# Patient Record
Sex: Male | Born: 1965 | Race: White | Hispanic: No | Marital: Married | Smoking: Never smoker
Health system: Southern US, Community
[De-identification: ages and names within clinical notes are randomized; demographics above are authoritative.]

## PROBLEM LIST (undated history)

## (undated) DIAGNOSIS — K219 Gastro-esophageal reflux disease without esophagitis: Secondary | ICD-10-CM

## (undated) DIAGNOSIS — R112 Nausea with vomiting, unspecified: Secondary | ICD-10-CM

## (undated) DIAGNOSIS — E785 Hyperlipidemia, unspecified: Secondary | ICD-10-CM

## (undated) DIAGNOSIS — M549 Dorsalgia, unspecified: Secondary | ICD-10-CM

## (undated) DIAGNOSIS — K449 Diaphragmatic hernia without obstruction or gangrene: Secondary | ICD-10-CM

## (undated) DIAGNOSIS — M5137 Other intervertebral disc degeneration, lumbosacral region: Secondary | ICD-10-CM

## (undated) DIAGNOSIS — J45909 Unspecified asthma, uncomplicated: Secondary | ICD-10-CM

## (undated) DIAGNOSIS — Z9889 Other specified postprocedural states: Secondary | ICD-10-CM

## (undated) DIAGNOSIS — IMO0002 Reserved for concepts with insufficient information to code with codable children: Secondary | ICD-10-CM

## (undated) HISTORY — DX: Hyperlipidemia, unspecified: E78.5

## (undated) HISTORY — PX: BACK SURGERY: SHX140

## (undated) HISTORY — DX: Diaphragmatic hernia without obstruction or gangrene: K44.9

## (undated) HISTORY — DX: Other intervertebral disc degeneration, lumbosacral region: M51.37

## (undated) HISTORY — PX: SHOULDER ARTHROSCOPY: SHX128

## (undated) HISTORY — PX: TONSILLECTOMY: SUR1361

## (undated) HISTORY — DX: Gastro-esophageal reflux disease without esophagitis: K21.9

## (undated) HISTORY — DX: Reserved for concepts with insufficient information to code with codable children: IMO0002

---

## 1992-11-22 HISTORY — PX: KNEE CARTILAGE SURGERY: SHX688

## 1996-11-22 HISTORY — PX: ANKLE ARTHROSCOPY: SUR85

## 2000-01-29 ENCOUNTER — Encounter: Admission: RE | Admit: 2000-01-29 | Discharge: 2000-04-28 | Payer: Self-pay | Admitting: Anesthesiology

## 2002-01-04 ENCOUNTER — Inpatient Hospital Stay (HOSPITAL_COMMUNITY): Admission: RE | Admit: 2002-01-04 | Discharge: 2002-01-05 | Payer: Self-pay | Admitting: Neurosurgery

## 2002-01-04 ENCOUNTER — Encounter: Payer: Self-pay | Admitting: Neurosurgery

## 2002-02-14 ENCOUNTER — Encounter: Payer: Self-pay | Admitting: Neurosurgery

## 2002-02-14 ENCOUNTER — Encounter: Admission: RE | Admit: 2002-02-14 | Discharge: 2002-02-14 | Payer: Self-pay | Admitting: Neurosurgery

## 2002-03-29 ENCOUNTER — Encounter: Admission: RE | Admit: 2002-03-29 | Discharge: 2002-03-29 | Payer: Self-pay | Admitting: Neurosurgery

## 2002-03-29 ENCOUNTER — Encounter: Payer: Self-pay | Admitting: Neurosurgery

## 2002-06-06 ENCOUNTER — Encounter: Payer: Self-pay | Admitting: Neurosurgery

## 2002-06-06 ENCOUNTER — Ambulatory Visit (HOSPITAL_COMMUNITY): Admission: RE | Admit: 2002-06-06 | Discharge: 2002-06-06 | Payer: Self-pay | Admitting: Neurosurgery

## 2003-09-26 ENCOUNTER — Encounter: Admission: RE | Admit: 2003-09-26 | Discharge: 2003-09-26 | Payer: Self-pay | Admitting: Neurosurgery

## 2003-10-11 ENCOUNTER — Encounter: Admission: RE | Admit: 2003-10-11 | Discharge: 2003-10-11 | Payer: Self-pay | Admitting: Neurosurgery

## 2003-11-04 ENCOUNTER — Encounter: Admission: RE | Admit: 2003-11-04 | Discharge: 2003-11-04 | Payer: Self-pay | Admitting: Neurosurgery

## 2004-01-08 ENCOUNTER — Encounter: Admission: RE | Admit: 2004-01-08 | Discharge: 2004-01-08 | Payer: Self-pay | Admitting: Neurosurgery

## 2004-05-19 ENCOUNTER — Encounter
Admission: RE | Admit: 2004-05-19 | Discharge: 2004-07-24 | Payer: Self-pay | Admitting: Physical Medicine and Rehabilitation

## 2004-07-24 ENCOUNTER — Encounter
Admission: RE | Admit: 2004-07-24 | Discharge: 2004-09-28 | Payer: Self-pay | Admitting: Physical Medicine and Rehabilitation

## 2004-07-29 ENCOUNTER — Ambulatory Visit: Payer: Self-pay | Admitting: Physical Medicine and Rehabilitation

## 2004-09-01 ENCOUNTER — Ambulatory Visit: Payer: Self-pay | Admitting: Anesthesiology

## 2004-09-11 ENCOUNTER — Ambulatory Visit: Payer: Self-pay | Admitting: Physical Medicine and Rehabilitation

## 2004-09-28 ENCOUNTER — Encounter: Admission: RE | Admit: 2004-09-28 | Discharge: 2004-12-09 | Payer: Self-pay | Admitting: Neurosurgery

## 2004-10-20 ENCOUNTER — Ambulatory Visit: Payer: Self-pay | Admitting: Anesthesiology

## 2004-12-09 ENCOUNTER — Encounter: Admission: RE | Admit: 2004-12-09 | Discharge: 2005-02-17 | Payer: Self-pay | Admitting: Family Medicine

## 2004-12-11 ENCOUNTER — Ambulatory Visit: Payer: Self-pay | Admitting: Physical Medicine and Rehabilitation

## 2005-01-22 ENCOUNTER — Ambulatory Visit: Payer: Self-pay | Admitting: Physical Medicine and Rehabilitation

## 2005-04-07 ENCOUNTER — Ambulatory Visit: Payer: Self-pay | Admitting: Physical Medicine and Rehabilitation

## 2005-04-20 ENCOUNTER — Ambulatory Visit: Payer: Self-pay | Admitting: Anesthesiology

## 2005-05-03 ENCOUNTER — Encounter
Admission: RE | Admit: 2005-05-03 | Discharge: 2005-08-01 | Payer: Self-pay | Admitting: Physical Medicine and Rehabilitation

## 2005-06-08 ENCOUNTER — Ambulatory Visit: Payer: Self-pay | Admitting: Physical Medicine and Rehabilitation

## 2005-07-27 ENCOUNTER — Ambulatory Visit: Payer: Self-pay | Admitting: Physical Medicine and Rehabilitation

## 2005-08-03 ENCOUNTER — Encounter: Admission: RE | Admit: 2005-08-03 | Discharge: 2005-08-03 | Payer: Self-pay | Admitting: Neurosurgery

## 2005-08-25 ENCOUNTER — Encounter
Admission: RE | Admit: 2005-08-25 | Discharge: 2005-11-23 | Payer: Self-pay | Admitting: Physical Medicine and Rehabilitation

## 2005-09-14 ENCOUNTER — Ambulatory Visit: Payer: Self-pay | Admitting: Family Medicine

## 2005-10-11 ENCOUNTER — Ambulatory Visit: Payer: Self-pay | Admitting: Family Medicine

## 2005-11-02 ENCOUNTER — Ambulatory Visit: Payer: Self-pay | Admitting: Family Medicine

## 2005-12-02 ENCOUNTER — Ambulatory Visit: Payer: Self-pay | Admitting: Family Medicine

## 2006-04-29 ENCOUNTER — Ambulatory Visit: Payer: Self-pay | Admitting: Family Medicine

## 2006-06-22 ENCOUNTER — Ambulatory Visit: Payer: Self-pay | Admitting: Family Medicine

## 2006-07-26 ENCOUNTER — Ambulatory Visit: Payer: Self-pay | Admitting: Family Medicine

## 2006-08-30 DIAGNOSIS — K449 Diaphragmatic hernia without obstruction or gangrene: Secondary | ICD-10-CM | POA: Insufficient documentation

## 2006-08-30 DIAGNOSIS — K219 Gastro-esophageal reflux disease without esophagitis: Secondary | ICD-10-CM

## 2006-08-30 DIAGNOSIS — E669 Obesity, unspecified: Secondary | ICD-10-CM

## 2006-08-30 HISTORY — DX: Gastro-esophageal reflux disease without esophagitis: K21.9

## 2006-08-30 HISTORY — DX: Diaphragmatic hernia without obstruction or gangrene: K44.9

## 2006-09-02 ENCOUNTER — Ambulatory Visit: Payer: Self-pay | Admitting: Family Medicine

## 2006-12-13 ENCOUNTER — Ambulatory Visit: Payer: Self-pay | Admitting: Family Medicine

## 2006-12-13 ENCOUNTER — Encounter: Payer: Self-pay | Admitting: Family Medicine

## 2007-05-01 ENCOUNTER — Telehealth: Payer: Self-pay | Admitting: Family Medicine

## 2007-05-23 ENCOUNTER — Telehealth (INDEPENDENT_AMBULATORY_CARE_PROVIDER_SITE_OTHER): Payer: Self-pay | Admitting: *Deleted

## 2007-05-24 ENCOUNTER — Ambulatory Visit: Payer: Self-pay | Admitting: Family Medicine

## 2007-06-06 ENCOUNTER — Telehealth: Payer: Self-pay | Admitting: Family Medicine

## 2007-06-07 ENCOUNTER — Encounter: Payer: Self-pay | Admitting: Family Medicine

## 2007-06-16 ENCOUNTER — Ambulatory Visit: Payer: Self-pay | Admitting: Physical Medicine & Rehabilitation

## 2007-07-14 ENCOUNTER — Encounter
Admission: RE | Admit: 2007-07-14 | Discharge: 2007-08-22 | Payer: Self-pay | Admitting: Physical Medicine & Rehabilitation

## 2007-09-11 ENCOUNTER — Telehealth: Payer: Self-pay | Admitting: Family Medicine

## 2007-09-15 ENCOUNTER — Telehealth: Payer: Self-pay | Admitting: Family Medicine

## 2007-12-28 ENCOUNTER — Ambulatory Visit: Payer: Self-pay | Admitting: Family Medicine

## 2007-12-28 DIAGNOSIS — M5137 Other intervertebral disc degeneration, lumbosacral region: Secondary | ICD-10-CM

## 2007-12-28 DIAGNOSIS — M51379 Other intervertebral disc degeneration, lumbosacral region without mention of lumbar back pain or lower extremity pain: Secondary | ICD-10-CM

## 2007-12-28 DIAGNOSIS — M5136 Other intervertebral disc degeneration, lumbar region: Secondary | ICD-10-CM

## 2007-12-28 HISTORY — DX: Other intervertebral disc degeneration, lumbosacral region: M51.37

## 2007-12-28 HISTORY — DX: Other intervertebral disc degeneration, lumbosacral region without mention of lumbar back pain or lower extremity pain: M51.379

## 2007-12-29 ENCOUNTER — Encounter: Payer: Self-pay | Admitting: Family Medicine

## 2007-12-29 LAB — CONVERTED CEMR LAB
ALT: 15 units/L (ref 0–53)
AST: 13 units/L (ref 0–37)
Albumin: 4.5 g/dL (ref 3.5–5.2)
Alkaline Phosphatase: 67 units/L (ref 39–117)
BUN: 22 mg/dL (ref 6–23)
CO2: 22 meq/L (ref 19–32)
Calcium: 9.3 mg/dL (ref 8.4–10.5)
Chloride: 107 meq/L (ref 96–112)
Cholesterol: 250 mg/dL — ABNORMAL HIGH (ref 0–200)
Creatinine, Ser: 1.09 mg/dL (ref 0.40–1.50)
Glucose, Bld: 100 mg/dL — ABNORMAL HIGH (ref 70–99)
HDL: 51 mg/dL (ref >39)
LDL Cholesterol: 158 mg/dL — ABNORMAL HIGH (ref 0–99)
Potassium: 4.4 meq/L (ref 3.5–5.3)
Sodium: 141 meq/L (ref 135–145)
Total Bilirubin: 0.5 mg/dL (ref 0.3–1.2)
Total CHOL/HDL Ratio: 4.9
Total Protein: 7 g/dL (ref 6.0–8.3)
Triglycerides: 204 mg/dL — ABNORMAL HIGH (ref <150)
VLDL: 41 mg/dL — ABNORMAL HIGH (ref 0–40)

## 2008-01-01 ENCOUNTER — Encounter: Payer: Self-pay | Admitting: Family Medicine

## 2008-01-01 ENCOUNTER — Telehealth (INDEPENDENT_AMBULATORY_CARE_PROVIDER_SITE_OTHER): Payer: Self-pay | Admitting: *Deleted

## 2008-01-08 ENCOUNTER — Encounter: Admission: RE | Admit: 2008-01-08 | Discharge: 2008-01-08 | Payer: Self-pay | Admitting: Family Medicine

## 2008-01-08 ENCOUNTER — Telehealth: Payer: Self-pay | Admitting: Family Medicine

## 2008-01-09 ENCOUNTER — Telehealth: Payer: Self-pay | Admitting: Family Medicine

## 2008-01-10 ENCOUNTER — Encounter: Payer: Self-pay | Admitting: Family Medicine

## 2008-01-15 ENCOUNTER — Telehealth: Payer: Self-pay | Admitting: Family Medicine

## 2008-01-19 ENCOUNTER — Ambulatory Visit: Payer: Self-pay | Admitting: Family Medicine

## 2008-01-19 DIAGNOSIS — E785 Hyperlipidemia, unspecified: Secondary | ICD-10-CM

## 2008-01-19 HISTORY — DX: Hyperlipidemia, unspecified: E78.5

## 2008-01-24 ENCOUNTER — Telehealth: Payer: Self-pay | Admitting: Family Medicine

## 2008-02-09 ENCOUNTER — Telehealth: Payer: Self-pay | Admitting: Family Medicine

## 2008-08-19 ENCOUNTER — Ambulatory Visit: Payer: Self-pay | Admitting: Family Medicine

## 2008-08-19 DIAGNOSIS — IMO0002 Reserved for concepts with insufficient information to code with codable children: Secondary | ICD-10-CM

## 2008-08-19 HISTORY — DX: Reserved for concepts with insufficient information to code with codable children: IMO0002

## 2008-08-22 ENCOUNTER — Encounter: Admission: RE | Admit: 2008-08-22 | Discharge: 2008-08-22 | Payer: Self-pay | Admitting: Family Medicine

## 2008-08-23 ENCOUNTER — Telehealth: Payer: Self-pay | Admitting: Family Medicine

## 2008-08-27 ENCOUNTER — Encounter: Payer: Self-pay | Admitting: Family Medicine

## 2008-08-28 ENCOUNTER — Telehealth: Payer: Self-pay | Admitting: Family Medicine

## 2008-09-02 ENCOUNTER — Ambulatory Visit: Payer: Self-pay | Admitting: Family Medicine

## 2008-09-02 ENCOUNTER — Emergency Department (HOSPITAL_COMMUNITY): Admission: EM | Admit: 2008-09-02 | Discharge: 2008-09-02 | Payer: Self-pay | Admitting: Emergency Medicine

## 2008-09-02 ENCOUNTER — Telehealth: Payer: Self-pay | Admitting: Family Medicine

## 2008-09-04 ENCOUNTER — Encounter: Payer: Self-pay | Admitting: Family Medicine

## 2008-09-05 ENCOUNTER — Telehealth: Payer: Self-pay | Admitting: Family Medicine

## 2008-10-21 ENCOUNTER — Telehealth (INDEPENDENT_AMBULATORY_CARE_PROVIDER_SITE_OTHER): Payer: Self-pay | Admitting: *Deleted

## 2008-11-23 ENCOUNTER — Ambulatory Visit: Payer: Self-pay | Admitting: Occupational Medicine

## 2009-01-06 ENCOUNTER — Encounter: Admission: RE | Admit: 2009-01-06 | Discharge: 2009-01-06 | Payer: Self-pay | Admitting: Family Medicine

## 2009-01-06 ENCOUNTER — Ambulatory Visit: Payer: Self-pay | Admitting: Family Medicine

## 2009-01-08 ENCOUNTER — Encounter: Payer: Self-pay | Admitting: Family Medicine

## 2009-01-09 ENCOUNTER — Telehealth: Payer: Self-pay | Admitting: Family Medicine

## 2010-06-05 ENCOUNTER — Ambulatory Visit: Payer: Self-pay | Admitting: Family Medicine

## 2010-12-12 ENCOUNTER — Encounter: Payer: Self-pay | Admitting: Anesthesiology

## 2010-12-13 ENCOUNTER — Encounter: Payer: Self-pay | Admitting: Physical Medicine and Rehabilitation

## 2010-12-13 ENCOUNTER — Encounter: Payer: Self-pay | Admitting: Neurosurgery

## 2010-12-13 ENCOUNTER — Encounter: Payer: Self-pay | Admitting: Family Medicine

## 2011-04-06 NOTE — Procedures (Signed)
NAMEBALDO, Bryan Harvey         ACCOUNT NO.:  [id number]   MEDICAL RECORD NO.:  [id number]          PATIENT TYPE:  REC   LOCATION:  [facility]                          FACILITY:  [facility]   PHYSICIAN:  Erick Colace, M.D.DATE OF BIRTH:  12/19/1965   DATE OF PROCEDURE:  07/17/2007  DATE OF DISCHARGE:                               OPERATIVE REPORT   This is bilateral L5 dorsal ramus injection, bilateral L4 medial branch  block, bilateral L3 medial branch block under fluoroscopic guidance.   INDICATION:  Lumbar facet mediated pain with partial good relief from  lumbar facet radiofrequency neurotomy, but this was over 2 years ago.     The pain is only partially responsive to medication management including  narcotic analgesics.   Informed consent was obtained after describing risks and benefits of the  procedure to the patient.  These include bleeding, bruising, infection,  loss of bowel and bladder function, temporary or permanent paralysis.  She elects to proceed and has given written consent.   The patient placed prone on fluoroscopy table.  Betadine prep, sterile  drape.  A 25-gauge, 1.5-inch needle was used to incise skin and  subcutaneous tissue with 1% lidocaine x2 mL.  Then, a 22-gauge 3.5-inch  spinal needle was inserted under fluoroscopic guidance, first targeting  left S1 SAP sacral ala junction.  Bone contact made, confirmed with  lateral imaging.  Omnipaque 180 x 0.5 mL demonstrated no intravascular  uptake.  Then 0.5 mL of a solution containing 1 mL of 4 mg/mL  dexamethasone and 2 mL of 2% MPF lidocaine were injected.  Then, the  left L5 SAP transverse process junction targeted.  Bone contact made,  confirmed with lateral imaging.  Omnipaque 180 x 0.5 mL demonstrated no  intravascular uptake, and 0.5 mL of dexamethasone/lidocaine solution  injected.  Then, the left L4 SAP transverse process junction targeted.  Bone contact made, confirmed with lateral imaging.   Omnipaque 180 x 0.5  mL demonstrated no intravascular uptake.  Then, 0.5 mL of the  dexamethasone/lidocaine solution injected.  This same procedure was  repeated on the right side using the same technique, equipment and  injectate.  The patient tolerated the procedure well.  Pre-injection  pain level 7/10.  Post-injection pain level 0/10.  Post injection  instructions given.  Return for radiofrequency neurotomy in 4 weeks.      Erick Colace, M.D.  Electronically Signed     AEK/MEDQ  D:  07/17/2007 13:41:41  T:  07/18/2007 08:36:16  Job:  [id number]

## 2011-04-09 NOTE — Procedures (Signed)
NAMEORLYN, ODONOGHUE         ACCOUNT NO.:  [id number]   MEDICAL RECORD NO.:  [id number]          PATIENT TYPE:  REC   LOCATION:  [facility]                          FACILITY:  [facility]   PHYSICIAN:  Celene Kras, MD        DATE OF BIRTH:  [birth year]   DATE OF PROCEDURE:  11/10/2004  DATE OF DISCHARGE:                                 OPERATIVE REPORT   PATIENT:  Bryan Harvey.   DATE OF BIRTH:  [birth year].   SURGEON:  Jewel Baize. Stevphen Rochester, M.D.   Shandon Burlingame [facility] to [facility], notably  improved to the right side; left side is most problematic. I am going to go  head and pursue with left sided radiofrequency neural ablation, as it is  warranted, demonstrated positive provocative block. Most problematic site is  improved. Will go ahead and address the left side. Risks, complications, and  options are reinforced. He wishes to proceed. His function is increased, he  has decreased his medication usage, and overall improved in his functional  parameters.   OBJECTIVE:  Diffuse paralumbar myofascial discomfort, pain over PSIS and  notable pain on extension, but he does not have any new neurological  findings, motor, sensory or reflexive.   IMPRESSION:  Degenerative spine disease, lumbar spine, facet syndrome.   PLAN:  Facet neurotomy, left side, L5-S1, 4-5, 3-4, 2-3 independent needle  access points under local anesthetic, 10-mm active tip. He has consented.   The patient was taken to the fluoroscopy suite and placed in the prone  position. Back prepped and draped in usual sterile fashion. Using a 22-gauge  RF needle, I advanced under direct fluoroscopic observation of the facet at  the medial branch L5-S1, 4-5, 3-4, 2-3 independent needle access points  under local anesthetic and confirmed placement. I appropriately stimulated  motor and sensory and reconfirmed needle placement at multiple points during  the procedure. I then injected 1 cc of  lidocaine 1% MPF at each level with a  total of 40 mg of Aristocort in divided doses.   Lesion is performed at 70 degrees for 70 seconds at each level.   He tolerated the procedure well. No complication from the procedure.  Appropriate recovery. Discharge instructions given. Lifestyle enhancements  reviewed.       HH/MEDQ  D:  11/10/2004 11:05:00  T:  11/11/2004 11:16:50  Job:  [id number]

## 2011-04-09 NOTE — Assessment & Plan Note (Signed)
MEDICAL RECORD #[id number]   REASON FOR VISIT:  Bryan Harvey is a 45 year old married white gentleman who  is being seen in our pain and rehabilitative clinic for chronic low-back  pain. He is status post bilateral decompressive laminectomy and  microdiskectomy and Ray cage at L5-S1.   He has undergone medial branch blocks of his lumbar spine by Dr. Stevphen Rochester over  the last several months. He has done well with these. He is back in today.  He has had some increased pain in the low-back area, gradually coming on. No  radiation into the buttock or the legs, not associated with any kind of  fever or chills. No problems controlling bowel or bladder. No new numbness,  tingling, weakness.   The patient's functional status remains quite active. He works full-time. He  has two young children he helps take care when he is home. We recently  decreased his medication. He was taking Topamax 50 mg twice a day as well as  Norco 10 mg up to five times a day. He was reduced over the last several  weeks to Norco 7.5 three times a day and Topamax 25 mg twice a day. He has  not done well with this reduction in his medications.   We spent about 25 minutes talking today, reviewing pain management, flare-up  protocols.   He is currently not engaging in any stabilization exercises at this point.  He never got a TENS unit for his low-back pain and he does not regularly use  the lumbar support when he is doing some bending and twisting/lifting type  activities.   He reports his average pain today as about a 7 on a scale of 10. Sleep is  fair and relief from medications is fair. Pain is described as constant and  aching. Functional status as noted above.   No change in medical, social, or family history since last visit.   PHYSICAL EXAMINATION:  Blood pressure 125/80, pulse 101, respirations 16,  97% saturated on room air.  He is a well-developed, well-nourished  gentleman. He is oriented x3. Affect is bright,  alert, cooperative,  pleasant. He has normal gait.   IMPRESSION:  1.  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  2.  Facet syndrome.  3.  Status post bilateral decompressive laminectomy with microdiskectomy,      Ray cage at L5-S1.   PLAN:  Will continue hydrocodone 7.5 three times a day. Will also continue  Topamax 25 mg one p.o. b.i.d. Will add Ultracet 100 mg extended-release one  tablet p.o. daily for 8 days. He was given samples for this.   He will call in over the next week and let us know if he would like [place] to  call this in for him. May also consider increasing Topamax to 50 mg twice a  day. Will get him started back into a spine stabilization program formally  in a physical therapy environment. Would like him to go once a week over the  next 6-8 weeks and continue his program  at home as well. Would like him to use a lumbar support during activities.  Would also like him assessed for a TENS unit. Will see him back in 1 month.       DMK/MedQ  D:  06/24/2005 13:40:18  T:  06/24/2005 14:15:33  Job #:  [id number]

## 2011-04-09 NOTE — Op Note (Signed)
[place]  Patient:    HILDING, QUINTANAR Visit Number: [id number] MRN: [id number]          Service Type: SUR Location: 3000 [id number] [id number] Attending Physician:  Gerald Dexter Dictated by:   Reinaldo Meeker, M.D. Proc. Date: 01/04/02 Admit Date:  01/04/2002                             Operative Report  PREOPERATIVE DIAGNOSIS:  Degenerative disk disease with herniated disk, L5-S1.  POSTOPERATIVE DIAGNOSIS:  Degenerative disk disease with herniated disk, L5-S1.  OPERATION PERFORMED:  Bilateral L5-S1 decompressive laminectomy followed by bilateral microdiskectomy followed by Ray cage interbody fusion with 12 x 26 mm Ray cages with local bone graft.  SURGEON:  Reinaldo Meeker, M.D.  ASSISTANT:  Julio Sicks, M.D.  ANESTHESIA:  DESCRIPTION OF PROCEDURE:  After being placed in the prone position, the patients back was prepped and draped in the usual sterile fashion. Localizing x-ray was taken prior to incision to identify the appropriate level.  A midline incision was made above the spinous processes of L5 and S1. Using the Bovie cutting current, the incision was carried down to the spinous processes.  Subperiosteal dissection was then carried out bilaterally on the spinous processes and lamina and the McCullough self-retaining retractor was placed for exposure.  X-ray confirmed approach to the appropriate level. Spinous processes and interspinous ligament were removed.  A high speed drill was then used to perform generous laminotomies bilaterally by removing the inferior two thirds of the L5 lamina, the medial two thirds of the facet joint and the superior one half of the S1 segment.  Residual bone was removed and saved for use in the cages at the end of the case.  The ligamentum flavum was removed in a piecemeal fashion.  The midline structures were removed at this time to complete the bilateral decompressive laminectomy.  At this  point microdissection technique was used bilaterally to clean out the L5-S1 disk and coagulate on the annulus, incising the disk and clean out thoroughly with pituitary rongeurs and curets.  A very thorough disk space clean-out was carried out.  At the same time great care was taken to avoid injury to the neural elements and this was successfully done.  At this point fluoroscopy was brought back into the case.  Under fluoroscopic guidance, a 14 mm Tang retractor was placed.  14 x 26 mm cages were placed after drilling and tapping under fluoroscopic guidance.  The cages were confirmed to be in excellent position by fluoroscopy bilaterally and there was no evidence of any injury to the neural elements.  At this point final fluoroscopy in the AP and lateral directions showed the cages to be in good position.  The wound was then irrigated copiously and any bleeding controlled with bipolar coagulation and Gelfoam.  The wound was then closed using interrupted Vicryl in the muscle, fascia, subcutaneous and subcuticular tissues and Dermabond and Steri-Strips on the skin.  A sterile dressing was then applied and the patient was extubated and taken to recovery room in stable condition. Dictated by:   Reinaldo Meeker, M.D. Attending Physician:  Gerald Dexter DD:  01/04/02 TD:  01/04/02 Job: [id number]
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
## 2011-04-09 NOTE — Assessment & Plan Note (Signed)
HISTORY OF PRESENT ILLNESS:  Bryan Harvey is a 45 year old married white  gentleman who is being seen in our pain and rehabilitation clinic for  chronic low back pain. He is status post bilateral decompressive laminectomy  and microdiskectomy at L5-S1. He has undergone medial branch block and facet  neurotomy by Dr. Stevphen Rochester over the last several months. He has done well with  this. He is back in today. He has some good days and some bad days. The pain  is typically located in the low back area. Worse with extension type of  activities and sometimes, worse with prolonged sitting. His average pain is  about a 7 on a scale of 10. He describes his pain as constant, dull,  stabbing, aching. His sleep is fair. His pain is worse with bending and  sitting type of activities. Improves with rest, medications, and injections.  Relief with medications currently is fair. He is quite functional. He is  independent with all of his self care. He is up a good part and most of the  day and driving with his job. 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  He stays quite active.   REVIEW OF SYSTEMS:  No changes.   PAST MEDICAL HISTORY:  No changes.   SOCIAL/FAMILY HISTORY:  No changes since last visit.   PHYSICAL EXAMINATION:  VITAL SIGNS:  Blood pressure is 149/88, pulse 99,  respiratory rate 18, 100% saturated on room air.  GENERAL:  A well developed, well nourished  gentleman. Does not appear in  any distress during our interview today. He moves easily in the room. He is  able to stand easily.  NEUROLOGIC:  Gait is non-antalgic. Heel toe walking is normal Tandem gait is  normal. Affect is bright, alert, cooperative and pleasant. Reflexes are  symmetric and intact in the lower extremities. Motor strength is excellent.  Straight leg raise is negative.   IMPRESSION:  1.  Degenerative disk of the lumbar spine.  2.  Facet syndrome.  3.  Facet plus decompressive laminectomy with microdiskectomy Ray cage  at L5-      S1.   PLAN:  Will refill hydrocodone 5/325 1 p.o. b.i.d. p.r.n. #60. Will continue  Topamax 25 mg 1 p.o. b.i.d. and will limit the amount of acetaminophen. I  went over this with him again, to less than 2,000 mg a day. He continue to  do physical therapy program. He had been going twice a week for about 6  visits, working out on lumbar stabilization program, education on Agricultural consultant. At this point, we would like him to maybe go just once  or twice a month now and to progress him as he gets stronger in therapy.  Will see him back in a month.           ______________________________  Bryan Harvey, M.D.     DMK/MedQ  D:  07/28/2005 15:31:46  T:  07/29/2005 02:17:51  Job #:  [id number]

## 2011-04-09 NOTE — Assessment & Plan Note (Signed)
DATE OF SERVICE:  Apr 07, 2005.   MEDICAL RECORD NUMBER:  [id number].   [name] is a 45 year old, married, white gentleman who is back in today  for a recheck and refill of his medications.   He is now about five months status post his last RF procedure by Dr. Stevphen Rochester.   He notes that his back pain seems to be worsening recently.   He has been very active.  He has been coaching soccer and baseball.  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  He has stayed very  active.  He has a 43-month-old baby and another child which he helps take  care of.   He admits that he is somewhat sleep deprived because of the baby.   His average pain is about a 6 on a scale of 10, worse with bending, sitting  and twisting-type activities.  Improves with rest, medications and  injections.   Relief from medications currently is fair.   MEDICATIONS:  1.  Norco 10/325 mg one p.o. q.i.d. #120.  2.  Topamax 25 mg one p.o. b.i.d. #60.  3.  Lidoderm p.r.n.  4.  Soma p.r.n.  5.  Naprosyn not more than 10 days a month.   Functional status otherwise is good.  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  He is able to drive and climb stairs without  difficulty.  He is functioning at a fairly high level currently.  No new  problems on health and history form and review of systems.   Denies any new changes in past medical, social or family history.   PHYSICAL EXAMINATION:  Blood pressure 130/97, pulse 90, respirations 16, 98%  saturated on room air.  He is a well-developed adult male in no apparent  distress.  He is oriented x 3.  Affect is bright and alert.  He is  appropriate and cooperative.  He is able to stand without any difficulty.  He appears a little bit stiff as he stands up today.  His gait is  nonantalgic.  Extension increases his pain quite a bit.  Lumbar extension  increases his pain quite a bit.  He has some limitations in lumbar range,  basically in  all planes, however.  Seated reflexes are 2+ at the knees and  ankles.  Toes are downgoing.  There is no clonus noted.  He has normal tone  throughout the lower extremities.  He has excellent strength in the lower  extremities, 5/5 at hip flexors, knee extensors, dorsiflexors, plantar  flexors, EHL and evertors.  Also, he has a negative straight leg raise and  normal sensory exam.   IMPRESSION:  1.  History of bilateral decompressive laminectomy, microdiskectomy and Ray      cage at L5-S1.  2.  Facet syndrome.  3.  Lumbago.   PLAN:  This gentleman continues to function at a very high level.  He has  been appropriate with his Norco and reports that Topamax does help with the  leg pain he gets intermittently.  With taking Topamax, he does not really  get any leg pain at all.   He has been using Naprosyn, Soma, TENS unit and Lidoderm about four days a  month for flare ups.  He has noted worsening of his low back pain recently.  He is asking whether or not it would be okay to undergo radiofrequency  again.  He felt  he got quite a bit of good relief with it.  His activity  level indeed did increase dramatically after his RF back in December.  I  will see him back in a month.  He will follow up with Dr. Stevphen Rochester for an  evaluation for repeat RF.      DMK/MedQ  D:  04/07/2005 14:03:10  T:  04/07/2005 14:50:00  Job #:  [id number]

## 2011-04-09 NOTE — Assessment & Plan Note (Signed)
The [facility] comes to [facility] today to evaluate and review  health and history form, 14-point review of systems.   Although, he has had some pain in the paralumbar position, I think this is  related more to his mechanical pain, and I do think he is a responder to  radiofrequency neuroablation.  His right side mechanical pain has all but  resolved, the site of the radiofrequency neuroablation, and so we are going  to go on to the left side which is problematic.  I have reviewed this with  him.   Another rationale is to minimize escalation of narcotic-based pain  medication.  He wants to interact with his baby, and be as functional as  possible.  He has been out of work for two weeks.  Dr. Trudee Grip next step  is a fusion, with hardware, and he wants to avoid this.   Soma and Lyrica is helping him, and will increase his analgesic capacity  until I can perform radiofrequency neuroablation.  He is most likely a  candidate for adhesiolysis as well.   He is a functional individual, very active and forthright individual.  I  have reviewed these medications, the risks of these medications.  He  understands and accepts.  Hopefully we can move the p.r.n. strategy or non-  narcotic medication alternatives over time.   Objectively, diffuse paralumbar myofascial discomfort primarily mechanical  pain, left greater than right with pain over PSIS.  Straight leg lift found  impaired and he is intact neurologically, motor, sensory, reflexes.   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   PLAN:  Facet neurotomy, left side.  We will see him in followup.  Discharge  instructions given.  Instructed to maintain contact with Dr. Gerlene Fee.       HH/MedQ  D:  10/20/2004 11:23:21  T:  10/20/2004 12:28:21  Job #:  [id number]   cc:   Reinaldo Meeker, M.D.  [address]  [place]  [place] [address]  Fax:  [phone]

## 2011-04-09 NOTE — Procedures (Signed)
NAMEHARVEER, Bryan Harvey         ACCOUNT NO.:  [id number]   MEDICAL RECORD NO.:  [id number]          PATIENT TYPE:  REC   LOCATION:  [facility]                          FACILITY:  [facility]   PHYSICIAN:  Celene Kras, MD        DATE OF BIRTH:  May 26, 1966   DATE OF PROCEDURE:  05/04/2005  DATE OF DISCHARGE:                                 OPERATIVE REPORT   PATIENT:  Bryan Harvey.   MEDICAL RECORD NUMBER:  [id number].   DATE OF BIRTH:  February 24, 1956.   SURGEON:  Jewel Baize. Stevphen Rochester, M.D.   Bryan [facility] to [facility].  I  evaluated him via the health and history form and 14-point review of  systems.   1.  Bryan Harvey comes to [place] today, stating he has had excellent relief      cycling from previous radiofrequency neural ablation and I reviewed the      chart and Dr. Leretha Dykes notes.  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   1.  Other lifestyle enhancements discussed, such as weight control.   1.  The right side seems to be most problematic.  Consider left-side      radiofrequency neural ablation reinforcement if need.  I will stay with      the most problematic side and I have reviewed the risks, complications      and options as we perform another RF to the right side to maintain      continued functional progress.  He will maintain contact with Dr.      Pamelia Hoit as well.  I do not think we need to do a bilateral RF today      and I think that the potential post procedural discomfort will probably      exceed the benefit.   OBJECTIVE:  Diffuse paralumbar myofascial discomfort with pain over the  PSIS, notable pain on extension and Gaenslen's and Patrick's equivocal. No  new neurological findings motor, sensory or reflexive.   IMPRESSION:  1.  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  2.  Facet syndrome.   PLAN:  Facet  neurotomy, right side, L5-S1, 4-5, 3-4 and 2-3 with  contributory innervation addressed.  He has consented.  As I related to him,  added biomechanical stress above the Ray cages and we would expect the  segments above the stabilization to be at risk and we may need to do the  left side if necessary.  Appropriate questions are answered.  No barriers to  communication.   He is consented.   PROCEDURE:  The patient was taken to the fluoroscopy suite and placed in the  prone position.  The back is prepped and draped in the usual fashion.  Using  a 22 gauge RF needle, 10 mm active tip, I advanced to the facet at the  medial branch, L5-S1, 4-5, 3-4 and 2-3, right side, independent needle  access points under local anesthetic.  I confirmed placement in  multiple  fluoroscopic positions.  I appropriately stimulate both motor and sensory  and reconfirm needle placement at multiple points during this procedure and  maintain verbal contact with the patient.   Lidocaine 1 mL 1% MPF is injected at each level with a total of 40 mg of  Aristocort in divided dose.   Lesion is performed at 60 degrees for 60 seconds at each level.   He tolerated this procedure well.  No complication from our procedure.  Appropriate recovery.  Improved at discharge.  Will assess him the context  of activities of daily living.       HH/MEDQ  D:  05/04/2005 10:33:07  T:  05/04/2005 11:44:46  Job:  [id number]

## 2011-04-09 NOTE — Assessment & Plan Note (Signed)
MEDICAL RECORD NUMBER:  [id number].   Bryan Harvey is back in for a second refill of his medications. He is a 45-  year-old married white gentleman who is status post bilateral decompressive  laminectomy, microdiskectomy, ray cage at L5-S1 by Dr. Gerlene Fee. He has  history of some facet arthritis and chronic low back pain and intermittent  bilateral trochanter bursitis.   He describes his average pain as about a 6 on a scale of 10. It is constant  and aching. Sleep is fair. He gets good relief with his medications  currently. Pain is worse with walking, bending, sitting, certain activities.  Pain improves with rest, heat, medications, injections.   He is able to walk about 20 minutes at a time. He is able to climb stairs  and drive. 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 He has  been doing quite a bit driving about 5 hours a day, total 25 hours a week of  driving. She is also coaching soccer and coaching baseball. He feels that  the RF did help somewhat. In fact, he probably increased his activity more  than he should have, doing all of the coaching and increasing the amount he  is walking and hours he is working. ______________ the gains he made with  the RF may have been blunted by his increased activity level. We received a  medication sheet from his insurance company stating that he has multiple  prescribers for hydrocodone propoxyphene over the last several months. On  questioning him on this, apparently he has had some problems with his right  great toe, has undergone some surgical procedures, has had infection, has  been treated with antibiotics for this, and he was prescribed 30 tablets in  January and about 90 tablets in February of a controlled substances from  other providers. We had nursing staff go over the narcotic contract with him  regarding this.   If this continues without our knowledge, we will need to consider having him  referred to another pain  clinic. He will be given a list of clinics he can  chose from.   PAST MEDICAL HISTORY:  Unchanged since last visit.   SOCIAL HISTORY:  Unchanged since last visit.   FAMILY HISTORY:  Unchanged since last visit.   PHYSICAL EXAMINATION:  Blood pressure 109/64, pulse 92, respirations 16, 97%  saturated on room air. He is a well-developed, mildly obese, gentleman who  appears his stated age. He is oriented. His affect is overall bright and  alert. He is cooperative. He sits forward flexed. His position of comfort is  when he is in a forward position. Any type of extension activity still  aggravates him. He is able to stand independently. Gait is normal in the  room. He has limitations with forward flexion as well as extension.  Otherwise reflexes are symmetrically intact. He has excellent strength in  the lower extremities. He has mild tenderness to palpation over the  trochanters; however, when he does extend back, he does have radiation of  pain into the lateral buttock area, and this is with extension. He gets a  neuropathic type pain into this area. I do not believe it is related to the  trochanteric bursitis. He has been in therapy for this.   IMPRESSION:  1.  Status post bilateral decompressive laminectomy, microdiskectomy, ray      cage L5-S1.  2.  Facet syndrome.  3.  Lumbago.  4.  Bilateral trochanteric bursitis,  mild.   We will have him finish up his physical therapy program with lower extremity  stretching. We will have him be seen briefly for a TENS evaluation and  trial. Would like to slowly decrease Norco over the next several months. We  will refill his Norco today, 10/325 one p.o. q.i.d. p.r.n. pain #120. Will  add Naprosyn 500 for flare ups 1 p.o. b.i.d. #60. Will taper his Lyrica 6  mg. He will take 2 tablets at night for the next 3 days and then 1 tablet at  night for the next 4 days after that. Then we will start him on some  Topamax. It may be less sedating. He  may find this more beneficial than the  Lyrica. Start him on 25 mg 1 p.o. q.h.s. #30, next month will increase him  to twice a day. We will add Lidoderm as well 5% one to three patches 12  hours on and 12 hours off #90.   I spent 20 minutes in the room today discussing importance of pacing his  activities and using good body mechanics and flare up protocol of  exacerbation of pain. We will see him back in one month.      DMK/MedQ  D:  02/19/2005 12:07:10  T:  02/19/2005 13:32:22  Job #:  [id number]

## 2011-04-09 NOTE — Assessment & Plan Note (Signed)
HISTORY OF PRESENT ILLNESS:  Bryan [facility] to evaluate Health and History form 14-point review of  systems.   1. We are about six months out of RF, improved function range of motion,      and quality of life indices.  He continues to work, modified schedule,      and he started coaching.  He attributes this to functional enhancements      with the facet neurotomy.  He has both an anterior and posterior      compartment element with post laminectomy syndrome, so we probably      staged this as a bilateral RF as he is less lateralizing more bilateral      pain, and then follow with adhesiolysis.  This may be our best      nonsurgical decision outside of a dorsi column stimulator.  I do not      think a stimulator is warranted at this time as he did not really have      much leg pain.  This is mostly mechanical and elements of post      laminectomy syndrome.  2. Do not believe further imaging or diagnostics are warranted.  3. Other lifestyle enhancements discussed.  Home based therapy reviewed.     OBJECTIVE:  Diffuse para lumbar myofascial discomfort and pain over PSIS,  notable pain on extension, straight leg lifting impaired, left and right.  EHL is fine but no new neurological findings on motor, sensory, reflexes.   IMPRESSION:  Degenerative spinal disease of the lumbar spine, post  laminectomy syndrome, facet syndrome.   PLAN:  1. As he is a responder, he has done very well with neurotomy.  It is      reasonable to go on and reinforce, this time bilaterally.  2. Review the risks, complications and options of this procedure.  3. Another rational performed.  Procedure is to minimize escalation of      controlled substances.  4. I will see him in follow up.  Discharge instructions given.  No barrier      to communication.        HH/MedQ  D:  04/20/2005 11:04:21  T:  04/20/2005 11:36:50  Job #:  [id number]

## 2011-04-09 NOTE — Assessment & Plan Note (Signed)
HISTORY OF PRESENT ILLNESS:  Mr. Garate is a 45 year old married gentleman  who is working full time at this time.   [facility].  He  has undergone a facet neurotomy on May 04, 2005.  He has done well after  this.  His pain scores are down to a 6 on a scale of 10.  He is back to most  of his functional activities, although, he is not playing golf at this  point.  He describes his pain as constant, dull and aching.  Sleep is fair.  Relief with medications is between fair and good at this point.   He is able to be up on his feet all day.  He is driving.  He can climb  stairs.  Working 40 hours a week, independent with all of his self care.  Denies bowel or bladder problems.  Denies suicidal ideations.   No changes in review of systems.   No change in past medical, social or family history since last visit.   PHYSICAL EXAMINATION:  VITAL SIGNS:  Blood pressure 128/74, pulse 100,  respirations 16, 98% saturated on room air.  GENERAL APPEARANCE:  Mr. Coker is a well-developed, well-nourished  gentleman, mildly obese.  He is oriented x3.  His affect is bright, alert,  cooperative, pleasant today.  He is able to stand without any difficulty.  Gait is entirely normal.  He has a fairly good range of motion in the lumbar  spine.  Some discomfort at end range with extension.  Seated reflexes are 2+  at the knees, 2+ at the ankles.  She has excellent motor strength in the  lower extremities.  Straight leg raising is negative.  No sensory deficits  with light touch.   IMPRESSION:  1.  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  2.  Facet syndrome.  3.  Status post bilateral decompressive laminectomy and microdiskectomy in      rib cage at L5-S1.   PLAN:  Mr. Payette is pretty much back to his regular functioning with the  exception of playing golf.  His pain scores are down.  He thinks he is ready  to come off of his  medications.  We will decrease his Topamax over the next  week to one tablet q.h.s. x7 days and then discontinue.  Will also decrease  his Norco from 10/325 one p.o. q.i.d. down to 7.5 one p.o. t.i.d. on a  p.r.n. basis, #90.  Will discontinue his Soma.  At this point, will also  have him return back to our clinic on a p.r.n. basis.  He typically gets  approximately six months relief with his facet neurotomy.  He may follow up  with Dr. Stevphen Rochester directly at his next visit.  He will call and let us known.  Will see him back then p.r.n.       DMK/MedQ  D:  06/10/2005 10:09:10  T:  06/10/2005 12:37:19  Job #:  [id number]

## 2011-04-09 NOTE — Procedures (Signed)
NAMEKELECHI, ORGERON         ACCOUNT NO.:  [id number]   MEDICAL RECORD NO.:  [id number]          PATIENT TYPE:  REC   LOCATION:  [facility]                          FACILITY:  [facility]   PHYSICIAN:  Celene Kras, MD        DATE OF BIRTH:  [birth year]   DATE OF PROCEDURE:  09/01/2004  DATE OF DISCHARGE:                                 OPERATIVE REPORT   PATIENT:  Bryan Harvey.   DATE OF BIRTH:  February 25, 1956.   SURGEON:  Jewel Baize. Stevphen Rochester, M.D.   MEDICAL RECORD NUMBER:  [id number].   [name] [facility] to [facility].  I  evaluated him via health and history form and 14-point review of systems.   1.  The most problematic side is the right side.  We plan radiofrequency      neural ablation after positive provocative block yielded significant      improvement in function, range of motion and quality of life indices,      better endurance, less pain and less medication usage patterns.   1.  Other lifestyle enhancements discussed, such as weight control and home      based therapy.  Consider the contralateral side based on need.   1.  The risks of this procedure, including bleeding, infection, nerve      damage, stroke, seizure, death, neuritis, no relief of pain, increase in      pain and other unpredictable issues related to the medication, were      discussed.   He is consented and wishes to proceed.  No interval changes in neurologic or  musculoskeletal presentation.   OBJECTIVE:  Diffuse paralumbar myofascial discomfort.  Pain over PSIS.  Notable pain on extension, right greater than left.  No new neurological  findings motor, sensory or reflexive.   IMPRESSION:  1.  Degenerative spine disease of lumbar spine.  2.  Facet syndrome.   PLAN:  Facet neurotomy under local anesthetic with 10 mm active tip.  He is  consent.   PROCEDURE:  The patient was taken to the fluoroscopic suite and placed in  the prone position.  His back was prepped and  draped in the usual fashion.  Using a 22 gauge RF needle, I advanced under direct fluoroscopic observation  of the facet at the medial branch, L5-S1, 4-5, 3-4 and 2-3.  Contributory  enervation addressed.  Independent needle access points under local  anesthetic.  Confirmed placement in multiple fluoroscopic positions.  Used  Isovue 200 to appropriately stimulate motor and sensory followed with 1 mL  of lidocaine MPF at each level with a total of 40 mg of Aristocort in  divided dose.   Lesion performed at 70 degrees for 70 seconds at each level.   The patient tolerated the procedure well.  No complications from the  procedure.  Appropriate recovery.  Discharge instructions given.  No  complications identified.  __________ followup.       HH/MEDQ  D:  09/01/2004 09:34:14  T:  09/01/2004 16:11:26  Job:  [id number]

## 2011-04-09 NOTE — Procedures (Signed)
NAME:  DEMAURI, ADVINCULA                   ACCOUNT NO.:  [id number]   MEDICAL RECORD NO.:  [id number]                   PATIENT TYPE:  REC   LOCATION:  [facility]                                  FACILITY:  [facility]   PHYSICIAN:  Celene Kras, MD                     DATE OF BIRTH:  13-Apr-1966   DATE OF PROCEDURE:  07/07/2004  DATE OF DISCHARGE:                                 OPERATIVE REPORT   PATIENT:  Bryan Harvey.   DATE OF BIRTH:  Jul 31, 1966.   SURGEON:  Jewel Baize. Stevphen Rochester, M.D.   B[facility] and I  evaluated him. I have reviewed the Health and history form. I reviewed the  14 point review of systems.   Bryan Harvey has clearly demonstrated positive provocative block with  improvement in function and quality of life indices to the facet at the  medial branch. He is starting to recrudesce prior to moving to consideration  of radiofrequency neural ablation. We will look forward to giving at least 2  injections to assess functional enhancement. We plan L5-S1, 4-5, 3-4,  contributory innervation addressed, right and left side independent needle  access points under local anesthetic. I discussed him with Dr. Pamelia Hoit,  and she assists in the procedure.   I also discussed overall direct care approach with him. He will assess  within the context of activities of daily living.   OBJECTIVE:  Diffuse paralumbar myofascial discomfort, no significant change  in neurological or musculoskeletal presentation. Gaenslen's and Patrick's  equivocal.   IMPRESSION:  Degenerative spine disease of lumbar spine, facet syndrome.   PLAN:  Facet injection, L5-S1, 4-5, 3-4, right and left side, under local  anesthetic, at the medial branch. Independent needle access points. He is  consented.   The patient taken to the fluoroscopy suite, placed in prone position. Back  prepped and draped in the usual fashion. Using a 22-gauge needle, I  advanced  through the facet at the medial branch with a local anesthetic at L5-S1, 4-  5, 3-4, right and left side; independent needle access points. Confirmed  placement. I then injected 1 cc of lidocaine 1% MVP at each level, a total  of 40 mg of Aristocort in divided doses.   Tolerated the procedure well. No complications from the procedure.  Appropriate recovery. Improved at discharge. Discharge instructions given.                                               Celene Kras, MD   HH/MEDQ  D:  07/07/2004 12:13:50  T:  07/08/2004 11:15:07  Job:  [id number]

## 2011-04-09 NOTE — Assessment & Plan Note (Signed)
MEDICAL RECORD NUMBER:  [id number]   DATE OF SERVICE:  December 11, 2004   REASON FOR EVALUATION:  Mr. Mundorf is back in.  He has had bilateral facet  neurotomy over the last several months.  His pain is about a 7 on a scale of  10, still worse with extension, but overall he feels he might be somewhat  better.  He has been taking Roxicodone 50 mg 4 times a day, also Lyrica,  which seems to be helpful; he takes it only in the evening as it makes him  too sedated during the day.  He has also been on Soma.   He denies any problems controlling bowel or bladder.  He denies any suicidal  ideation.   He continues to stay quite functional, the father of 2 children, a 58-month-  old and a 36-year-old.  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  He is able to climb stairs.  He is  currently driving.   He reports his pain to be located in the central low back area, worse with  extension, average pain 7 on a scale of 10, constant and aching, worse in  the evening and morning for him, moderately interferes with his activities  __________ .  He gets good relief from his pain medications at this time.   No changes in his past medical, surgical or family history.   PHYSICAL EXAMINATION:  VITAL SIGNS:  On exam today, blood pressure 118/70,  pulse 88, respirations 20, 98% saturated on room air.  GENERAL:  He is an  alert, oriented, pleasant gentleman, bright in his affect, oriented x3.  NEUROMUSCULAR:  He is able to come off of the exam table; he appears a bit  stiff initially as he gets up and his gait in the room is __________ ,  however, nonantalgic.  He is able to flex forward with mild limitations in  his range.  Extension does increase his back pain dramatically for him.  Seated reflexes are 2+ at the knees, 2+ at the ankles.  Straight leg raise  is negative.  His motor strength is excellent, 5/5 in hip flexors, knee  extensors, wrist flexors,  plantarflexors and EHL.   IMPRESSION:  1.  Status post bilateral decompressive laminectomy and microdiskectomy, Ray      cage at L5-S1, by Dr. Reinaldo Meeker.  2.  Facet syndrome.  3.  Lumbago.   PLAN:  We will discontinue his Roxicodone today, switch him to Kadian 20 mg  1 p.o. q.12 h., #30, Norco 7.5/325 mg 1 p.o. t.i.d. to 4 times daily p.r.n.;  I will give him 56 of those and 30 of the Kadian.  We will see him back in 2  weeks.  We would like to continue to decrease his narcotic pain medications  over the next couple of months.  We will also get him involved in a physical  therapy program which was written for today, 1-3 weeks to 4-6 weeks, lumbar  stabilization, lower extremity flexibility and strengthening, cardiovascular  endurance, maintaining a neutral lumbar spine.  We would like to send a copy  of our note over to Dr. Gerlene Fee.  We will see Mr. Shaddock back in 2  weeks to check his progress.  Most likely, we will reduce his narcotic at  that time.  Cautions are given to him regarding driving and making major  cognitive decisions while taking opioids.  DMK/MedQ  D:  12/11/2004 10:06:50  T:  12/11/2004 10:49:14  Job #:  [id number]   cc:   Reinaldo Meeker, M.D.  [address]  [place]  [place] [address]  Fax: [phone]

## 2011-04-09 NOTE — Assessment & Plan Note (Signed)
MEDICAL RECORD NUMBER:  [id number].   Bryan Harvey is a 45 year old married white gentleman who is being seen in  our pain and rehabilitative clinic for chronic low back pain. He has  undergone radiofrequency bilaterally by Dr. Stevphen Rochester.   Mr. Gracie tells me he continues to have some low back pain. His pain is  variable in the middle of his low back. He describes pain as being about a 6  to 8 on a scale of 10. He also has some lateral thigh pain which he  describes as about a 6 to 8 on a scale of 10. Pain specifically waxes and  wanes throughout the day. In the morning, it is about a 6 to 7; at lunch  time, it is down a little bit; starting to get back toward the evening and  is worse for him in the evening. He gets fair relief from his current  medications. He sleeps fairly well.   No new changes in bowel or bladder. No new numbness, tingling, or weakness.  No suicidal ideation.   PHYSICAL EXAMINATION:  On exam today, he has a blood pressure of 129/74,  respirations of 20, 97% saturated on room air.   His affect is overall bright and alert. He is oriented x3. He is  cooperative. He is able to stand independently from a seated position. His  gait is nonantalgic. He had limitations of forward flexion and extension.  Seated reflexes are 2+ at the patellar tendons, 2+ at the Achilles tendon.  Straight leg raise is negative. He has 5/5 strength at hip flexors, knee  extensors, dorsi flexors, plantar flexors, EHL. He has quite a bit of  tenderness over both trochanters.   IMPRESSION:  1.  Status post bilateral decompressive laminectomy microdiskectomy ray cage      at L5-S1 by Dr. Aliene Beams.  2.  Facet syndrome.  3.  Lumbago.  4.  Bilateral trochanteric bursitis.   PLAN:  Will discontinue his Kadian and start him on Norco 10/325 one p.o.  q.6h. #120. He will take 5 a day for the first week, then 4 a day for week  2, and then 3 a day for the next 2 weeks after that. We will see him  back in  a month. We will also get him involved in physical therapy program to  address the iliotibial tightness as well as trochanteric bursitis with an  ultrasound, stretching program, home program keeping in mind that he needs  to maintain neutral lumbar spine position during these stretching maneuvers.  We will see Bryan Harvey back in a month.      DMK/MedQ  D:  01/22/2005 12:13:38  T:  01/22/2005 15:41:41  Job #:  [id number]

## 2011-05-12 ENCOUNTER — Emergency Department (INDEPENDENT_AMBULATORY_CARE_PROVIDER_SITE_OTHER): Payer: Self-pay

## 2011-05-12 ENCOUNTER — Emergency Department (HOSPITAL_BASED_OUTPATIENT_CLINIC_OR_DEPARTMENT_OTHER)
Admission: EM | Admit: 2011-05-12 | Discharge: 2011-05-12 | Disposition: A | Discharge status: Home / Self Care | Payer: Self-pay | Attending: Emergency Medicine | Admitting: Emergency Medicine

## 2011-05-12 DIAGNOSIS — M76899 Other specified enthesopathies of unspecified lower limb, excluding foot: Secondary | ICD-10-CM | POA: Insufficient documentation

## 2011-05-12 DIAGNOSIS — M25559 Pain in unspecified hip: Secondary | ICD-10-CM

## 2011-05-12 DIAGNOSIS — G8929 Other chronic pain: Secondary | ICD-10-CM | POA: Insufficient documentation

## 2012-04-13 ENCOUNTER — Emergency Department (HOSPITAL_BASED_OUTPATIENT_CLINIC_OR_DEPARTMENT_OTHER)
Admission: EM | Admit: 2012-04-13 | Discharge: 2012-04-13 | Disposition: A | Discharge status: Home / Self Care | Payer: Self-pay | Attending: Emergency Medicine | Admitting: Emergency Medicine

## 2012-04-13 ENCOUNTER — Encounter (HOSPITAL_BASED_OUTPATIENT_CLINIC_OR_DEPARTMENT_OTHER): Payer: Self-pay | Admitting: Family Medicine

## 2012-04-13 DIAGNOSIS — M545 Low back pain, unspecified: Secondary | ICD-10-CM | POA: Insufficient documentation

## 2012-04-13 DIAGNOSIS — M5430 Sciatica, unspecified side: Secondary | ICD-10-CM | POA: Insufficient documentation

## 2012-04-13 DIAGNOSIS — S335XXA Sprain of ligaments of lumbar spine, initial encounter: Secondary | ICD-10-CM | POA: Insufficient documentation

## 2012-04-13 DIAGNOSIS — M79609 Pain in unspecified limb: Secondary | ICD-10-CM | POA: Insufficient documentation

## 2012-04-13 DIAGNOSIS — S39012A Strain of muscle, fascia and tendon of lower back, initial encounter: Secondary | ICD-10-CM

## 2012-04-13 DIAGNOSIS — M5432 Sciatica, left side: Secondary | ICD-10-CM

## 2012-04-13 DIAGNOSIS — X58XXXA Exposure to other specified factors, initial encounter: Secondary | ICD-10-CM | POA: Insufficient documentation

## 2012-04-13 DIAGNOSIS — K219 Gastro-esophageal reflux disease without esophagitis: Secondary | ICD-10-CM | POA: Insufficient documentation

## 2012-04-13 HISTORY — DX: Dorsalgia, unspecified: M54.9

## 2012-04-13 HISTORY — DX: Gastro-esophageal reflux disease without esophagitis: K21.9

## 2012-04-13 MED ORDER — HYDROMORPHONE HCL PF 1 MG/ML IJ SOLN
1.0000 mg | Freq: Once | INTRAMUSCULAR | Status: AC
  Administered 2012-04-13: 1 mg via INTRAMUSCULAR
  Filled 2012-04-13: qty 1

## 2012-04-13 MED ORDER — KETOROLAC TROMETHAMINE 60 MG/2ML IM SOLN
60.0000 mg | Freq: Once | INTRAMUSCULAR | Status: AC
  Administered 2012-04-13: 60 mg via INTRAMUSCULAR
  Filled 2012-04-13: qty 2

## 2012-04-13 MED ORDER — CYCLOBENZAPRINE HCL 10 MG PO TABS: 10.0000 mg | ORAL_TABLET | Freq: Two times a day (BID) | ORAL | Status: AC | PRN

## 2012-04-13 MED ORDER — HYDROCODONE-ACETAMINOPHEN 5-325 MG PO TABS: 1.0000 | ORAL_TABLET | ORAL | Status: AC | PRN

## 2012-04-13 MED ORDER — DIAZEPAM 5 MG PO TABS
5.0000 mg | ORAL_TABLET | Freq: Once | ORAL | Status: AC
  Administered 2012-04-13: 5 mg via ORAL
  Filled 2012-04-13: qty 1

## 2012-04-13 MED ORDER — PREDNISONE 50 MG PO TABS: 50.0000 mg | ORAL_TABLET | Freq: Every day | ORAL | Status: AC

## 2012-04-13 MED ORDER — PREDNISONE 50 MG PO TABS
60.0000 mg | ORAL_TABLET | Freq: Once | ORAL | Status: AC
  Administered 2012-04-13: 60 mg via ORAL
  Filled 2012-04-13: qty 1

## 2012-04-13 NOTE — Discharge Instructions (Signed)
Sciatica Sciatica is a weakness and/or changes in sensation (tingling, jolts, hot and cold, numbness) along the path the sciatic nerve travels. Irritation or damage to lumbar nerve roots is often also referred to as lumbar radiculopathy.  Lumbar radiculopathy (Sciatica) is the most common form of this problem. Radiculopathy can occur in any of the nerves coming out of the spinal cord. The problems caused depend on which nerves are involved. The sciatic nerve is the large nerve supplying the branches of nerves going from the hip to the toes. It often causes a numbness or weakness in the skin and/or muscles that the sciatic nerve serves. It also may cause symptoms (problems) of pain, burning, tingling, or electric shock-like feelings in the path of this nerve. This usually comes from injury to the fibers that make up the sciatic nerve. Some of these symptoms are low back pain and/or unpleasant feelings in the following areas:  From the mid-buttock down the back of the leg to the back of the knee.   And/or the outside of the calf and top of the foot.   And/or behind the inner ankle to the sole of the foot.  CAUSES   Herniated or slipped disc. Discs are the little cushions between the bones in the back.   Pressure by the piriformis muscle in the buttock on the sciatic nerve (Piriformis Syndrome).   Misalignment of the bones in the lower back and buttocks (Sacroiliac Joint Derangement).   Narrowing of the spinal canal that puts pressure on or pinches the fibers that make up the sciatic nerve.   A slipped vertebra that is out of line with those above or beneath it.   Abnormality of the nervous system itself so that nerve fibers do not transmit signals properly, especially to feet and calves (neuropathy).   Tumor (this is rare).  Your caregiver can usually determine the cause of your sciatica and begin the treatment most likely to help you. TREATMENT  Taking over-the-counter painkillers, physical  therapy, rest, exercise, spinal manipulation, and injections of anesthetics and/or steroids may be used. Surgery, acupuncture, and Yoga can also be effective. Mind over matter techniques, mental imagery, and changing factors such as your bed, chair, desk height, posture, and activities are other treatments that may be helpful. You and your caregiver can help determine what is best for you. With proper diagnosis, the cause of most sciatica can be identified and removed. Communication and cooperation between your caregiver and you is essential. If you are not successful immediately, do not be discouraged. With time, a proper treatment can be found that will make you comfortable. HOME CARE INSTRUCTIONS   If the pain is coming from a problem in the back, applying ice to that area for 15 to 20 minutes, 3 to 4 times per day while awake, may be helpful. Put the ice in a plastic bag. Place a towel between the bag of ice and your skin.   You may exercise or perform your usual activities if these do not aggravate your pain, or as suggested by your caregiver.   Only take over-the-counter or prescription medicines for pain, discomfort, or fever as directed by your caregiver.   If your caregiver has given you a follow-up appointment, it is very important to keep that appointment. Not keeping the appointment could result in a chronic or permanent injury, pain, and disability. If there is any problem keeping the appointment, you must call back to this facility for assistance.  SEEK IMMEDIATE MEDICAL CARE   facility for assistance.  SEEK IMMEDIATE MEDICAL CARE IF:    You experience loss of control of bowel or bladder.   You have increasing weakness in the trunk, buttocks, or legs.   There is numbness in any areas from the hip down to the toes.   You have difficulty walking or keeping your balance.   You have any of the above, with fever or forceful vomiting.  Document Released: 11/02/2001 Document Revised: 10/28/2011 Document Reviewed: 06/21/2008  Exit[employer] Patient Information 2012  [employer]    Lumbosacral Strain  Lumbosacral strain is one of the most common causes of back pain. There are many causes of back pain. Most are not serious conditions.  CAUSES   Your backbone (spinal column) is made up of 24 main vertebral bodies, the sacrum, and the coccyx. These are held together by muscles and tough, fibrous tissue (ligaments). Nerve roots pass through the openings between the vertebrae. A sudden move or injury to the back may cause injury to, or pressure on, these nerves. This may result in localized back pain or pain movement (radiation) into the buttocks, down the leg, and into the foot. Sharp, shooting pain from the buttock down the back of the leg (sciatica) is frequently associated with a ruptured (herniated) disk. Pain may be caused by muscle spasm alone.  Your caregiver can often find the cause of your pain by the details of your symptoms and an exam. In some cases, you may need tests (such as X-rays). Your caregiver will work with you to decide if any tests are needed based on your specific exam.  HOME CARE INSTRUCTIONS    Avoid an underactive lifestyle. Active exercise, as directed by your caregiver, is your greatest weapon against back pain.   Avoid hard physical activities (tennis, racquetball, waterskiing) if you are not in proper physical condition for it. This may aggravate or create problems.   If you have a back problem, avoid sports requiring sudden body movements. Swimming and walking are generally safer activities.   Maintain good posture.   Avoid becoming overweight (obese).   Use bed rest for only the most extreme, sudden (acute) episode. Your caregiver will help you determine how much bed rest is necessary.   For acute conditions, you may put ice on the injured area.   Put ice in a plastic bag.   Place a towel between your skin and the bag.   Leave the ice on for 15 to 20 minutes at a time, every 2 hours, or as needed.   After you are improved and more  active, it may help to apply heat for 30 minutes before activities.  See your caregiver if you are having pain that lasts longer than expected. Your caregiver can advise appropriate exercises or therapy if needed. With conditioning, most back problems can be avoided.  SEEK IMMEDIATE MEDICAL CARE IF:    You have numbness, tingling, weakness, or problems with the use of your arms or legs.   You experience severe back pain not relieved with medicines.   There is a change in bowel or bladder control.   You have increasing pain in any area of the body, including your belly (abdomen).   You notice shortness of breath, dizziness, or feel faint.   You feel sick to your stomach (nauseous), are throwing up (vomiting), or become sweaty.   You notice discoloration of your toes or legs, or your feet get very cold.   Your back pain is getting worse.     You have a fever.  MAKE SURE YOU:    Understand these instructions.   Will watch your condition.   Will get help right away if you are not doing well or get worse.  Document Released: 08/18/2005 Document Revised: 10/28/2011 Document Reviewed: 02/07/2009  [employer] Patient Information 2012 [employer]

## 2012-04-13 NOTE — ED Provider Notes (Addendum)
History     CSN: [id number]  Arrival date & time 04/13/12  1401   First MD Initiated Contact with Patient 04/13/12 1507      Chief Complaint  Patient presents with  . Back Pain    (Consider location/radiation/quality/duration/timing/severity/associated sxs/prior treatment) HPI Comments: Patient states the onset of left lower back pain radiating down his hip since yesterday.  He's been doing an increased amount of yard work in heating and notes that he feels like his back has a "catch".  He feels as though he is stuck.  The pain does radiate down his hip and down the lateral aspect of his left thigh.  He is able to ambulate.  No fevers.  No abdominal pain, nausea, vomiting or changes in bowel habits.  Patient does have a history of 3 lumbar surgeries and had similar symptoms in the past.  Patient is a 46 y.o. male presenting with back pain. The history is provided by the patient. No language interpreter was used.  Back Pain  This is a recurrent problem. Pertinent negatives include no chest pain, no fever, no headaches and no abdominal pain.    Past Medical History  Diagnosis Date  . Back pain   . GERD (gastroesophageal reflux disease)     Past Surgical History  Procedure Date  . Back surgery     History reviewed. No pertinent family history.  History  Substance Use Topics  . Smoking status: Never Smoker   . Smokeless tobacco: Not on file  . Alcohol Use: Yes      Review of Systems  Constitutional: Negative.  Negative for fever and chills.  HENT: Negative.   Eyes: Negative.  Negative for discharge and redness.  Respiratory: Negative.  Negative for cough and shortness of breath.   Cardiovascular: Negative.  Negative for chest pain.  Gastrointestinal: Negative.  Negative for nausea, vomiting and abdominal pain.  Genitourinary: Negative.  Negative for hematuria.  Musculoskeletal: Positive for back pain.  Skin: Negative.  Negative for color change and rash.    Neurological: Negative for syncope and headaches.  Hematological: Negative.  Negative for adenopathy.  Psychiatric/Behavioral: Negative.  Negative for confusion.  All other systems reviewed and are negative.    Allergies  Review of patient's allergies indicates no known allergies.  Home Medications  No current outpatient prescriptions on file.  BP 104/82  Pulse 122  Temp(Src) 98.3 F (36.8 C) (Oral)  Resp 20  SpO2 98%  Physical Exam  Nursing note and vitals reviewed. Constitutional: He is oriented to person, place, and time. He appears well-developed and well-nourished.  Non-toxic appearance. He does not have a sickly appearance.  HENT:  Head: Normocephalic and atraumatic.  Eyes: Conjunctivae, EOM and lids are normal. Pupils are equal, round, and reactive to light.  Neck: Trachea normal, normal range of motion and full passive range of motion without pain. Neck supple.  Cardiovascular: Normal rate, regular rhythm and normal heart sounds.   Pulmonary/Chest: Effort normal and breath sounds normal. No respiratory distress. He has no wheezes.  Abdominal: Soft. Normal appearance. He exhibits no distension. There is no tenderness. There is no rebound and no CVA tenderness.  Musculoskeletal: Normal range of motion.       Sensation to light touch is symmetric in both lower legs.  Patient has good plantar and dorsi flexion of his ankles.  No pain to palpation of the left hip.  No pain to palpation of the lumbar spine.  There is tenderness to palpation of  the left iliac crest.  Neurological: He is alert and oriented to person, place, and time. He has normal strength.  Skin: Skin is warm, dry and intact. No rash noted.  Psychiatric: He has a normal mood and affect. His behavior is normal. Thought content normal.    ED Course  Procedures (including critical care time)  Labs Reviewed - No data to display No results found.   No diagnosis found.    MDM  Patient with likely lumbar  strain with mild sciatica symptoms.  Patient has no neurologic deficit at this time.  He is able to ambulate.  Will treat the patient's symptoms with NSAIDs, muscle relaxants, steroids and narcotic pain medication.  Patient will receive medications here and prescriptions for home.  He does have a primary care physician in [place] who he can see for followup next week if his symptoms are not improving.        Nat Christen, MD 04/13/12 1528  4:14 PM Patient symptoms are improved and patient is now ready for discharge.  Nat Christen, MD 04/13/12 [id number]

## 2012-04-13 NOTE — ED Notes (Signed)
Pt c/o left low back pain after working in the yard yesterday. Pt sts pain shooting down left buttock and sts "it feels like I'm stuck".

## 2012-05-04 ENCOUNTER — Emergency Department (HOSPITAL_BASED_OUTPATIENT_CLINIC_OR_DEPARTMENT_OTHER): Payer: Self-pay

## 2012-05-04 ENCOUNTER — Emergency Department (HOSPITAL_BASED_OUTPATIENT_CLINIC_OR_DEPARTMENT_OTHER)
Admission: EM | Admit: 2012-05-04 | Discharge: 2012-05-04 | Disposition: A | Discharge status: Home / Self Care | Payer: Self-pay | Attending: Emergency Medicine | Admitting: Emergency Medicine

## 2012-05-04 ENCOUNTER — Encounter (HOSPITAL_BASED_OUTPATIENT_CLINIC_OR_DEPARTMENT_OTHER): Payer: Self-pay | Admitting: Family Medicine

## 2012-05-04 DIAGNOSIS — K219 Gastro-esophageal reflux disease without esophagitis: Secondary | ICD-10-CM | POA: Insufficient documentation

## 2012-05-04 DIAGNOSIS — M25559 Pain in unspecified hip: Secondary | ICD-10-CM | POA: Insufficient documentation

## 2012-05-04 DIAGNOSIS — M549 Dorsalgia, unspecified: Secondary | ICD-10-CM | POA: Insufficient documentation

## 2012-05-04 MED ORDER — PREDNISONE 10 MG PO TABS: 20.0000 mg | ORAL_TABLET | Freq: Every day | ORAL | Status: DC

## 2012-05-04 NOTE — ED Provider Notes (Signed)
History     CSN: [id number]  Arrival date & time 05/04/12  4098   First MD Initiated Contact with Patient 05/04/12 1052      Chief Complaint  Patient presents with  . Hip Pain    (Consider location/radiation/quality/duration/timing/severity/associated sxs/prior treatment) HPI Patient complaining of right hip pain that began during the night last night. He states he has had bursitis in his hip before and received an injection. He denies any redness or recent trauma. He denies any other medical problems except for reflux. He states he said it takes only medicine for his reflux. He denies any allergies. He has not had any fever, redness, rash, history of diabetes or joint infection. Past Medical History  Diagnosis Date  . Back pain   . GERD (gastroesophageal reflux disease)     Past Surgical History  Procedure Date  . Back surgery     No family history on file.  History  Substance Use Topics  . Smoking status: Never Smoker   . Smokeless tobacco: Not on file  . Alcohol Use: Yes      Review of Systems  All other systems reviewed and are negative.    Allergies  Review of patient's allergies indicates no known allergies.  Home Medications  No current outpatient prescriptions on file.  BP 136/51  Pulse 84  Temp 97.9 F (36.6 C) (Oral)  Resp 16  Ht 6\' 4"  (1.93 m)  Wt 260 lb (117.935 kg)  BMI 31.65 kg/m2  SpO2 99%  Physical Exam  Nursing note and vitals reviewed. Constitutional: He is oriented to person, place, and time. He appears well-developed and well-nourished.  HENT:  Head: Normocephalic and atraumatic.  Eyes: Pupils are equal, round, and reactive to light.  Neck: Normal range of motion. Neck supple.  Pulmonary/Chest: Effort normal and breath sounds normal.  Musculoskeletal:       Patient with tenderness laterally over right hip. There is no erythema or warmth. He has full active range of motion of the joint. In  Neurological: He is alert and oriented  to person, place, and time.  Skin: Skin is warm and dry.  Psychiatric: He has a normal mood and affect.    ED Course  Procedures (including critical care time)  Labs Reviewed - No data to display Dg Hip Complete Right  05/04/2012  *RADIOLOGY REPORT*  Clinical Data: Right hip pain and stiffness.  RIGHT HIP - COMPLETE 2+ VIEW  Comparison: Plain films right hip 05/12/2011.  Findings: Imaged bones, joints and soft tissues appear normal.  IMPRESSION: Negative study.  Original Report Authenticated By: Bernadene Bell. Maricela Curet, M.D.     No diagnosis found.    MDM    Plan patient placed on prednisone. He is referred to Dr. Pearletha Forge. X-Brynn Mulgrew does not show any evidence of acute injury. I have refused reviewed the narcotic database and the patient has received over 130 narcotic pills since May 5 of this year. Patient states that he does receive some pain medicine for his back. Patient advised that this is a large amount of narcotics and we placed on prednisone for his hip this time.     Hilario Quarry, MD 05/04/12 1125

## 2012-05-04 NOTE — ED Notes (Signed)
MD at bedside. 

## 2012-05-04 NOTE — Discharge Instructions (Signed)
Bursitis  Bursitis is a swelling and soreness (inflammation) of a fluid-filled sac (bursa) that overlies and protects a joint. It can be caused by injury, overuse of the joint, arthritis or infection. The joints most likely to be affected are the elbows, shoulders, hips and knees.  HOME CARE INSTRUCTIONS    Apply ice to the affected area for 15 to 20 minutes each hour while awake for 2 days. Put the ice in a plastic bag and place a towel between the bag of ice and your skin.   Rest the injured joint as much as possible, but continue to put the joint through a full range of motion, 4 times per day. (The shoulder joint especially becomes rapidly "frozen" if not used.) When the pain lessens, begin normal slow movements and usual activities.   Only take over-the-counter or prescription medicines for pain, discomfort or fever as directed by your caregiver.   Your caregiver may recommend draining the bursa and injecting medicine into the bursa. This may help the healing process.   Follow all instructions for follow-up with your caregiver. This includes any orthopedic referrals, physical therapy and rehabilitation. Any delay in obtaining necessary care could result in a delay or failure of the bursitis to heal and chronic pain.  SEEK IMMEDIATE MEDICAL CARE IF:    Your pain increases even during treatment.   You develop an oral temperature above 102 F (38.9 C) and have heat and inflammation over the involved bursa.  MAKE SURE YOU:    Understand these instructions.   Will watch your condition.   Will get help right away if you are not doing well or get worse.  Document Released: 11/05/2000 Document Revised: 10/28/2011 Document Reviewed: 10/10/2009  [employer] Patient Information 2012 [employer]

## 2012-05-04 NOTE — ED Notes (Signed)
Pt c/o right hip pain since 4am. Pt reports h/o bursitis in same hip with steroid injection from ortho approx 1 year ago. Pt sts he took 800mg  Ibuprofen. Pt drove self to ED.

## 2013-01-24 ENCOUNTER — Ambulatory Visit (INDEPENDENT_AMBULATORY_CARE_PROVIDER_SITE_OTHER): Payer: 59 | Admitting: Family Medicine

## 2013-01-24 ENCOUNTER — Encounter: Payer: Self-pay | Admitting: Family Medicine

## 2013-01-24 VITALS — BP 109/72 | HR 84 | Ht 76.0 in | Wt 292.0 lb

## 2013-01-24 DIAGNOSIS — L309 Dermatitis, unspecified: Secondary | ICD-10-CM | POA: Insufficient documentation

## 2013-01-24 DIAGNOSIS — G4726 Circadian rhythm sleep disorder, shift work type: Secondary | ICD-10-CM

## 2013-01-24 DIAGNOSIS — L259 Unspecified contact dermatitis, unspecified cause: Secondary | ICD-10-CM

## 2013-01-24 MED ORDER — TRIAMCINOLONE ACETONIDE 0.1 % EX CREA: TOPICAL_CREAM | Freq: Two times a day (BID) | CUTANEOUS | Status: DC

## 2013-01-24 MED ORDER — MODAFINIL 200 MG PO TABS: 200.0000 mg | ORAL_TABLET | Freq: Every day | ORAL | Status: DC

## 2013-01-24 NOTE — Progress Notes (Signed)
CC: Bryan Harvey is a 47 y.o. male is here for Establish Care and Fatigue   Subjective: HPI:  Patient presents to reestablish care  Patient complains of fatigue in the evening during his 6 PM-6 AM shift that occurs 7 days a week every other week. He reports feeling drowsy and wanting to sleep 6 hours into his shift. He has tried caffeine without much improvement. He gets 7 hours of sleep once off of his shift. Regardless of a week on or week off he sleeps the same time every day. He does snore and reports occasional nonrestorative sleeping. He is unsure about stopping breathing during sleep. He reports no trouble with sleepiness prior to taking this new job opportunity/shift. Symptoms are described as moderate in severity and worse the longer he is at work. Nothing else makes better or worse other than above. They seem to be getting no better or worse since he started his job in September  Patient complains of rash on back and proximal arms. It has been present there for a few months. He always gets it during the winter and improves greatly during the spring and summer to where it is absent. It improves with moisturizing creams. It worsens and cold and dry environments. It is mildly itchy. It does not change throughout the day.  Review of Systems - General ROS: negative for - chills, fever, night sweats, weight gain or weight loss Ophthalmic ROS: negative for - decreased vision Psychological ROS: negative for - anxiety or depression ENT ROS: negative for - hearing change, nasal congestion, tinnitus or allergies Hematological and Lymphatic ROS: negative for - bleeding problems, bruising or swollen lymph nodes Breast ROS: negative Respiratory ROS: no cough, shortness of breath, or wheezing Cardiovascular ROS: no chest pain or dyspnea on exertion Gastrointestinal ROS: no abdominal pain, change in bowel habits, or black or bloody stools Genito-Urinary ROS: negative for - genital discharge,  genital ulcers, incontinence or abnormal bleeding from genitals Musculoskeletal ROS: negative for - joint pain or muscle pain Neurological ROS: negative for - headaches or memory loss Dermatological ROS: negative for lumps, mole changes, rash and skin lesion changes other than that described below history of present illness  Past Medical History  Diagnosis Date  . Back pain   . GERD (gastroesophageal reflux disease)   . Hyperlipidemia   . HYPERLIPIDEMIA 01/19/2008    Qualifier: Diagnosis of  By: Thomos Lemons    . GASTROESOPHAGEAL REFLUX, NO ESOPHAGITIS 08/30/2006    Qualifier: Diagnosis of  By: Thomos Lemons    . 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 DISC DISEASE, LUMBAR 12/28/2007    Qualifier: Diagnosis of  By: Thomos Lemons    . BACK PAIN WITH RADICULOPATHY 08/19/2008    Qualifier: Diagnosis of  By: Thomos Lemons       Family History  Problem Relation Age of Onset  . Lymphoma Father   . Diabetes Mother      History  Substance Use Topics  . Smoking status: Never Smoker   . Smokeless tobacco: Not on file  . Alcohol Use: Yes     Objective: Filed Vitals:   01/24/13 1100  BP: 109/72  Pulse: 84    General: Alert and Oriented, No Acute Distress HEENT: Pupils equal, round, reactive to light. Conjunctivae clear.  Moist mucous membranes, pharynx without inflammation nor lesions.  Neck supple without palpable lymphadenopathy nor abnormal masses. Lungs:  Clear to auscultation bilaterally, no wheezing/ronchi/rales.  Comfortable work of breathing. Good air movement. Cardiac: Regular rate and rhythm. Normal S1/S2.  No murmurs, rubs, nor gallops.   Abdomen: Obese soft nontender Extremities: No peripheral edema.  Strong peripheral pulses.  Mental Status: No depression, anxiety, nor agitation. Skin: Eczematous rash of mild severity on the proximal arms and between the shoulder blades  Assessment & Plan: Bryan Harvey was  seen today for establish care and fatigue.  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 Take 1 hour before work shift.  Eczema - triamcinolone cream (KENALOG) 0.1 %; Apply topically 2 (two) times daily.  Shift work sleep disorder: Discussed diagnosis with patient, as he is unable to change his current work situation due to financial reasons we will try Provigil as above. If not improving his symptoms he is open to the idea of a sleep study in the near future. I've asked him to return in 3-4 weeks. Eczema: He is in agreement with me that he is most likely had this for many years and that is uncontrolled at this time, will start triamcinolone cream avoiding the face to be used for flares, moisturizing cream for maintenance.     Return in about 4 weeks (around 02/21/2013).

## 2013-02-21 ENCOUNTER — Encounter: Payer: Self-pay | Admitting: Family Medicine

## 2013-02-21 ENCOUNTER — Ambulatory Visit (INDEPENDENT_AMBULATORY_CARE_PROVIDER_SITE_OTHER): Payer: 59 | Admitting: Family Medicine

## 2013-02-21 VITALS — BP 111/77 | HR 80 | Wt 285.0 lb

## 2013-02-21 DIAGNOSIS — G4726 Circadian rhythm sleep disorder, shift work type: Secondary | ICD-10-CM

## 2013-02-21 MED ORDER — ARMODAFINIL 150 MG PO TABS: ORAL_TABLET | ORAL | Status: DC

## 2013-02-21 NOTE — Progress Notes (Signed)
CC: Bryan Harvey is a 47 y.o. male is here for f/u shift work sleep disorder   Subjective: HPI:  Followup shift work sleep disorder. Since starting Provigil patient reports significant improvement in sleepiness during nighttime shift work. He reports complete resolution of fatigue and sleepiness occurring halfway through shift. He is now able to complete a full shift without any tiredness whatsoever. He denies any change in productivity to the worse. He denies paranoia, mental disturbance, anxiety, depression, sleep disturbance, nor unintentional weight loss. He feels well rested after sleep. He attributes his weight loss to recently starting a cardiovascular exercise program. He denies chest pain, irregular heartbeat, shortness of breath, nausea, abdominal pain.  Even after insurance coverage 1 month supply costs $150   Review Of Systems Outlined In HPI  Past Medical History  Diagnosis Date  . Back pain   . GERD (gastroesophageal reflux disease)   . Hyperlipidemia   . HYPERLIPIDEMIA 01/19/2008    Qualifier: Diagnosis of  By: Thomos Lemons    . GASTROESOPHAGEAL REFLUX, NO ESOPHAGITIS 08/30/2006    Qualifier: Diagnosis of  By: Thomos Lemons    . 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 DISC DISEASE, LUMBAR 12/28/2007    Qualifier: Diagnosis of  By: Thomos Lemons    . BACK PAIN WITH RADICULOPATHY 08/19/2008    Qualifier: Diagnosis of  By: Thomos Lemons       Family History  Problem Relation Age of Onset  . Lymphoma Father   . Diabetes Mother      History  Substance Use Topics  . Smoking status: Never Smoker   . Smokeless tobacco: Not on file  . Alcohol Use: Yes     Objective: Filed Vitals:   02/21/13 1101  BP: 111/77  Pulse: 80    Vital signs reviewed. General: Alert and Oriented, No Acute Distress HEENT: Pupils equal, round, reactive to light. Conjunctivae clear.  External ears unremarkable.  Moist  mucous membranes. Lungs: Clear and comfortable work of breathing, speaking in full sentences without accessory muscle use. Cardiac: Regular rate and rhythm. No murmurs rubs or gallops Neuro: CN II-XII grossly intact, gait normal. Extremities: No peripheral edema.  Strong peripheral pulses.  Mental Status: No depression, anxiety, nor agitation. Logical though process. Skin: Warm and dry.  Assessment & Plan: Bryan Harvey was seen today for f/u shift work sleep disorder.  Diagnoses and associated orders for this visit:  Shift work sleep disorder - Armodafinil 150 MG tablet; One by mouth one hour prior to work shift.    shift work sleep disorder: Controlled, there are coupons available on [employer] and he tells me his research shows this is on formulary at a better price than Provigil. We'll switch to nuvigil. Return in one month for any side effects or ineffectiveness, otherwise if response is similar to Provigil may return in 3 months.   Return in about 3 months (around 05/23/2013).

## 2013-03-12 ENCOUNTER — Encounter: Payer: Self-pay | Admitting: Physician Assistant

## 2013-03-12 ENCOUNTER — Ambulatory Visit (INDEPENDENT_AMBULATORY_CARE_PROVIDER_SITE_OTHER): Payer: 59 | Admitting: Physician Assistant

## 2013-03-12 VITALS — BP 137/82 | HR 106 | Temp 98.1°F | Wt 283.0 lb

## 2013-03-12 DIAGNOSIS — J3489 Other specified disorders of nose and nasal sinuses: Secondary | ICD-10-CM

## 2013-03-12 DIAGNOSIS — J019 Acute sinusitis, unspecified: Secondary | ICD-10-CM

## 2013-03-12 DIAGNOSIS — R0981 Nasal congestion: Secondary | ICD-10-CM

## 2013-03-12 MED ORDER — AMOXICILLIN-POT CLAVULANATE 875-125 MG PO TABS: 1.0000 | ORAL_TABLET | Freq: Two times a day (BID) | ORAL | Status: DC

## 2013-03-12 MED ORDER — MOMETASONE FUROATE 50 MCG/ACT NA SUSP: 2.0000 | Freq: Every day | NASAL | Status: DC

## 2013-03-12 NOTE — Progress Notes (Signed)
  Subjective:    Patient ID: Bryan Harvey, male    DOB: June 30, 1966, 47 y.o.   MRN: [id number]  Sinusitis This is a new problem. The current episode started in the past 7 days. The problem has been gradually worsening since onset. There has been no fever. His pain is at a severity of 6/10. The pain is moderate. Associated symptoms include congestion, coughing, headaches, sinus pressure and a sore throat. Pertinent negatives include no chills, diaphoresis, ear pain, hoarse voice, neck pain, shortness of breath, sneezing or swollen glands. Treatments tried: Mucinex D and nyquil and allegra. The treatment provided mild relief.       Review of Systems  Constitutional: Negative for chills and diaphoresis.  HENT: Positive for congestion, sore throat and sinus pressure. Negative for ear pain, hoarse voice, sneezing and neck pain.   Respiratory: Positive for cough. Negative for shortness of breath.   Neurological: Positive for headaches.       Objective:   Physical Exam  Constitutional: He is oriented to person, place, and time. He appears well-developed and well-nourished.  HENT:  Head: Normocephalic and atraumatic.  Mouth/Throat: Oropharynx is clear and moist.  Right and Left TM with air bubbles and fluid behind ears. No blood or pus.   Turbinates red and swollen.   Tenderness to palpation over frontal sinuses.   Eyes:  Bilateral injected conjunctiva. No discharge.  Neck: Normal range of motion. Neck supple.  Cardiovascular: Normal rate, regular rhythm and normal heart sounds.   Pulmonary/Chest: Effort normal and breath sounds normal. He has no wheezes.  Lymphadenopathy:    He has no cervical adenopathy.  Neurological: He is alert and oriented to person, place, and time.  Skin: Skin is warm and dry.  Psychiatric: He has a normal mood and affect. His behavior is normal.          Assessment & Plan:  Sinusitis/nasal congestion- continue on zyrtec seems there is an allergic  component. Consider sudafed for the next 3-4 days. Start on nasonex. Gave augmentin to start. Discussed sinus rinses.

## 2013-03-12 NOTE — Patient Instructions (Addendum)
Mucinex D twice a day.  Zyrtec/Claritin  Start Augmentin along with nasal spray.

## 2013-03-21 ENCOUNTER — Encounter: Payer: Self-pay | Admitting: Family Medicine

## 2013-03-21 ENCOUNTER — Ambulatory Visit (INDEPENDENT_AMBULATORY_CARE_PROVIDER_SITE_OTHER): Payer: 59 | Admitting: Family Medicine

## 2013-03-21 VITALS — BP 115/73 | HR 84 | Wt 286.0 lb

## 2013-03-21 DIAGNOSIS — H1089 Other conjunctivitis: Secondary | ICD-10-CM

## 2013-03-21 DIAGNOSIS — A499 Bacterial infection, unspecified: Secondary | ICD-10-CM

## 2013-03-21 DIAGNOSIS — H109 Unspecified conjunctivitis: Secondary | ICD-10-CM

## 2013-03-21 MED ORDER — CIPROFLOXACIN HCL 0.3 % OP SOLN: OPHTHALMIC | Status: DC

## 2013-03-21 NOTE — Progress Notes (Signed)
CC: Bryan Harvey is a 47 y.o. male is here for possible conjunctivitis   Subjective: HPI:  Patient complains of redness of the eyes that occurred on Monday. Has not gotten better or worse since onset. Describes redness as moderate in severity. Does not fluctuate in a day nothing makes better or worse. Associated with a itchy sensation of the that improves with over-the-counter lubricant eyedrops for an hour after application. Denies vision loss or ocular pain otherwise. Denies photophobia nor discharge of the eye, denies pain with blinking or movement of the eyes. Denies recent change in contacts. Reports nasal congestion sinus pressure significantly improved since starting Augmentin. Denies fever, chills, swollen lymph nodes, cough, sore throat, facial pain, headaches, dizziness, motor or sensory disturbances.   Review Of Systems Outlined In HPI  Past Medical History  Diagnosis Date  . Back pain   . GERD (gastroesophageal reflux disease)   . Hyperlipidemia   . HYPERLIPIDEMIA 01/19/2008    Qualifier: Diagnosis of  By: Thomos Lemons    . GASTROESOPHAGEAL REFLUX, NO ESOPHAGITIS 08/30/2006    Qualifier: Diagnosis of  By: Thomos Lemons    . 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 DISC DISEASE, LUMBAR 12/28/2007    Qualifier: Diagnosis of  By: Thomos Lemons    . BACK PAIN WITH RADICULOPATHY 08/19/2008    Qualifier: Diagnosis of  By: Thomos Lemons       Family History  Problem Relation Age of Onset  . Lymphoma Father   . Diabetes Mother      History  Substance Use Topics  . Smoking status: Never Smoker   . Smokeless tobacco: Not on file  . Alcohol Use: Yes     Objective: Filed Vitals:   03/21/13 1552  BP: 115/73  Pulse: 84    General: Alert and Oriented, No Acute Distress HEENT: Pupils equal, round, reactive to light. Moderate conjunctival injection closest to the iris.  No photophobia on penlight exam,  anterior chambers without debris bilaterally. External ears unremarkable, canals clear with intact TMs with appropriate landmarks.  Middle ear appears open without effusion. Pink inferior turbinates.  Moist mucous membranes, pharynx without inflammation nor lesions.  Neck supple without palpable lymphadenopathy nor abnormal masses. Lungs: Clear and comfortable work of breathing Cardiac: Regular rate and rhythm.    Extremities: No peripheral edema.  Strong peripheral pulses.  Mental Status: No depression, anxiety, nor agitation. Skin: Warm and dry.  Assessment & Plan: Bryan Harvey was seen today for possible conjunctivitis.  Diagnoses and associated orders for this visit:  Bacterial conjunctivitis - ciprofloxacin (CILOXAN) 0.3 % ophthalmic solution; Administer 1 drop, every 2 hours, while awake, for 2 days. Then 1 drop, every 4 hours, while awake, for the next 5 days.    Snellen chart 20/30 right eye, 20/40 left eye. Discussed with them my concern for bacterial conjunctivitis and given contact use start Cipro for pseudomonal coverage. Discussed signs and symptoms that would require emergent evaluation with ophthalmology.  Avoid contact use for the next 2 days, discussed means of minimizing contagiousness  Return if symptoms worsen or fail to improve.

## 2013-03-30 ENCOUNTER — Ambulatory Visit (INDEPENDENT_AMBULATORY_CARE_PROVIDER_SITE_OTHER): Payer: 59 | Admitting: Family Medicine

## 2013-03-30 ENCOUNTER — Encounter: Payer: Self-pay | Admitting: Family Medicine

## 2013-03-30 VITALS — BP 102/67 | HR 86 | Wt 287.0 lb

## 2013-03-30 DIAGNOSIS — H1089 Other conjunctivitis: Secondary | ICD-10-CM

## 2013-03-30 DIAGNOSIS — A499 Bacterial infection, unspecified: Secondary | ICD-10-CM

## 2013-03-30 DIAGNOSIS — R51 Headache: Secondary | ICD-10-CM

## 2013-03-30 DIAGNOSIS — H109 Unspecified conjunctivitis: Secondary | ICD-10-CM

## 2013-03-30 MED ORDER — POLYMYXIN B-TRIMETHOPRIM 10000-0.1 UNIT/ML-% OP SOLN: 1.0000 [drp] | OPHTHALMIC | Status: DC

## 2013-03-30 MED ORDER — KETOROLAC TROMETHAMINE 60 MG/2ML IM SOLN
60.0000 mg | Freq: Once | INTRAMUSCULAR | Status: AC
  Administered 2013-03-30: 60 mg via INTRAMUSCULAR

## 2013-03-30 NOTE — Progress Notes (Signed)
CC: Bryan Harvey is a 47 y.o. male is here for possible conjunctivitis   Subjective: HPI:  Patient returns for reddening of the eyes. Prior episode from last week resolved completely 2 days after starting Cipro. Unfortunately redness returned 2 days after stopping Cipro. He complains of red eyes and a forehead headache it has not gotten better or worse since onset of about 3 days ago. No interventions other than Advil which has not helped either symptom. He denies discharge, photophobia, pain with movement of eye, sensation of foreign object, fevers, chills, neck pain, vision loss, dizziness nor other motor or sensory disturbances    Review Of Systems Outlined In HPI  Past Medical History  Diagnosis Date  . Back pain   . GERD (gastroesophageal reflux disease)   . Hyperlipidemia   . HYPERLIPIDEMIA 01/19/2008    Qualifier: Diagnosis of  By: Thomos Lemons    . GASTROESOPHAGEAL REFLUX, NO ESOPHAGITIS 08/30/2006    Qualifier: Diagnosis of  By: Thomos Lemons    . 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 DISC DISEASE, LUMBAR 12/28/2007    Qualifier: Diagnosis of  By: Thomos Lemons    . BACK PAIN WITH RADICULOPATHY 08/19/2008    Qualifier: Diagnosis of  By: Thomos Lemons       Family History  Problem Relation Age of Onset  . Lymphoma Father   . Diabetes Mother      History  Substance Use Topics  . Smoking status: Never Smoker   . Smokeless tobacco: Not on file  . Alcohol Use: Yes     Objective: Filed Vitals:   03/30/13 1550  BP: 102/67  Pulse: 86    General: Alert and Oriented, No Acute Distress HEENT: Pupils equal, round, reactive to light. With mild to moderate central erythema slightly improving toward the periphery bilaterally. Anterior chamber appears without debris, extraocular muscles intact without pain.  External ears unremarkable, canals clear with intact TMs with appropriate landmarks.  Middle ear  appears open without effusion. Pink inferior turbinates.  Moist mucous membranes, pharynx without inflammation nor lesions.  Neck supple without palpable lymphadenopathy nor abnormal masses. No nuchal rigidity nor neck pain with neck flexion chin to chest Extremities: No peripheral edema.  Strong peripheral pulses.  Mental Status: No depression, anxiety, nor agitation. Skin: Warm and dry.  Assessment & Plan: Kaige was seen today for possible conjunctivitis.  Diagnoses and associated orders for this visit:  Bacterial conjunctivitis - trimethoprim-polymyxin b (POLYTRIM) ophthalmic solution; Place 1 drop into both eyes every 4 (four) hours.  Other Orders - ketorolac (TORADOL) injection 60 mg; Inject 2 mLs (60 mg total) into the muscle once.    Discussed removing contacts until 24 hours after resolution of eye redness, start Polytrim can use until full bottle is empty. Discussed if lack of improvement with the Next 24 hours or sudden decline he should seek ophthalmology evaluation at an emergency room being the weekend.Signs and symptoms requring emergent/urgent reevaluation were discussed with the patient. Fortunately vision is preserved. Given lack of improvement of headache with oral nonsteroidal anti-inflammatory will received Toradol injection today  Return if symptoms worsen or fail to improve.

## 2013-05-16 ENCOUNTER — Ambulatory Visit: Payer: 59 | Admitting: Family Medicine

## 2013-05-16 DIAGNOSIS — Z0289 Encounter for other administrative examinations: Secondary | ICD-10-CM

## 2013-05-23 ENCOUNTER — Other Ambulatory Visit: Payer: Self-pay | Admitting: Family Medicine

## 2013-05-23 ENCOUNTER — Ambulatory Visit: Payer: 59 | Admitting: Sports Medicine

## 2013-05-23 DIAGNOSIS — Z0289 Encounter for other administrative examinations: Secondary | ICD-10-CM

## 2013-05-23 NOTE — Telephone Encounter (Signed)
Sue Lush placed in inbox ready for fax/pickup

## 2013-05-28 ENCOUNTER — Encounter: Payer: Self-pay | Admitting: Sports Medicine

## 2013-05-28 ENCOUNTER — Ambulatory Visit (INDEPENDENT_AMBULATORY_CARE_PROVIDER_SITE_OTHER): Payer: 59 | Admitting: Sports Medicine

## 2013-05-28 ENCOUNTER — Ambulatory Visit (INDEPENDENT_AMBULATORY_CARE_PROVIDER_SITE_OTHER): Payer: 59

## 2013-05-28 VITALS — BP 128/89 | HR 78 | Wt 287.0 lb

## 2013-05-28 DIAGNOSIS — M5137 Other intervertebral disc degeneration, lumbosacral region: Secondary | ICD-10-CM

## 2013-05-28 DIAGNOSIS — M51379 Other intervertebral disc degeneration, lumbosacral region without mention of lumbar back pain or lower extremity pain: Secondary | ICD-10-CM

## 2013-05-28 DIAGNOSIS — W1809XA Striking against other object with subsequent fall, initial encounter: Secondary | ICD-10-CM

## 2013-05-28 DIAGNOSIS — IMO0001 Reserved for inherently not codable concepts without codable children: Secondary | ICD-10-CM

## 2013-05-28 DIAGNOSIS — Y93E1 Activity, personal bathing and showering: Secondary | ICD-10-CM

## 2013-05-28 DIAGNOSIS — M25559 Pain in unspecified hip: Secondary | ICD-10-CM

## 2013-05-28 MED ORDER — OXYCODONE-ACETAMINOPHEN 10-325 MG PO TABS: 1.0000 | ORAL_TABLET | Freq: Three times a day (TID) | ORAL | Status: DC | PRN

## 2013-05-28 MED ORDER — PREDNISONE 50 MG PO TABS: ORAL_TABLET | ORAL | Status: DC

## 2013-05-28 MED ORDER — CYCLOBENZAPRINE HCL 10 MG PO TABS: ORAL_TABLET | ORAL | Status: DC

## 2013-05-28 MED ORDER — KETOROLAC TROMETHAMINE 30 MG/ML IJ SOLN
30.0000 mg | Freq: Once | INTRAMUSCULAR | Status: AC
  Administered 2013-05-28: 30 mg via INTRAMUSCULAR

## 2013-05-28 NOTE — Progress Notes (Signed)
  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 Pain is worse with essentially any movement, severe, persistent. He denies any bowel or bladder dysfunction, or saddle anesthesia. No constitutional symptoms.   Past medical history, Surgical history, Family history not pertinant except as noted below, Social history, Allergies, and medications have been entered into the medical record, reviewed, and no changes needed.   Review of Systems: No fevers, chills, night sweats, weight loss, chest pain, or shortness of breath.   Objective:    General: Well Developed, well nourished,  sits in a forward leaning position and appears in significant pain.   Neuro: Alert and oriented x3, extra-ocular muscles intact, sensation grossly intact.  HEENT: Normocephalic, atraumatic, pupils equal round reactive to light, neck supple, no masses, no lymphadenopathy, thyroid nonpalpable.  Skin: Warm and dry, no rashes. Cardiac: Regular rate and rhythm, no murmurs rubs or gallops, no lower extremity edema.  Respiratory: Clear to auscultation bilaterally. Not using accessory muscles, speaking in full sentences. Back Exam:  Inspection: Unremarkable  Motion: Flexion 45 deg, Extension 45 deg, Side Bending to 45 deg bilaterally,  Rotation to 45 deg bilaterally  SLR laying: Negative  XSLR laying: Negative  Palpable tenderness: left quadratus lumborum and posterior superior iliac spine. FABER: negative. Sensory change: Gross sensation intact to all lumbar and sacral dermatomes.  Reflexes: 2+ at both patellar tendons, 2+ at achilles tendons, Babinski's downgoing.  Strength at foot  Plantar-flexion: 5/5 Dorsi-flexion: 5/5 Eversion: 5/5 Inversion: 5/5    Leg strength  Quad: 5/5 Hamstring: 5/5 Hip flexor: 5/5 Hip abductors: 5/5  Gait unremarkable.  Procedure:  Injection of left paraspinal trigger point  Consent obtained and verified. Time-out conducted. Noted no overlying erythema, induration, or other signs of local infection. Skin prepped in a sterile fashion. Topical analgesic spray: Ethyl chloride. Completed without difficulty. Meds:0.1 cc Kenalog 40, 2 cc lidocaine injected in a fanlike pattern into a palpably spasming muscle.  Pain immediately improved suggesting accurate placement of the medication. Advised to call if fevers/chills, erythema, induration, drainage, or persistent bleeding.  X-rays show prior postoperative changes at the L5-S1 level, nothing new or acute.  Impression and Recommendations:

## 2013-05-28 NOTE — Assessment & Plan Note (Signed)
With a recent fall in the bathtub, twist, and now it seems like a reherniation/muscle strain. Toradol 30 mg intramuscular, oxycodone, trigger point injection given today, Flexeril, prednisone, x-rays. Return in 2 weeks to see how things are going. Home exercises.

## 2013-06-04 ENCOUNTER — Ambulatory Visit: Payer: 59 | Admitting: Sports Medicine

## 2013-06-14 ENCOUNTER — Telehealth: Payer: Self-pay | Admitting: *Deleted

## 2013-06-14 DIAGNOSIS — G4726 Circadian rhythm sleep disorder, shift work type: Secondary | ICD-10-CM

## 2013-06-14 MED ORDER — ARMODAFINIL 50 MG PO TABS: 50.0000 mg | ORAL_TABLET | Freq: Every day | ORAL | Status: DC

## 2013-06-14 NOTE — Telephone Encounter (Signed)
Left message on vm

## 2013-06-14 NOTE — Telephone Encounter (Signed)
Pt calls & states that the nuvigil you prescribed him isn't working as well as the provigil but the provigil isn't covered under his ins anymore.  He states that he is yawning through his whole shift at work.  He wants to know if there is a higher dose.  Please advise.

## 2013-06-14 NOTE — Telephone Encounter (Signed)
Sue Lush, Will you please encourage Bryan Harvey to add a 50mg  dose to his already 150mg  dose, we'll see how that works.  Rx placed in in-box ready for pickup/faxing.

## 2013-06-19 ENCOUNTER — Ambulatory Visit: Payer: 59 | Admitting: Sports Medicine

## 2013-06-20 ENCOUNTER — Encounter: Payer: Self-pay | Admitting: Sports Medicine

## 2013-06-20 ENCOUNTER — Ambulatory Visit (INDEPENDENT_AMBULATORY_CARE_PROVIDER_SITE_OTHER): Payer: 59 | Admitting: Sports Medicine

## 2013-06-20 ENCOUNTER — Telehealth: Payer: Self-pay

## 2013-06-20 VITALS — BP 117/82 | HR 73 | Wt 283.0 lb

## 2013-06-20 DIAGNOSIS — G4726 Circadian rhythm sleep disorder, shift work type: Secondary | ICD-10-CM

## 2013-06-20 DIAGNOSIS — M51369 Other intervertebral disc degeneration, lumbar region without mention of lumbar back pain or lower extremity pain: Secondary | ICD-10-CM

## 2013-06-20 DIAGNOSIS — M5137 Other intervertebral disc degeneration, lumbosacral region: Secondary | ICD-10-CM

## 2013-06-20 DIAGNOSIS — M51379 Other intervertebral disc degeneration, lumbosacral region without mention of lumbar back pain or lower extremity pain: Secondary | ICD-10-CM

## 2013-06-20 DIAGNOSIS — M5136 Other intervertebral disc degeneration, lumbar region: Secondary | ICD-10-CM

## 2013-06-20 MED ORDER — HYDROCODONE-ACETAMINOPHEN 10-325 MG PO TABS: 1.0000 | ORAL_TABLET | Freq: Three times a day (TID) | ORAL | Status: DC | PRN

## 2013-06-20 MED ORDER — ARMODAFINIL 50 MG PO TABS: 50.0000 mg | ORAL_TABLET | Freq: Every day | ORAL | Status: DC

## 2013-06-20 NOTE — Assessment & Plan Note (Signed)
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 Percocet was too strong, we're dropping down to hydrocodone, and he does need an epidural, his previous ones gave him several months of relief. Unfortunately his MRI is over 46 years old, going to obtain a new lumbar spine MRI with IV contrast. This will be in anticipation of right-sided L4-L5 transforaminal epidural injection.

## 2013-06-20 NOTE — Telephone Encounter (Signed)
Called [facility] patient plan does not require notification or PA for MR Lumbar Spine W Wo Contrast. Bjorn Loser Cunningham,CMA

## 2013-06-20 NOTE — Progress Notes (Signed)
  Subjective:    CC: Followup  HPI: Bryan Harvey comes back, he's a pleasant 47 year old male with a history of an L5-S1 fusion. Unfortunately he had an injury that likely caused recurrent symptoms, but in a different nerve distribution. I did trigger point injection, given prednisone, Percocet, muscle relaxers at the last visit, he had temporary benefit from the trigger point injection but his pain came back, he describes it as in the back radiating around the lateral aspect of both hips, right worse than left, over to the anterolateral aspect of the upper right thigh. He feels better when leaning forward, symptoms are worse with Valsalva. Moderate, persistent.  Past medical history, Surgical history, Family history not pertinant except as noted below, Social history, Allergies, and medications have been entered into the medical record, reviewed, and no changes needed.   Review of Systems: No fevers, chills, night sweats, weight loss, chest pain, or shortness of breath.   Objective:    General: Well Developed, well nourished, and in no acute distress.  Neuro: Alert and oriented x3, extra-ocular muscles intact, sensation grossly intact.  HEENT: Normocephalic, atraumatic, pupils equal round reactive to light, neck supple, no masses, no lymphadenopathy, thyroid nonpalpable.  Skin: Warm and dry, no rashes. Cardiac: Regular rate and rhythm, no murmurs rubs or gallops, no lower extremity edema.  Respiratory: Clear to auscultation bilaterally. Not using accessory muscles, speaking in full sentences.  I reviewed his old MRI from 2009, there appears to be a satisfactory fusion at the L5-S1 level, there is a large disc protrusion at the L4-L5 level causing moderate central canal stenosis and right worse than left foraminal stenosis. There is a small disc protrusion at the L3-L4 level. Facet joints look okay.  Impression and Recommendations:

## 2013-06-26 ENCOUNTER — Ambulatory Visit (HOSPITAL_BASED_OUTPATIENT_CLINIC_OR_DEPARTMENT_OTHER)
Admission: RE | Admit: 2013-06-26 | Discharge: 2013-06-26 | Disposition: A | Discharge status: Home / Self Care | Payer: 59 | Source: Ambulatory Visit | Attending: Sports Medicine | Admitting: Sports Medicine

## 2013-06-26 ENCOUNTER — Ambulatory Visit (HOSPITAL_BASED_OUTPATIENT_CLINIC_OR_DEPARTMENT_OTHER): Payer: 59

## 2013-06-26 DIAGNOSIS — M25559 Pain in unspecified hip: Secondary | ICD-10-CM | POA: Insufficient documentation

## 2013-06-26 DIAGNOSIS — R209 Unspecified disturbances of skin sensation: Secondary | ICD-10-CM | POA: Insufficient documentation

## 2013-06-26 DIAGNOSIS — R29898 Other symptoms and signs involving the musculoskeletal system: Secondary | ICD-10-CM | POA: Insufficient documentation

## 2013-06-26 DIAGNOSIS — IMO0002 Reserved for concepts with insufficient information to code with codable children: Secondary | ICD-10-CM | POA: Insufficient documentation

## 2013-06-26 DIAGNOSIS — M48061 Spinal stenosis, lumbar region without neurogenic claudication: Secondary | ICD-10-CM | POA: Insufficient documentation

## 2013-06-26 DIAGNOSIS — M5126 Other intervertebral disc displacement, lumbar region: Secondary | ICD-10-CM | POA: Insufficient documentation

## 2013-06-26 DIAGNOSIS — M79609 Pain in unspecified limb: Secondary | ICD-10-CM | POA: Insufficient documentation

## 2013-06-26 DIAGNOSIS — X500XXA Overexertion from strenuous movement or load, initial encounter: Secondary | ICD-10-CM | POA: Insufficient documentation

## 2013-06-26 MED ORDER — GADOBENATE DIMEGLUMINE 529 MG/ML IV SOLN: 20.0000 mL | Freq: Once | INTRAVENOUS | Status: AC | PRN

## 2013-06-29 ENCOUNTER — Telehealth: Payer: Self-pay

## 2013-06-29 MED ORDER — OXYCODONE-ACETAMINOPHEN 5-325 MG PO TABS: 1.0000 | ORAL_TABLET | Freq: Three times a day (TID) | ORAL | Status: DC | PRN

## 2013-06-29 NOTE — Telephone Encounter (Signed)
I am going to switch this to Percocet 5/325. He needs to keep his MRI followup.

## 2013-06-29 NOTE — Telephone Encounter (Signed)
Pt called stated that hydrocodone is not working for his pain he wants to know if you can send something else to his pharmacy.

## 2013-07-02 NOTE — Telephone Encounter (Signed)
Pt informed his Rx for Percocet was here at office for pick up and he was reminded to keep his appt for MRI. Marland Kitchen

## 2013-07-04 ENCOUNTER — Ambulatory Visit (INDEPENDENT_AMBULATORY_CARE_PROVIDER_SITE_OTHER): Payer: 59 | Admitting: Sports Medicine

## 2013-07-04 ENCOUNTER — Encounter: Payer: Self-pay | Admitting: Sports Medicine

## 2013-07-04 VITALS — BP 124/80 | HR 87 | Wt 284.0 lb

## 2013-07-04 DIAGNOSIS — M51369 Other intervertebral disc degeneration, lumbar region without mention of lumbar back pain or lower extremity pain: Secondary | ICD-10-CM

## 2013-07-04 DIAGNOSIS — M5136 Other intervertebral disc degeneration, lumbar region: Secondary | ICD-10-CM

## 2013-07-04 DIAGNOSIS — M5137 Other intervertebral disc degeneration, lumbosacral region: Secondary | ICD-10-CM

## 2013-07-04 DIAGNOSIS — M51379 Other intervertebral disc degeneration, lumbosacral region without mention of lumbar back pain or lower extremity pain: Secondary | ICD-10-CM

## 2013-07-04 NOTE — Progress Notes (Signed)
  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 He continued to have pain that he localizes the low back radiating down the left leg in an L5 versus an L4 distribution. Pain is worse with flexion, worse with Valsalva, he has not responded to steroids, home exercises, NSAIDs, he has had epidurals in the past that were tremendously effective. Pain is moderate, persistent.  Past medical history, Surgical history, Family history not pertinant except as noted below, Social history, Allergies, and medications have been entered into the medical record, reviewed, and no changes needed.   Review of Systems: No fevers, chills, night sweats, weight loss, chest pain, or shortness of breath.   Objective:    General: Well Developed, well nourished, and in no acute distress.  Neuro: Alert and oriented x3, extra-ocular muscles intact, sensation grossly intact.  HEENT: Normocephalic, atraumatic, pupils equal round reactive to light, neck supple, no masses, no lymphadenopathy, thyroid nonpalpable.  Skin: Warm and dry, no rashes. Cardiac: Regular rate and rhythm, no murmurs rubs or gallops, no lower extremity edema.  Respiratory: Clear to auscultation bilaterally. Not using accessory muscles, speaking in full sentences.  MRI was reviewed, there is a small disc protrusion at the L3-L4 level, a large broad-based disc protrusion that predominantly affects the right neural foramen at the L4-L5 level, there is also some extension into the left L4-L5 neural foramen. He is status post fusion with cage placement at the L5-S1 level.  Impression and Recommendations:

## 2013-07-04 NOTE — Assessment & Plan Note (Addendum)
Repeat MRI confirms L4-L5 degenerative disc disease, there has been an interval laminotomy at the right side, symptoms are predominantly on the left side, and referrable to the L4 versus the L5 nerve root. Continue narcotics, at this point I do think we need to proceed with a left-sided L4-L5 epidural steroid injection. Based on my personal interpretation of the MRI I do not see any evidence of arachnoiditis. Return to see me one to 2 weeks after the injection to evaluate response.

## 2013-07-11 ENCOUNTER — Ambulatory Visit
Admission: RE | Admit: 2013-07-11 | Discharge: 2013-07-11 | Disposition: A | Discharge status: Home / Self Care | Payer: 59 | Source: Ambulatory Visit | Attending: Sports Medicine | Admitting: Sports Medicine

## 2013-07-11 VITALS — BP 148/78 | HR 82

## 2013-07-11 DIAGNOSIS — E669 Obesity, unspecified: Secondary | ICD-10-CM

## 2013-07-11 DIAGNOSIS — K449 Diaphragmatic hernia without obstruction or gangrene: Secondary | ICD-10-CM

## 2013-07-11 DIAGNOSIS — M5136 Other intervertebral disc degeneration, lumbar region: Secondary | ICD-10-CM

## 2013-07-11 DIAGNOSIS — L309 Dermatitis, unspecified: Secondary | ICD-10-CM

## 2013-07-11 DIAGNOSIS — K219 Gastro-esophageal reflux disease without esophagitis: Secondary | ICD-10-CM

## 2013-07-11 DIAGNOSIS — G4726 Circadian rhythm sleep disorder, shift work type: Secondary | ICD-10-CM

## 2013-07-11 DIAGNOSIS — E785 Hyperlipidemia, unspecified: Secondary | ICD-10-CM

## 2013-07-11 MED ORDER — METHYLPREDNISOLONE ACETATE 40 MG/ML INJ SUSP (RADIOLOG
120.0000 mg | Freq: Once | INTRAMUSCULAR | Status: AC
  Administered 2013-07-11: 120 mg via EPIDURAL

## 2013-07-11 MED ORDER — IOHEXOL 180 MG/ML  SOLN
1.0000 mL | Freq: Once | INTRAMUSCULAR | Status: AC | PRN
  Administered 2013-07-11: 1 mL via EPIDURAL

## 2013-07-12 ENCOUNTER — Telehealth: Payer: Self-pay | Admitting: *Deleted

## 2013-07-12 MED ORDER — OXYCODONE-ACETAMINOPHEN 5-325 MG PO TABS: 1.0000 | ORAL_TABLET | Freq: Three times a day (TID) | ORAL | Status: DC | PRN

## 2013-07-12 NOTE — Telephone Encounter (Signed)
Refill provided until he can go over results/response with Dr. Karie Schwalbe

## 2013-07-12 NOTE — Telephone Encounter (Signed)
Left message on vm and rx up front for pt pick up

## 2013-07-12 NOTE — Telephone Encounter (Signed)
Pt calls and states Dr. Karie Schwalbe told him to call when needed a refill on pain med- he has one pill left and will need a refill.  Had spinal injection yesterday. Call when ready for pick up

## 2013-07-22 ENCOUNTER — Emergency Department (INDEPENDENT_AMBULATORY_CARE_PROVIDER_SITE_OTHER)
Admission: EM | Admit: 2013-07-22 | Discharge: 2013-07-22 | Disposition: A | Discharge status: Home / Self Care | Payer: 59 | Source: Home / Self Care | Attending: Family Medicine | Admitting: Family Medicine

## 2013-07-22 DIAGNOSIS — IMO0002 Reserved for concepts with insufficient information to code with codable children: Secondary | ICD-10-CM

## 2013-07-22 DIAGNOSIS — M5416 Radiculopathy, lumbar region: Secondary | ICD-10-CM

## 2013-07-22 MED ORDER — METHYLPREDNISOLONE SODIUM SUCC 125 MG IJ SOLR
125.0000 mg | Freq: Once | INTRAMUSCULAR | Status: AC
  Administered 2013-07-22: 125 mg via INTRAMUSCULAR

## 2013-07-22 MED ORDER — PREDNISONE 20 MG PO TABS: ORAL_TABLET | ORAL | Status: DC

## 2013-07-22 MED ORDER — HYDROCODONE-ACETAMINOPHEN 5-325 MG PO TABS: 1.0000 | ORAL_TABLET | Freq: Four times a day (QID) | ORAL | Status: DC | PRN

## 2013-07-22 NOTE — ED Provider Notes (Signed)
CSN: [id number]     Arrival date & time 07/22/13  1552 History   First MD Initiated Contact with Patient 07/22/13 1623     Chief Complaint  Patient presents with  . Back Pain    chronic back pain     HPI Comments: Bryan Harvey has a history of L4 radiculitis, status post L5-S1 discectomy with fusion.  He had a left side L4-L5 interlaminar epidural injection on 07/04/13 that provided complete relief for about 10 days. He reports that he was walking more than usual yesterday while at a ball game, and today awoke with recurrent radicular pain in his right leg.  No bowel or bladder dysfunction.  No saddle numbness.  Patient is a 47 y.o. male presenting with back pain. The history is provided by the patient.  Back Pain Location:  Gluteal region Quality:  Aching and burning Radiates to:  R posterior upper leg Pain severity:  Severe Pain is:  Same all the time Onset quality:  Sudden Duration:  1 day Timing:  Constant Progression:  Unchanged Chronicity:  Recurrent Relieved by:  Nothing Worsened by:  Bending, movement and ambulation Ineffective treatments:  NSAIDs Associated symptoms: leg pain and paresthesias   Associated symptoms: no abdominal pain, no bladder incontinence, no bowel incontinence, no dysuria, no fever, no numbness, no perianal numbness, no tingling and no weakness     Past Medical History  Diagnosis Date  . Back pain   . GERD (gastroesophageal reflux disease)   . Hyperlipidemia   . HYPERLIPIDEMIA 01/19/2008    Qualifier: Diagnosis of  By: Thomos Lemons    . GASTROESOPHAGEAL REFLUX, NO ESOPHAGITIS 08/30/2006    Qualifier: Diagnosis of  By: Thomos Lemons    . 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 DISC DISEASE, LUMBAR 12/28/2007    Qualifier: Diagnosis of  By: Thomos Lemons    . BACK PAIN WITH RADICULOPATHY 08/19/2008    Qualifier: Diagnosis of  By: Thomos Lemons     Past Surgical History  Procedure Laterality  Date  . Back surgery     Family History  Problem Relation Age of Onset  . Lymphoma Father   . Diabetes Mother    History  Substance Use Topics  . Smoking status: Never Smoker   . Smokeless tobacco: Not on file  . Alcohol Use: Yes    Review of Systems  Constitutional: Negative for fever.  Gastrointestinal: Negative for abdominal pain and bowel incontinence.  Genitourinary: Negative for bladder incontinence and dysuria.  Musculoskeletal: Positive for back pain.  Neurological: Positive for paresthesias. Negative for tingling, weakness and numbness.  All other systems reviewed and are negative.    Allergies  Review of patient's allergies indicates no known allergies.  Home Medications   Current Outpatient Rx  Name  Route  Sig  Dispense  Refill  . Armodafinil 50 MG tablet   Oral   Take 1 tablet (50 mg total) by mouth daily. Add to 150mg  daily dose for total dose of 200mg  daily.   30 tablet   0   . cyclobenzaprine (FLEXERIL) 10 MG tablet      One half tab PO qHS, then increase gradually to one tab TID.   30 tablet   0   . Ibuprofen (ADVIL PO)   Oral   Take by mouth.         [name] Bicarbonate (ZEGERID PO)   Oral  Take by mouth.         . oxyCODONE-acetaminophen (PERCOCET/ROXICET) 5-325 MG per tablet   Oral   Take 1 tablet by mouth every 8 (eight) hours as needed for pain.   30 tablet   0   . HYDROcodone-acetaminophen (NORCO/VICODIN) 5-325 MG per tablet   Oral   Take 1 tablet by mouth every 6 (six) hours as needed for pain.   12 tablet   0   . predniSONE (DELTASONE) 20 MG tablet      Take one tab by mouth twice daily for four days, then one tab once daily for three days.  Take with food.   11 tablet   0    BP 116/80  Pulse 93  Temp(Src) 97.7 F (36.5 C) (Oral)  Ht 6\' 4"  (1.93 m)  Wt 280 lb (127.007 kg)  BMI 34.1 kg/m2  SpO2 95% Physical Exam  Nursing note and vitals reviewed. Constitutional: He is oriented to person, place,  and time. He appears well-developed and well-nourished. No distress.  Patient appears uncomfortable, and prefers to stand  HENT:  Head: Atraumatic.  Eyes: Conjunctivae are normal. Pupils are equal, round, and reactive to light.  Neck: Neck supple.  Cardiovascular: Normal heart sounds.   Pulmonary/Chest: Breath sounds normal.  Abdominal: There is no tenderness.  Musculoskeletal:       Lumbar back: He exhibits no tenderness.  Sitting knee extension test on right positive at about 45 degrees.  Straight leg raise on right positive at about 10 degrees above horizontal.  Good patellar and achilles reflexes however  Neurological: He is alert and oriented to person, place, and time.  Skin: Skin is warm and dry.    ED Course  Procedures  none          MDM   1. Lumbar radiculopathy, acute    Solumedrol 125mg  IM.  Begin prednisone taper tomorrow.  Lortab for pain. Followup with Dr. Rodney Langton in 2 to 3 days.    Lattie Haw, MD 07/26/13 [phone]

## 2013-07-22 NOTE — ED Notes (Addendum)
Bryan Harvey is a patient of Dr Benjamin Stain for his back pain. The pain is worse today and he is not sure if it is due to walking up steps yesterday or the epidural steroid injection wore off. He had the injection 2 weeks ago. He had a MRI at the end of July.

## 2013-07-24 ENCOUNTER — Telehealth: Payer: Self-pay

## 2013-07-24 MED ORDER — HYDROCODONE-ACETAMINOPHEN 5-325 MG PO TABS: 1.0000 | ORAL_TABLET | Freq: Four times a day (QID) | ORAL | Status: DC | PRN

## 2013-07-24 NOTE — Telephone Encounter (Signed)
Pain relief lasted 10 days then Saturday went to college game and walking then pain is back like it was. Barry Dienes, LPN

## 2013-07-24 NOTE — Telephone Encounter (Signed)
Patient called stated that he had an epidural done and he is not better he wants to know if he can go ahead and get a rx for pain medicine. Cathlin Buchan,CMA

## 2013-07-24 NOTE — Telephone Encounter (Addendum)
10 days is a success, shots are given as a series of 3 in a six-month period. If he had a ten-day response we should proceed with the second and the third. Prescription is in my box. We can discuss this in detail at his followup visit for the epidural.

## 2013-07-24 NOTE — Telephone Encounter (Signed)
Patient informed advised patient to schedule appt to follow up for epidural. Bryan Harvey

## 2013-07-24 NOTE — Telephone Encounter (Signed)
Was even a temporary improvement in pain?

## 2013-07-25 ENCOUNTER — Ambulatory Visit (INDEPENDENT_AMBULATORY_CARE_PROVIDER_SITE_OTHER): Payer: 59 | Admitting: Sports Medicine

## 2013-07-25 ENCOUNTER — Encounter: Payer: Self-pay | Admitting: Sports Medicine

## 2013-07-25 VITALS — BP 121/82 | HR 72 | Wt 282.0 lb

## 2013-07-25 DIAGNOSIS — M51369 Other intervertebral disc degeneration, lumbar region without mention of lumbar back pain or lower extremity pain: Secondary | ICD-10-CM

## 2013-07-25 DIAGNOSIS — M5136 Other intervertebral disc degeneration, lumbar region: Secondary | ICD-10-CM

## 2013-07-25 DIAGNOSIS — M51379 Other intervertebral disc degeneration, lumbosacral region without mention of lumbar back pain or lower extremity pain: Secondary | ICD-10-CM

## 2013-07-25 DIAGNOSIS — M5137 Other intervertebral disc degeneration, lumbosacral region: Secondary | ICD-10-CM

## 2013-07-25 NOTE — Assessment & Plan Note (Signed)
Initial left-sided L4-L5 interlaminar epidural steroid injection provided near complete relief on the left side for 10 days, they were unable to do the right side. At this point I would like to now try a bilateral L4-L5 transforaminal epidural steroid injection in the hopes of avoiding some of the scar tissue. I would like to hear from him about a week after the shot.

## 2013-07-25 NOTE — Progress Notes (Signed)
  Subjective:    CC: Followup  HPI: Lumbar radiculitis: Daylin has L4 radiculitis, he is status post L5-S1 discectomy with fusion. We recently obtained epidurals, unfortunately he was unable to have the epidural the right side, he did have a left-sided L4-L5 interlaminar epidural which provided approximately 10 days of complete relief. He was happy with the results and is ready to pursue further injections, he did need a refill on his hydrocodone. Symptoms are moderate, persistent.  Past medical history, Surgical history, Family history not pertinant except as noted below, Social history, Allergies, and medications have been entered into the medical record, reviewed, and no changes needed.   Review of Systems: No fevers, chills, night sweats, weight loss, chest pain, or shortness of breath.   Objective:    General: Well Developed, well nourished, and in no acute distress.  Neuro: Alert and oriented x3, extra-ocular muscles intact, sensation grossly intact.  HEENT: Normocephalic, atraumatic, pupils equal round reactive to light, neck supple, no masses, no lymphadenopathy, thyroid nonpalpable.  Skin: Warm and dry, no rashes. Cardiac: Regular rate and rhythm, no murmurs rubs or gallops, no lower extremity edema.  Respiratory: Clear to auscultation bilaterally. Not using accessory muscles, speaking in full sentences.  Impression and Recommendations:

## 2013-07-31 ENCOUNTER — Telehealth: Payer: Self-pay

## 2013-07-31 NOTE — Telephone Encounter (Signed)
Patient called because he had not heard anything from GI, I called and left a message for GI to contact patient. Ilee Randleman,CMA

## 2013-08-01 ENCOUNTER — Telehealth: Payer: Self-pay

## 2013-08-01 NOTE — Telephone Encounter (Signed)
Patient called stated that his back pain is worse he wants to know if he can get a Rx for higher dose Hydrocodone. Also he has an appt on Monday for his Epidural and that Dr. Harl Bowie was out of the office until September 25. Rhonda Cunningham,CMA

## 2013-08-02 MED ORDER — HYDROCODONE-ACETAMINOPHEN 10-325 MG PO TABS: 1.0000 | ORAL_TABLET | Freq: Three times a day (TID) | ORAL | Status: DC | PRN

## 2013-08-02 NOTE — Telephone Encounter (Signed)
Left message on patient vm that Rx Hydrocodone was her at office ready for pickup. Rhonda Cunningham,CMA

## 2013-08-02 NOTE — Addendum Note (Signed)
Addended by: Monica Becton on: 08/02/2013 08:40 AM   Modules accepted: Orders

## 2013-08-02 NOTE — Telephone Encounter (Signed)
Hydrocodone 10 mg in my box.

## 2013-08-06 ENCOUNTER — Ambulatory Visit
Admission: RE | Admit: 2013-08-06 | Discharge: 2013-08-06 | Disposition: A | Discharge status: Home / Self Care | Payer: 59 | Source: Ambulatory Visit | Attending: Sports Medicine | Admitting: Sports Medicine

## 2013-08-06 ENCOUNTER — Other Ambulatory Visit: Payer: Self-pay | Admitting: Sports Medicine

## 2013-08-06 VITALS — BP 161/86 | HR 78

## 2013-08-06 DIAGNOSIS — M5136 Other intervertebral disc degeneration, lumbar region: Secondary | ICD-10-CM

## 2013-08-06 DIAGNOSIS — E669 Obesity, unspecified: Secondary | ICD-10-CM

## 2013-08-06 MED ORDER — IOHEXOL 180 MG/ML  SOLN
1.0000 mL | Freq: Once | INTRAMUSCULAR | Status: AC | PRN
  Administered 2013-08-06: 1 mL via EPIDURAL

## 2013-08-06 MED ORDER — METHYLPREDNISOLONE ACETATE 40 MG/ML INJ SUSP (RADIOLOG
120.0000 mg | Freq: Once | INTRAMUSCULAR | Status: AC
  Administered 2013-08-06: 120 mg via EPIDURAL

## 2013-08-13 ENCOUNTER — Encounter: Payer: Self-pay | Admitting: Sports Medicine

## 2013-08-13 ENCOUNTER — Ambulatory Visit (INDEPENDENT_AMBULATORY_CARE_PROVIDER_SITE_OTHER): Payer: 59 | Admitting: Sports Medicine

## 2013-08-13 ENCOUNTER — Ambulatory Visit (INDEPENDENT_AMBULATORY_CARE_PROVIDER_SITE_OTHER): Payer: 59

## 2013-08-13 VITALS — BP 145/87 | HR 90 | Wt 281.0 lb

## 2013-08-13 DIAGNOSIS — M5136 Other intervertebral disc degeneration, lumbar region: Secondary | ICD-10-CM

## 2013-08-13 DIAGNOSIS — M51379 Other intervertebral disc degeneration, lumbosacral region without mention of lumbar back pain or lower extremity pain: Secondary | ICD-10-CM

## 2013-08-13 DIAGNOSIS — M549 Dorsalgia, unspecified: Secondary | ICD-10-CM

## 2013-08-13 DIAGNOSIS — M51369 Other intervertebral disc degeneration, lumbar region without mention of lumbar back pain or lower extremity pain: Secondary | ICD-10-CM

## 2013-08-13 DIAGNOSIS — IMO0002 Reserved for concepts with insufficient information to code with codable children: Secondary | ICD-10-CM

## 2013-08-13 DIAGNOSIS — M5137 Other intervertebral disc degeneration, lumbosacral region: Secondary | ICD-10-CM

## 2013-08-13 DIAGNOSIS — X500XXA Overexertion from strenuous movement or load, initial encounter: Secondary | ICD-10-CM

## 2013-08-13 MED ORDER — OXYCODONE-ACETAMINOPHEN 10-325 MG PO TABS: 1.0000 | ORAL_TABLET | Freq: Three times a day (TID) | ORAL | Status: DC | PRN

## 2013-08-13 NOTE — Assessment & Plan Note (Signed)
Lifted a heavy object at work today and now has significant pain in his mid thoracic as well as upper lumbar spine. Bilateral transforaminal L4-L5 injections worked extremely well and he was almost pain-free. Increasing pain medicine to Percocet 10/325 for a short period of time, muscle relaxers. He does have a history of a dural patch, current pain feels very similar to her prior episode just before his dural patching for a CSF leak. Because of current symptoms I am going to obtain an x-ray of his thoracic spine, trigger point injection x2 performed today was 50% pain relief. I would like to wait approximately 3 days, if pain does not continue to improve we will obtain an MRI of his lumbar spine with IV contrast looking further at the site of his prior dural patching at the L4-L5 level.

## 2013-08-13 NOTE — Progress Notes (Signed)
  Subjective:    CC: Back pain  HPI: This is a pleasant 47 year old male, he status post 2 level back surgery, with decompression. He subsequently required a dural patch at the L4-L5 level for a CSF leak. His initial L4-L5 epidural worked extremely well, we did do a bilateral L4-L5 transforaminal epidural steroid injection, he was essentially pain-free until today, he tried to lift a 50 pound printer and felt a pop and immediate pain in his low back, the pain radiated around his chest just under the rib cage. He denies any bowel or bladder dysfunction, or saddle numbness, no weakness or pain in his legs. Pain is severe, not palliated with his hydrocodone 10.  Past medical history, Surgical history, Family history not pertinant except as noted below, Social history, Allergies, and medications have been entered into the medical record, reviewed, and no changes needed.   Review of Systems: No fevers, chills, night sweats, weight loss, chest pain, or shortness of breath.   Objective:    General: Well Developed, well nourished, and in no acute distress.  Neuro: Alert and oriented x3, extra-ocular muscles intact, sensation grossly intact.  HEENT: Normocephalic, atraumatic, pupils equal round reactive to light, neck supple, no masses, no lymphadenopathy, thyroid nonpalpable.  Skin: Warm and dry, no rashes. Cardiac: Regular rate and rhythm, no murmurs rubs or gallops, no lower extremity edema.  Respiratory: Clear to auscultation bilaterally. Not using accessory muscles, speaking in full sentences. Back Exam:  Inspection: Sits in a flexed posture. Appears in pain.  Motion: Flexion 45 deg, Extension 45 deg, Side Bending to 45 deg bilaterally,  Rotation to 45 deg bilaterally  SLR laying: Negative  XSLR laying: Negative  Palpable tenderness: Tender to palpation in the midline along the midthoracic spine as well as in the right paralumbar muscles. FABER: negative. Sensory change: Gross sensation intact  to all lumbar and sacral dermatomes.  Reflexes: 2+ at both patellar tendons, 2+ at achilles tendons, Babinski's downgoing.  Strength at foot  Plantar-flexion: 5/5 Dorsi-flexion: 5/5 Eversion: 5/5 Inversion: 5/5  Leg strength  Quad: 5/5 Hamstring: 5/5 Hip flexor: 5/5 Hip abductors: 5/5  Gait unremarkable.  Procedure:  Injection of 2 painful trigger points Consent obtained and verified. Time-out conducted. Noted no overlying erythema, induration, or other signs of local infection. Skin prepped in a sterile fashion. Topical analgesic spray: Ethyl chloride. Completed without difficulty. Meds: A total 1 cc Kenalog 40, 4 cc lidocaine spread out between the 2 painful trigger points. Patient had 75% improvement of pain at the end of the visit. Advised to call if fevers/chills, erythema, induration, drainage, or persistent bleeding.  Impression and Recommendations:

## 2013-08-24 ENCOUNTER — Encounter: Payer: Self-pay | Admitting: Sports Medicine

## 2013-08-24 ENCOUNTER — Ambulatory Visit (INDEPENDENT_AMBULATORY_CARE_PROVIDER_SITE_OTHER): Payer: 59 | Admitting: Sports Medicine

## 2013-08-24 VITALS — BP 123/83 | HR 77 | Wt 280.0 lb

## 2013-08-24 DIAGNOSIS — M5136 Other intervertebral disc degeneration, lumbar region: Secondary | ICD-10-CM

## 2013-08-24 DIAGNOSIS — M51369 Other intervertebral disc degeneration, lumbar region without mention of lumbar back pain or lower extremity pain: Secondary | ICD-10-CM

## 2013-08-24 DIAGNOSIS — M51379 Other intervertebral disc degeneration, lumbosacral region without mention of lumbar back pain or lower extremity pain: Secondary | ICD-10-CM

## 2013-08-24 DIAGNOSIS — M5137 Other intervertebral disc degeneration, lumbosacral region: Secondary | ICD-10-CM

## 2013-08-24 MED ORDER — OXYCODONE-ACETAMINOPHEN 5-325 MG PO TABS: 1.0000 | ORAL_TABLET | Freq: Three times a day (TID) | ORAL | Status: DC | PRN

## 2013-08-24 NOTE — Assessment & Plan Note (Signed)
Bryan Harvey is status post an L5-S1 fusion. Currently his having bilateral radicular symptoms in more of an L4 distribution. He has had a left sided L4-L5 interlaminar epidural which provided a 60% temporary response, subsequently he had a bilateral L4-L5 transforaminal epidural steroid injection which also provided only temporary response. Pain is currently worse in his legs and his back, he has failed formal physical therapy for months, NSAIDs, muscle relaxers, narcotics. At this point I do think he is a surgical candidate, I would like him to see Dr. Estill Bamberg at [facility].

## 2013-08-24 NOTE — Progress Notes (Signed)
  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 He is status post L5-S1 fusion, more recently he developed bilateral radicular symptoms in more of an L4 distribution. Initially we did a interlaminar epidural steroid injection at the L4-L5 level there provided with temporary response. Afterwards we did an additional bilateral transforaminal L4-L5 epidural which also provided 60% pain relief temporarily, maybe 1-2 weeks. Unfortunately he continues to have pain, legs worse than that, and has failed months of formal physical therapy, oral steroids, muscle relaxers, insect, narcotics.  Past medical history, Surgical history, Family history not pertinant except as noted below, Social history, Allergies, and medications have been entered into the medical record, reviewed, and no changes needed.   Review of Systems: No fevers, chills, night sweats, weight loss, chest pain, or shortness of breath.   Objective:    General: Well Developed, well nourished, and in no acute distress.  Neuro: Alert and oriented x3, extra-ocular muscles intact, sensation grossly intact.  HEENT: Normocephalic, atraumatic, pupils equal round reactive to light, neck supple, no masses, no lymphadenopathy, thyroid nonpalpable.  Skin: Warm and dry, no rashes. Cardiac: Regular rate and rhythm, no murmurs rubs or gallops, no lower extremity edema.  Respiratory: Clear to auscultation bilaterally. Not using accessory muscles, speaking in full sentences.  Impression and Recommendations:

## 2013-08-27 ENCOUNTER — Ambulatory Visit: Payer: 59 | Admitting: Sports Medicine

## 2013-08-29 ENCOUNTER — Other Ambulatory Visit: Payer: Self-pay | Admitting: Family Medicine

## 2013-09-03 ENCOUNTER — Telehealth: Payer: Self-pay

## 2013-09-03 NOTE — Telephone Encounter (Signed)
He saw the spine surgeon on Friday and they do want to do surgery. Does he need another MRI since he hurt himself again picking up the printer. Just in case there is more than 2 areas needing repair.

## 2013-09-03 NOTE — Telephone Encounter (Signed)
Patient advised.

## 2013-09-03 NOTE — Telephone Encounter (Signed)
No he doesn't, and even if there was a new area that was injured, it would need to be treated as if it was brand-new, starting with physical therapy, steroids et Karie Soda. An example, a new disc herniation does not need immediate surgery.

## 2013-09-06 ENCOUNTER — Ambulatory Visit (INDEPENDENT_AMBULATORY_CARE_PROVIDER_SITE_OTHER): Payer: 59 | Admitting: Sports Medicine

## 2013-09-06 ENCOUNTER — Encounter: Payer: Self-pay | Admitting: Sports Medicine

## 2013-09-06 VITALS — BP 124/87 | HR 99 | Wt 279.0 lb

## 2013-09-06 DIAGNOSIS — M5137 Other intervertebral disc degeneration, lumbosacral region: Secondary | ICD-10-CM

## 2013-09-06 DIAGNOSIS — M51369 Other intervertebral disc degeneration, lumbar region without mention of lumbar back pain or lower extremity pain: Secondary | ICD-10-CM

## 2013-09-06 DIAGNOSIS — M5136 Other intervertebral disc degeneration, lumbar region: Secondary | ICD-10-CM

## 2013-09-06 DIAGNOSIS — M51379 Other intervertebral disc degeneration, lumbosacral region without mention of lumbar back pain or lower extremity pain: Secondary | ICD-10-CM

## 2013-09-06 MED ORDER — GABAPENTIN 300 MG PO CAPS: ORAL_CAPSULE | ORAL | Status: DC

## 2013-09-06 MED ORDER — OXYCODONE-ACETAMINOPHEN 7.5-325 MG PO TABS
1.0000 | ORAL_TABLET | Freq: Three times a day (TID) | ORAL | Status: DC | PRN
Start: 2013-09-06 — End: 2013-09-21

## 2013-09-06 NOTE — Progress Notes (Signed)
  Subjective:    CC: Followup  HPI: Lumbar degenerative disc disease: Bryan Harvey comes back, he saw Dr. Yevette Edwards with spine surgery, he recommended a two-level fusion, to recap, he has had physical therapy, steroids, NSAIDs, muscle relaxers, narcotics, epidural injections were temporary beneficial for several days, unfortunately the pain returned. He is already status post a single level lumbar decompression. He does have increasing pain and needs a refill on his pain medication.  Past medical history, Surgical history, Family history not pertinant except as noted below, Social history, Allergies, and medications have been entered into the medical record, reviewed, and no changes needed.   Review of Systems: No fevers, chills, night sweats, weight loss, chest pain, or shortness of breath.   Objective:    General: Well Developed, well nourished, and in no acute distress.  Neuro: Alert and oriented x3, extra-ocular muscles intact, sensation grossly intact.  HEENT: Normocephalic, atraumatic, pupils equal round reactive to light, neck supple, no masses, no lymphadenopathy, thyroid nonpalpable.  Skin: Warm and dry, no rashes. Cardiac: Regular rate and rhythm, no murmurs rubs or gallops, no lower extremity edema.  Respiratory: Clear to auscultation bilaterally. Not using accessory muscles, speaking in full sentences. Impression and Recommendations:

## 2013-09-06 NOTE — Assessment & Plan Note (Addendum)
Refilling pain medicine, oxycodone 7.5, #90, needs to last one month. He did see Dr. Yevette Edwards, who recommended a two-level fusion, I agree with this. Bryan Harvey is going to think about it but I do believe he will proceed. We also discussed starting gabapentin which should be quite effective.

## 2013-09-06 NOTE — Addendum Note (Signed)
Addended by: Monica Becton on: 09/06/2013 07:01 PM   Modules accepted: Orders

## 2013-09-21 ENCOUNTER — Telehealth: Payer: Self-pay

## 2013-09-21 MED ORDER — HYDROCODONE-ACETAMINOPHEN 10-325 MG PO TABS: 1.0000 | ORAL_TABLET | Freq: Three times a day (TID) | ORAL | Status: DC | PRN

## 2013-09-21 NOTE — Telephone Encounter (Addendum)
Have Bryan Harvey returned a prescription for oxycodone for destruction, and I will print him out hydrocodone. Rx is in my box.

## 2013-09-21 NOTE — Telephone Encounter (Signed)
Left message on patient vm to return oxycodone in order to get hydrocodone. Nilah Belcourt,CMA

## 2013-09-21 NOTE — Telephone Encounter (Signed)
Patient called stated the he wanted a Rx for the hydrocodone 10-325  Which he was on before because the Oxycodone is making him real queezy and making his stomach upset.Bryan Harvey,CMA

## 2013-10-02 ENCOUNTER — Other Ambulatory Visit: Payer: Self-pay | Admitting: Family Medicine

## 2013-10-04 ENCOUNTER — Telehealth: Payer: Self-pay

## 2013-10-04 NOTE — Telephone Encounter (Signed)
Patient called requesting refill for Oxycodone. 

## 2013-10-16 ENCOUNTER — Other Ambulatory Visit: Payer: Self-pay | Admitting: Orthopedic Surgery

## 2013-10-17 ENCOUNTER — Encounter (HOSPITAL_COMMUNITY): Payer: Self-pay | Admitting: Pharmacy Technician

## 2013-10-22 ENCOUNTER — Other Ambulatory Visit: Payer: Self-pay | Admitting: Family Medicine

## 2013-10-25 ENCOUNTER — Ambulatory Visit (HOSPITAL_COMMUNITY)
Admission: RE | Admit: 2013-10-25 | Discharge: 2013-10-25 | Disposition: A | Discharge status: Home / Self Care | Payer: 59 | Source: Ambulatory Visit | Attending: Orthopedic Surgery | Admitting: Orthopedic Surgery

## 2013-10-25 ENCOUNTER — Encounter (HOSPITAL_COMMUNITY): Payer: Self-pay

## 2013-10-25 ENCOUNTER — Encounter (HOSPITAL_COMMUNITY)
Admission: RE | Admit: 2013-10-25 | Discharge: 2013-10-25 | Disposition: A | Discharge status: Home / Self Care | Payer: 59 | Source: Ambulatory Visit | Attending: Orthopedic Surgery | Admitting: Orthopedic Surgery

## 2013-10-25 DIAGNOSIS — Z01818 Encounter for other preprocedural examination: Secondary | ICD-10-CM | POA: Insufficient documentation

## 2013-10-25 DIAGNOSIS — R9431 Abnormal electrocardiogram [ECG] [EKG]: Secondary | ICD-10-CM | POA: Insufficient documentation

## 2013-10-25 DIAGNOSIS — Z01810 Encounter for preprocedural cardiovascular examination: Secondary | ICD-10-CM | POA: Insufficient documentation

## 2013-10-25 HISTORY — DX: Other specified postprocedural states: Z98.890

## 2013-10-25 HISTORY — DX: Other specified postprocedural states: R11.2

## 2013-10-25 LAB — COMPREHENSIVE METABOLIC PANEL
Albumin: 3.9 g/dL (ref 3.5–5.2)
Alkaline Phosphatase: 68 U/L (ref 39–117)
BUN: 19 mg/dL (ref 6–23)
Chloride: 103 mEq/L (ref 96–112)
Creatinine, Ser: 1.16 mg/dL (ref 0.50–1.35)
GFR calc Af Amer: 86 mL/min — ABNORMAL LOW (ref >90)
Glucose, Bld: 94 mg/dL (ref 70–99)
Potassium: 3.8 mEq/L (ref 3.5–5.1)
Sodium: 139 mEq/L (ref 135–145)
Total Bilirubin: 0.2 mg/dL — ABNORMAL LOW (ref 0.3–1.2)
Total Protein: 7 g/dL (ref 6.0–8.3)

## 2013-10-25 LAB — CBC WITH DIFFERENTIAL/PLATELET
Basophils Relative: 1 % (ref 0–1)
Eosinophils Absolute: 0.2 10*3/uL (ref 0.0–0.7)
HCT: 39.2 % (ref 39.0–52.0)
Hemoglobin: 13.5 g/dL (ref 13.0–17.0)
Lymphs Abs: 1.6 10*3/uL (ref 0.7–4.0)
MCH: 31.3 pg (ref 26.0–34.0)
MCHC: 34.4 g/dL (ref 30.0–36.0)
MCV: 91 fL (ref 78.0–100.0)
Monocytes Absolute: 0.6 10*3/uL (ref 0.1–1.0)
Monocytes Relative: 8 % (ref 3–12)
Neutrophils Relative %: 69 % (ref 43–77)
RBC: 4.31 MIL/uL (ref 4.22–5.81)
RDW: 12.5 % (ref 11.5–15.5)

## 2013-10-25 LAB — URINALYSIS, ROUTINE W REFLEX MICROSCOPIC
Bilirubin Urine: NEGATIVE
Glucose, UA: NEGATIVE mg/dL
Hgb urine dipstick: NEGATIVE
Ketones, ur: NEGATIVE mg/dL
Nitrite: NEGATIVE
Specific Gravity, Urine: 1.034 — ABNORMAL HIGH (ref 1.005–1.030)
pH: 5.5 (ref 5.0–8.0)

## 2013-10-25 LAB — TYPE AND SCREEN: Antibody Screen: NEGATIVE

## 2013-10-25 LAB — SURGICAL PCR SCREEN: Staphylococcus aureus: NEGATIVE

## 2013-10-25 LAB — PROTIME-INR
INR: 0.86 (ref 0.00–1.49)
Prothrombin Time: 11.6 seconds (ref 11.6–15.2)

## 2013-10-25 LAB — ABO/RH: ABO/RH(D): O POS

## 2013-10-25 NOTE — Pre-Procedure Instructions (Signed)
Bryan Harvey  10/25/2013   Your procedure is scheduled on:  Thursday December 11 th at 0730 AM  Report to [facility]" at 0530 AM.  Call this number if you have problems the morning of surgery: [phone]   Remember:   Do not eat food or drink liquids after midnight.   Take these medicines the morning of surgery with A SIP OF WATER: Gabapentin, Hydrocodone if  Needed for pain and Zegrerid   Do not wear jewelry.  Do not wear lotions, powders, or perfumes. You may wear deodorant.             Men may shave face and neck.  Do not bring valuables to the hospital.  [facility] is not responsible   for any belongings or valuables.               Contacts, dentures or bridgework may not be worn into surgery.  Leave suitcase in the car. After surgery it may be brought to your room.  For patients admitted to the hospital, discharge time is determined by your  treatment team.               Patients discharged the day of surgery will not be allowed to drive home.    Special Instructions: Incentive Spirometry - Practice and bring it with you on the day of surgery. Shower using CHG 2 nights before surgery and the night before surgery.  If you shower the day of surgery use CHG.  Use special wash - you have one bottle of CHG for all showers.  You should use approximately 1/3 of the bottle for each shower.   Please read over the following fact sheets that you were given: Pain Booklet, Coughing and Deep Breathing, Blood Transfusion Information, MRSA Information and Surgical Site Infection Prevention

## 2013-10-26 ENCOUNTER — Telehealth: Payer: Self-pay

## 2013-10-26 MED ORDER — HYDROCODONE-ACETAMINOPHEN 10-325 MG PO TABS: 1.0000 | ORAL_TABLET | Freq: Three times a day (TID) | ORAL | Status: DC | PRN

## 2013-10-26 NOTE — Progress Notes (Signed)
Anesthesia Chart Review:  Patient is a 47 year old male scheduled for right sided L4-5 transforaminal lumbar interbody fusion on 11/01/13 by Dr. Yevette Edwards.  History includes obesity, non-smoker, post-operative N/V, GERD, HLD, GERD, hiatal hernia, tonsillectomy, back surgery. PCP is listed as Dr. Gregary Signs Hommel--last visit with Dr. Pearletha Alfred who agrees with plans for surgery.  EKG on 10/25/13 showed NSR, cannot rule out anterior infarct (age undetermined).  Currently, there are no comparison EKGs available.  No CV symptoms documented at her PAT visit. She has no known CAD/MI/CHF or DM history.  CXR on 10/25/13 showed no active cardiopulmonary disease.  Preoperative labs noted.  Further evaluation by her assigned anesthesiologist on the day of surgery.  If no acute changes on new CV symptoms then I would anticipate that she could proceed as planned.  Velna Ochs [facility]/Anesthesiology Phone [phone] 10/26/2013 3:57 PM

## 2013-10-26 NOTE — Telephone Encounter (Signed)
Small prescription of hydrocodone will be in my box, he does have a major back surgery coming up in a few days.

## 2013-10-26 NOTE — Telephone Encounter (Signed)
Bryan Harvey woke this morning with low back pain that radiates down right leg. Can he get pain medication for this? He has been seen before for this problem.

## 2013-10-29 ENCOUNTER — Other Ambulatory Visit: Payer: Self-pay | Admitting: Orthopedic Surgery

## 2013-10-31 MED ORDER — DEXTROSE 5 % IV SOLN
3.0000 g | INTRAVENOUS | Status: AC
  Administered 2013-11-01 (×2): 3 g via INTRAVENOUS
  Filled 2013-10-31: qty 3000

## 2013-11-01 ENCOUNTER — Inpatient Hospital Stay (HOSPITAL_COMMUNITY)
Admission: RE | Admit: 2013-11-01 | Discharge: 2013-11-03 | DRG: 460 | Disposition: A | Discharge status: Home / Self Care | Payer: 59 | Source: Ambulatory Visit | Attending: Orthopedic Surgery | Admitting: Orthopedic Surgery

## 2013-11-01 ENCOUNTER — Encounter (HOSPITAL_COMMUNITY): Payer: Self-pay | Admitting: *Deleted

## 2013-11-01 ENCOUNTER — Encounter (HOSPITAL_COMMUNITY)
Admission: RE | Disposition: A | Discharge status: Home / Self Care | Payer: 59 | Source: Ambulatory Visit | Attending: Orthopedic Surgery

## 2013-11-01 ENCOUNTER — Ambulatory Visit (HOSPITAL_COMMUNITY): Payer: 59

## 2013-11-01 ENCOUNTER — Ambulatory Visit (HOSPITAL_COMMUNITY): Payer: 59 | Admitting: Anesthesiology

## 2013-11-01 ENCOUNTER — Encounter (HOSPITAL_COMMUNITY): Payer: 59 | Admitting: Vascular Surgery

## 2013-11-01 DIAGNOSIS — K219 Gastro-esophageal reflux disease without esophagitis: Secondary | ICD-10-CM | POA: Diagnosis present

## 2013-11-01 DIAGNOSIS — Z79899 Other long term (current) drug therapy: Secondary | ICD-10-CM

## 2013-11-01 DIAGNOSIS — E785 Hyperlipidemia, unspecified: Secondary | ICD-10-CM | POA: Diagnosis present

## 2013-11-01 DIAGNOSIS — M5126 Other intervertebral disc displacement, lumbar region: Principal | ICD-10-CM | POA: Diagnosis present

## 2013-11-01 DIAGNOSIS — M5410 Radiculopathy, site unspecified: Secondary | ICD-10-CM | POA: Diagnosis present

## 2013-11-01 SURGERY — POSTERIOR LUMBAR FUSION 1 LEVEL
Anesthesia: General

## 2013-11-01 MED ORDER — OXYCODONE-ACETAMINOPHEN 5-325 MG PO TABS
1.0000 | ORAL_TABLET | ORAL | Status: DC | PRN
  Administered 2013-11-02 – 2013-11-03 (×5): 2 via ORAL
  Filled 2013-11-01 (×5): qty 2

## 2013-11-01 MED ORDER — METHYLENE BLUE 1 % INJ SOLN
INTRAMUSCULAR | Status: DC | PRN
  Administered 2013-11-01: 1 mL

## 2013-11-01 MED ORDER — CEFAZOLIN SODIUM 1-5 GM-% IV SOLN
1.0000 g | Freq: Three times a day (TID) | INTRAVENOUS | Status: AC
  Administered 2013-11-01 – 2013-11-02 (×2): 1 g via INTRAVENOUS
  Filled 2013-11-01 (×2): qty 50

## 2013-11-01 MED ORDER — OMEPRAZOLE-SODIUM BICARBONATE 20-1100 MG PO CAPS: 1.0000 | ORAL_CAPSULE | Freq: Every day | ORAL | Status: DC

## 2013-11-01 MED ORDER — METHYLENE BLUE 1 % INJ SOLN
INTRAMUSCULAR | Status: AC
  Filled 2013-11-01: qty 10

## 2013-11-01 MED ORDER — PHENOL 1.4 % MT LIQD: 1.0000 | OROMUCOSAL | Status: DC | PRN

## 2013-11-01 MED ORDER — PROPOFOL 10 MG/ML IV BOLUS
INTRAVENOUS | Status: DC | PRN
  Administered 2013-11-01: 330 mg via INTRAVENOUS
  Administered 2013-11-01: 30 mg via INTRAVENOUS

## 2013-11-01 MED ORDER — SENNOSIDES-DOCUSATE SODIUM 8.6-50 MG PO TABS: 1.0000 | ORAL_TABLET | Freq: Every evening | ORAL | Status: DC | PRN

## 2013-11-01 MED ORDER — SODIUM CHLORIDE 0.9 % IJ SOLN: 3.0000 mL | Freq: Two times a day (BID) | INTRAMUSCULAR | Status: DC

## 2013-11-01 MED ORDER — MORPHINE SULFATE (PF) 1 MG/ML IV SOLN
INTRAVENOUS | Status: AC
  Administered 2013-11-01: 14:00:00
  Filled 2013-11-01: qty 25

## 2013-11-01 MED ORDER — THROMBIN 20000 UNITS EX SOLR
CUTANEOUS | Status: DC | PRN
  Administered 2013-11-01: 08:00:00 via TOPICAL

## 2013-11-01 MED ORDER — CEFAZOLIN SODIUM-DEXTROSE 2-3 GM-% IV SOLR
INTRAVENOUS | Status: AC
  Filled 2013-11-01: qty 50

## 2013-11-01 MED ORDER — ONDANSETRON HCL 4 MG/2ML IJ SOLN
4.0000 mg | INTRAMUSCULAR | Status: DC | PRN
  Filled 2013-11-01: qty 2

## 2013-11-01 MED ORDER — ONDANSETRON HCL 4 MG/2ML IJ SOLN
INTRAMUSCULAR | Status: DC | PRN
  Administered 2013-11-01: 4 mg via INTRAVENOUS

## 2013-11-01 MED ORDER — ZOLPIDEM TARTRATE 5 MG PO TABS: 5.0000 mg | ORAL_TABLET | Freq: Every evening | ORAL | Status: DC | PRN

## 2013-11-01 MED ORDER — BUPIVACAINE-EPINEPHRINE (PF) 0.25% -1:200000 IJ SOLN
INTRAMUSCULAR | Status: AC
  Filled 2013-11-01: qty 30

## 2013-11-01 MED ORDER — SODIUM CHLORIDE 0.9 % IV SOLN
INTRAVENOUS | Status: DC
  Administered 2013-11-01 – 2013-11-03 (×3): via INTRAVENOUS

## 2013-11-01 MED ORDER — LACTATED RINGERS IV SOLN
INTRAVENOUS | Status: DC | PRN
  Administered 2013-11-01 (×4): via INTRAVENOUS

## 2013-11-01 MED ORDER — HYDROMORPHONE HCL PF 1 MG/ML IJ SOLN
INTRAMUSCULAR | Status: AC
  Administered 2013-11-01: 15:00:00
  Filled 2013-11-01: qty 1

## 2013-11-01 MED ORDER — ARTIFICIAL TEARS OP OINT
TOPICAL_OINTMENT | OPHTHALMIC | Status: DC | PRN
  Administered 2013-11-01: 1 via OPHTHALMIC

## 2013-11-01 MED ORDER — DEXAMETHASONE SODIUM PHOSPHATE 4 MG/ML IJ SOLN
INTRAMUSCULAR | Status: DC | PRN
  Administered 2013-11-01: 8 mg via INTRAVENOUS

## 2013-11-01 MED ORDER — SODIUM CHLORIDE 0.9 % IJ SOLN: 9.0000 mL | INTRAMUSCULAR | Status: DC | PRN

## 2013-11-01 MED ORDER — ACETAMINOPHEN 325 MG PO TABS: 650.0000 mg | ORAL_TABLET | ORAL | Status: DC | PRN

## 2013-11-01 MED ORDER — NEOSTIGMINE METHYLSULFATE 1 MG/ML IJ SOLN
INTRAMUSCULAR | Status: DC | PRN
  Administered 2013-11-01: 3 mg via INTRAVENOUS

## 2013-11-01 MED ORDER — SUCCINYLCHOLINE CHLORIDE 20 MG/ML IJ SOLN
INTRAMUSCULAR | Status: DC | PRN
  Administered 2013-11-01: 140 mg via INTRAVENOUS

## 2013-11-01 MED ORDER — ROCURONIUM BROMIDE 100 MG/10ML IV SOLN
INTRAVENOUS | Status: DC | PRN
  Administered 2013-11-01 (×2): 10 mg via INTRAVENOUS
  Administered 2013-11-01: 50 mg via INTRAVENOUS
  Administered 2013-11-01: 10 mg via INTRAVENOUS

## 2013-11-01 MED ORDER — MORPHINE SULFATE 2 MG/ML IJ SOLN
1.0000 mg | INTRAMUSCULAR | Status: DC | PRN
  Administered 2013-11-02 (×2): 4 mg via INTRAVENOUS
  Administered 2013-11-02: 2 mg via INTRAVENOUS
  Administered 2013-11-03: 4 mg via INTRAVENOUS
  Filled 2013-11-01 (×2): qty 2
  Filled 2013-11-01: qty 1
  Filled 2013-11-01: qty 2

## 2013-11-01 MED ORDER — SODIUM CHLORIDE 0.9 % IV SOLN: 250.0000 mL | INTRAVENOUS | Status: DC

## 2013-11-01 MED ORDER — PHENYLEPHRINE HCL 10 MG/ML IJ SOLN
INTRAMUSCULAR | Status: DC | PRN
  Administered 2013-11-01: 80 ug via INTRAVENOUS
  Administered 2013-11-01: 120 ug via INTRAVENOUS

## 2013-11-01 MED ORDER — PROPOFOL INFUSION 10 MG/ML OPTIME
INTRAVENOUS | Status: DC | PRN
  Administered 2013-11-01 (×2): via INTRAVENOUS
  Administered 2013-11-01: 50 ug/kg/min via INTRAVENOUS

## 2013-11-01 MED ORDER — ALBUMIN HUMAN 5 % IV SOLN
INTRAVENOUS | Status: DC | PRN
  Administered 2013-11-01 (×2): via INTRAVENOUS

## 2013-11-01 MED ORDER — ONDANSETRON HCL 4 MG/2ML IJ SOLN
4.0000 mg | Freq: Four times a day (QID) | INTRAMUSCULAR | Status: DC | PRN
  Administered 2013-11-01: 4 mg via INTRAVENOUS
  Filled 2013-11-01: qty 2

## 2013-11-01 MED ORDER — BUPIVACAINE-EPINEPHRINE 0.25% -1:200000 IJ SOLN
INTRAMUSCULAR | Status: DC | PRN
  Administered 2013-11-01: 7 mL

## 2013-11-01 MED ORDER — GLYCOPYRROLATE 0.2 MG/ML IJ SOLN
INTRAMUSCULAR | Status: DC | PRN
  Administered 2013-11-01: 0.4 mg via INTRAVENOUS

## 2013-11-01 MED ORDER — MODAFINIL 200 MG PO TABS
200.0000 mg | ORAL_TABLET | Freq: Every day | ORAL | Status: DC
  Administered 2013-11-02 – 2013-11-03 (×2): 200 mg via ORAL
  Filled 2013-11-01 (×2): qty 1

## 2013-11-01 MED ORDER — HYDROMORPHONE HCL PF 1 MG/ML IJ SOLN
INTRAMUSCULAR | Status: DC | PRN
  Administered 2013-11-01 (×2): 0.5 mg via INTRAVENOUS

## 2013-11-01 MED ORDER — ALUM & MAG HYDROXIDE-SIMETH 200-200-20 MG/5ML PO SUSP
30.0000 mL | Freq: Four times a day (QID) | ORAL | Status: DC | PRN
  Administered 2013-11-02 (×2): 30 mL via ORAL
  Filled 2013-11-01 (×2): qty 30

## 2013-11-01 MED ORDER — MENTHOL 3 MG MT LOZG: 1.0000 | LOZENGE | OROMUCOSAL | Status: DC | PRN

## 2013-11-01 MED ORDER — DIAZEPAM 5 MG PO TABS
5.0000 mg | ORAL_TABLET | Freq: Four times a day (QID) | ORAL | Status: DC | PRN
  Administered 2013-11-01: 5 mg via ORAL

## 2013-11-01 MED ORDER — POVIDONE-IODINE 7.5 % EX SOLN
Freq: Once | CUTANEOUS | Status: DC
  Filled 2013-11-01: qty 118

## 2013-11-01 MED ORDER — ONDANSETRON HCL 4 MG/2ML IJ SOLN: 4.0000 mg | Freq: Once | INTRAMUSCULAR | Status: DC | PRN

## 2013-11-01 MED ORDER — MIDAZOLAM HCL 5 MG/5ML IJ SOLN
INTRAMUSCULAR | Status: DC | PRN
  Administered 2013-11-01: 2 mg via INTRAVENOUS

## 2013-11-01 MED ORDER — FENTANYL CITRATE 0.05 MG/ML IJ SOLN
INTRAMUSCULAR | Status: DC | PRN
  Administered 2013-11-01: 50 ug via INTRAVENOUS
  Administered 2013-11-01: 300 ug via INTRAVENOUS
  Administered 2013-11-01 (×2): 100 ug via INTRAVENOUS
  Administered 2013-11-01 (×2): 50 ug via INTRAVENOUS
  Administered 2013-11-01: 100 ug via INTRAVENOUS

## 2013-11-01 MED ORDER — ACETAMINOPHEN 650 MG RE SUPP: 650.0000 mg | RECTAL | Status: DC | PRN

## 2013-11-01 MED ORDER — SODIUM CHLORIDE 0.9 % IV SOLN
10.0000 mg | INTRAVENOUS | Status: DC | PRN
  Administered 2013-11-01: 40 ug/min via INTRAVENOUS

## 2013-11-01 MED ORDER — DIPHENHYDRAMINE HCL 12.5 MG/5ML PO ELIX
12.5000 mg | ORAL_SOLUTION | Freq: Four times a day (QID) | ORAL | Status: DC | PRN
  Filled 2013-11-01: qty 5

## 2013-11-01 MED ORDER — LIDOCAINE HCL (CARDIAC) 20 MG/ML IV SOLN
INTRAVENOUS | Status: DC | PRN
  Administered 2013-11-01: 100 mg via INTRAVENOUS

## 2013-11-01 MED ORDER — BISACODYL 5 MG PO TBEC: 5.0000 mg | DELAYED_RELEASE_TABLET | Freq: Every day | ORAL | Status: DC | PRN

## 2013-11-01 MED ORDER — ARMODAFINIL 150 MG PO TABS: 150.0000 mg | ORAL_TABLET | Freq: Every day | ORAL | Status: DC

## 2013-11-01 MED ORDER — HYDROMORPHONE HCL PF 1 MG/ML IJ SOLN
0.2500 mg | INTRAMUSCULAR | Status: DC | PRN
  Administered 2013-11-01 (×2): 0.5 mg via INTRAVENOUS

## 2013-11-01 MED ORDER — DOCUSATE SODIUM 100 MG PO CAPS
100.0000 mg | ORAL_CAPSULE | Freq: Two times a day (BID) | ORAL | Status: DC
  Administered 2013-11-01 – 2013-11-03 (×4): 100 mg via ORAL
  Filled 2013-11-01 (×6): qty 1

## 2013-11-01 MED ORDER — SODIUM CHLORIDE 0.9 % IJ SOLN: 3.0000 mL | INTRAMUSCULAR | Status: DC | PRN

## 2013-11-01 MED ORDER — PANTOPRAZOLE SODIUM 40 MG PO TBEC
40.0000 mg | DELAYED_RELEASE_TABLET | Freq: Every day | ORAL | Status: DC
  Administered 2013-11-02 – 2013-11-03 (×2): 40 mg via ORAL
  Filled 2013-11-01 (×2): qty 1

## 2013-11-01 MED ORDER — CEFAZOLIN SODIUM 1-5 GM-% IV SOLN
INTRAVENOUS | Status: AC
  Filled 2013-11-01: qty 50

## 2013-11-01 MED ORDER — MORPHINE SULFATE (PF) 1 MG/ML IV SOLN
INTRAVENOUS | Status: DC
  Administered 2013-11-01: 1.5 mg via INTRAVENOUS
  Administered 2013-11-02: 5.3 mg via INTRAVENOUS
  Administered 2013-11-02: 1.5 mg via INTRAVENOUS
  Administered 2013-11-02: 3.49 mg via INTRAVENOUS

## 2013-11-01 MED ORDER — NALOXONE HCL 0.4 MG/ML IJ SOLN
0.4000 mg | INTRAMUSCULAR | Status: DC | PRN
  Filled 2013-11-01: qty 1

## 2013-11-01 MED ORDER — DIAZEPAM 5 MG PO TABS
ORAL_TABLET | ORAL | Status: AC
  Administered 2013-11-01: 15:00:00
  Filled 2013-11-01: qty 1

## 2013-11-01 MED ORDER — GABAPENTIN 300 MG PO CAPS
300.0000 mg | ORAL_CAPSULE | Freq: Every day | ORAL | Status: DC
  Administered 2013-11-02 – 2013-11-03 (×2): 300 mg via ORAL
  Filled 2013-11-01 (×2): qty 1

## 2013-11-01 MED ORDER — FLEET ENEMA 7-19 GM/118ML RE ENEM: 1.0000 | ENEMA | Freq: Once | RECTAL | Status: AC | PRN

## 2013-11-01 MED ORDER — DIPHENHYDRAMINE HCL 50 MG/ML IJ SOLN
12.5000 mg | Freq: Four times a day (QID) | INTRAMUSCULAR | Status: DC | PRN
  Filled 2013-11-01: qty 0.25

## 2013-11-01 MED ORDER — THROMBIN 20000 UNITS EX SOLR
CUTANEOUS | Status: AC
  Filled 2013-11-01: qty 20000

## 2013-11-01 SURGICAL SUPPLY — 78 items
APL SKNCLS STERI-STRIP NONHPOA (GAUZE/BANDAGES/DRESSINGS) ×1
BENZOIN TINCTURE PRP APPL 2/3 (GAUZE/BANDAGES/DRESSINGS) ×2 IMPLANT
BLADE SURG ROTATE 9660 (MISCELLANEOUS) IMPLANT
BUR ROUND PRECISION 4.0 (BURR) ×2 IMPLANT
CAGE BULLET CONCORDE 9X9X27 (Cage) ×2 IMPLANT
CARTRIDGE OIL MAESTRO DRILL (MISCELLANEOUS) ×2 IMPLANT
CLOTH BEACON ORANGE TIMEOUT ST (SAFETY) ×2 IMPLANT
CLSR STERI-STRIP ANTIMIC 1/2X4 (GAUZE/BANDAGES/DRESSINGS) ×1 IMPLANT
CONT SPEC STER OR (MISCELLANEOUS) ×2 IMPLANT
CORDS BIPOLAR (ELECTRODE) ×2 IMPLANT
COVER SURGICAL LIGHT HANDLE (MISCELLANEOUS) ×2 IMPLANT
DIFFUSER DRILL AIR PNEUMATIC (MISCELLANEOUS) ×4 IMPLANT
DRAIN CHANNEL 15F RND FF W/TCR (WOUND CARE) IMPLANT
DRAPE C-ARM 42X72 X-RAY (DRAPES) ×2 IMPLANT
DRAPE ORTHO SPLIT 77X108 STRL (DRAPES) ×2
DRAPE POUCH INSTRU U-SHP 10X18 (DRAPES) ×2 IMPLANT
DRAPE SURG 17X23 STRL (DRAPES) ×6 IMPLANT
DRAPE SURG ORHT 6 SPLT 77X108 (DRAPES) ×1 IMPLANT
DURAPREP 26ML APPLICATOR (WOUND CARE) ×2 IMPLANT
ELECT BLADE 4.0 EZ CLEAN MEGAD (MISCELLANEOUS) ×2
ELECT CAUTERY BLADE 6.4 (BLADE) ×2 IMPLANT
ELECT REM PT RETURN 9FT ADLT (ELECTROSURGICAL) ×2
ELECTRODE BLDE 4.0 EZ CLN MEGD (MISCELLANEOUS) ×1 IMPLANT
ELECTRODE REM PT RTRN 9FT ADLT (ELECTROSURGICAL) ×1 IMPLANT
EVACUATOR SILICONE 100CC (DRAIN) IMPLANT
GAUZE SPONGE 4X4 16PLY XRAY LF (GAUZE/BANDAGES/DRESSINGS) ×8 IMPLANT
GLOVE BIO SURGEON STRL SZ7 (GLOVE) ×2 IMPLANT
GLOVE BIO SURGEON STRL SZ8 (GLOVE) ×2 IMPLANT
GLOVE BIOGEL PI IND STRL 7.5 (GLOVE) ×1 IMPLANT
GLOVE BIOGEL PI IND STRL 8 (GLOVE) ×1 IMPLANT
GLOVE BIOGEL PI INDICATOR 7.5 (GLOVE) ×1
GLOVE BIOGEL PI INDICATOR 8 (GLOVE) ×1
GOWN STRL NON-REIN LRG LVL3 (GOWN DISPOSABLE) ×4 IMPLANT
GOWN STRL REIN XL XLG (GOWN DISPOSABLE) ×2 IMPLANT
IV CATH 14GX2 1/4 (CATHETERS) ×4 IMPLANT
KIT BASIN OR (CUSTOM PROCEDURE TRAY) ×2 IMPLANT
KIT POSITION SURG JACKSON T1 (MISCELLANEOUS) ×2 IMPLANT
KIT ROOM TURNOVER OR (KITS) ×2 IMPLANT
MARKER SKIN DUAL TIP RULER LAB (MISCELLANEOUS) ×2 IMPLANT
MIX DBX 10CC 35% BONE (Bone Implant) ×2 IMPLANT
NDL SPNL 18GX3.5 QUINCKE PK (NEEDLE) ×2 IMPLANT
NEEDLE 22X1 1/2 (OR ONLY) (NEEDLE) ×2 IMPLANT
NEEDLE BONE MARROW 8GX6 FENEST (NEEDLE) IMPLANT
NEEDLE HYPO 25GX1X1/2 BEV (NEEDLE) ×2 IMPLANT
NEEDLE SPNL 18GX3.5 QUINCKE PK (NEEDLE) ×8 IMPLANT
NEURO MONITORING STIM (LABOR (TRAVEL & OVERTIME)) ×1 IMPLANT
NS IRRIG 1000ML POUR BTL (IV SOLUTION) ×4 IMPLANT
OIL CARTRIDGE MAESTRO DRILL (MISCELLANEOUS) ×4
PACK LAMINECTOMY ORTHO (CUSTOM PROCEDURE TRAY) ×2 IMPLANT
PACK UNIVERSAL I (CUSTOM PROCEDURE TRAY) ×2 IMPLANT
PAD ARMBOARD 7.5X6 YLW CONV (MISCELLANEOUS) ×4 IMPLANT
PATTIES SURGICAL .5 X1 (DISPOSABLE) ×2 IMPLANT
PATTIES SURGICAL .5X1.5 (GAUZE/BANDAGES/DRESSINGS) ×2 IMPLANT
ROD PRE BENT EXP 40MM (Rod) ×2 IMPLANT
ROD PRE LORDOSED 5.5X45 (Rod) ×1 IMPLANT
SCREW POLYAXIAL 7X45MM (Screw) ×4 IMPLANT
SCREW SET SINGLE INNER (Screw) ×4 IMPLANT
SPONGE GAUZE 4X4 12PLY (GAUZE/BANDAGES/DRESSINGS) ×2 IMPLANT
SPONGE INTESTINAL PEANUT (DISPOSABLE) ×3 IMPLANT
SPONGE SURGIFOAM ABS GEL 100 (HEMOSTASIS) ×2 IMPLANT
STRIP CLOSURE SKIN 1/2X4 (GAUZE/BANDAGES/DRESSINGS) ×4 IMPLANT
SURGIFLO TRUKIT (HEMOSTASIS) IMPLANT
SUT MNCRL AB 4-0 PS2 18 (SUTURE) ×4 IMPLANT
SUT VIC AB 0 CT1 18XCR BRD 8 (SUTURE) ×1 IMPLANT
SUT VIC AB 0 CT1 8-18 (SUTURE) ×2
SUT VIC AB 1 CT1 18XCR BRD 8 (SUTURE) ×2 IMPLANT
SUT VIC AB 1 CT1 8-18 (SUTURE) ×4
SUT VIC AB 2-0 CT2 18 VCP726D (SUTURE) ×2 IMPLANT
SYR 20CC LL (SYRINGE) ×2 IMPLANT
SYR BULB IRRIGATION 50ML (SYRINGE) ×2 IMPLANT
SYR CONTROL 10ML LL (SYRINGE) ×2 IMPLANT
SYR TB 1ML LUER SLIP (SYRINGE) ×2 IMPLANT
TAPE CLOTH SURG 6X10 WHT LF (GAUZE/BANDAGES/DRESSINGS) ×1 IMPLANT
TOWEL OR 17X24 6PK STRL BLUE (TOWEL DISPOSABLE) ×2 IMPLANT
TOWEL OR 17X26 10 PK STRL BLUE (TOWEL DISPOSABLE) ×2 IMPLANT
TRAY FOLEY CATH 16FRSI W/METER (SET/KITS/TRAYS/PACK) ×2 IMPLANT
WATER STERILE IRR 1000ML POUR (IV SOLUTION) ×2 IMPLANT
YANKAUER SUCT BULB TIP NO VENT (SUCTIONS) ×4 IMPLANT

## 2013-11-01 NOTE — Anesthesia Preprocedure Evaluation (Signed)
Anesthesia Evaluation  Patient identified by MRN, date of birth, ID band Patient awake    Reviewed: Allergy & Precautions, H&P , NPO status , Patient's Chart, lab work & pertinent test results  History of Anesthesia Complications (+) PONV  Airway       Dental   Pulmonary          Cardiovascular     Neuro/Psych    GI/Hepatic GERD-  ,  Endo/Other    Renal/GU      Musculoskeletal   Abdominal   Peds  Hematology   Anesthesia Other Findings   Reproductive/Obstetrics                           Anesthesia Physical Anesthesia Plan  ASA: I  Anesthesia Plan: General   Post-op Pain Management:    Induction: Intravenous  Airway Management Planned: Oral ETT  Additional Equipment:   Intra-op Plan:   Post-operative Plan: Extubation in OR  Informed Consent: I have reviewed the patients History and Physical, chart, labs and discussed the procedure including the risks, benefits and alternatives for the proposed anesthesia with the patient or authorized representative who has indicated his/her understanding and acceptance.     Plan Discussed with:   Anesthesia Plan Comments:         Anesthesia Quick Evaluation

## 2013-11-01 NOTE — Progress Notes (Signed)
Utilization review completed.  

## 2013-11-01 NOTE — Preoperative (Signed)
Beta Blockers   Reason not to administer Beta Blockers:Not Applicable 

## 2013-11-01 NOTE — H&P (Signed)
PREOPERATIVE H&P  Chief Complaint: right leg pain  HPI: Bryan Harvey is a 47 y.o. male who presents for ongoing right leg pain. Pain temp improved with ESIs. Patient also failed PT. MRI = recurrent right 4/5 HNP. Patient wished to proceed with a revision 4/5 decpompression and fusion.  Past Medical History  Diagnosis Date  . Back pain   . GERD (gastroesophageal reflux disease)   . Hyperlipidemia   . HYPERLIPIDEMIA 01/19/2008    Qualifier: Diagnosis of  By: Thomos Lemons    . GASTROESOPHAGEAL REFLUX, NO ESOPHAGITIS 08/30/2006    Qualifier: Diagnosis of  By: Thomos Lemons    . 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 DISC DISEASE, LUMBAR 12/28/2007    Qualifier: Diagnosis of  By: Thomos Lemons    . BACK PAIN WITH RADICULOPATHY 08/19/2008    Qualifier: Diagnosis of  By: Thomos Lemons    . PONV (postoperative nausea and vomiting)    Past Surgical History  Procedure Laterality Date  . Back surgery    . Knee cartilage surgery Left 1994  . Ankle arthroscopy Right 1998  . Tonsillectomy     History   Social History  . Marital Status: Divorced    Spouse Name: N/A    Number of Children: N/A  . Years of Education: N/A   Social History Main Topics  . Smoking status: Never Smoker   . Smokeless tobacco: Former Neurosurgeon    Types: Snuff    Quit date: 10/26/1999  . Alcohol Use: Yes     Comment: social  . Drug Use: No  . Sexual Activity: Yes   Other Topics Concern  . None   Social History Narrative  . None   Family History  Problem Relation Age of Onset  . Lymphoma Father   . Diabetes Mother    No Known Allergies Prior to Admission medications   Medication Sig Start Date End Date Taking? Authorizing Provider  gabapentin (NEURONTIN) 300 MG capsule Take 300 mg by mouth daily.   Yes Historical Provider, MD  HYDROcodone-acetaminophen (NORCO) 10-325 MG per tablet Take 1 tablet by mouth every 8 (eight) hours as needed.  10/26/13  Yes Monica Becton, MD  ibuprofen (ADVIL,MOTRIN) 200 MG tablet Take 600 mg by mouth every 6 (six) hours as needed (pain).   Yes Historical Provider, MD  NUVIGIL 150 MG tablet TAKE 1 TABLET BY MOUTH 1 HOUR PRIOR TO WORK SHIFT 10/22/13  Yes Sean Hommel, DO  Omeprazole-Sodium Bicarbonate (ZEGERID PO) Take 1 capsule by mouth daily as needed (heartburn).    Yes Historical Provider, MD  triamcinolone cream (KENALOG) 0.1 % Apply 1 application topically 2 (two) times daily as needed (eczema).   Yes Historical Provider, MD     All other systems have been reviewed and were otherwise negative with the exception of those mentioned in the HPI and as above.  Physical Exam: Filed Vitals:   11/01/13 0616  BP: 131/94  Pulse:   Temp:   Resp:     General: Alert, no acute distress Cardiovascular: No pedal edema Respiratory: No cyanosis, no use of accessory musculature GI: No organomegaly, abdomen is soft and non-tender Skin: No lesions in the area of chief complaint Neurologic: Sensation intact distally Psychiatric: Patient is competent for consent with normal mood and affect Lymphatic: No axillary or cervical lymphadenopathy  MUSCULOSKELETAL: + SLR on right  Assessment/Plan: Right leg pain Plan for  Procedure(s): Right sided lumbar 4-5 Transforaminal lumbar interbody fusion with instrumentation, allograft   Emilee Hero, MD 11/01/2013 7:17 AM

## 2013-11-01 NOTE — Progress Notes (Signed)
Full dose Morphine PCA verified by [employer] fund[employer].

## 2013-11-01 NOTE — Anesthesia Postprocedure Evaluation (Signed)
  Anesthesia Post-op Note  Patient: Bryan Harvey  Procedure(s) Performed: Procedure(s) with comments: Right sided lumbar 4-5 Transforaminal lumbar interbody fusion with instrumentation, allograft (N/A) - Right sided lumbar 4-5 Transforaminal lumbar interbody fusion with instrumentation, allograft  Patient Location: PACU  Anesthesia Type:General  Level of Consciousness: awake, oriented, sedated and patient cooperative  Airway and Oxygen Therapy: Patient Spontanous Breathing  Post-op Pain: mild  Post-op Assessment: Post-op Vital signs reviewed, Patient's Cardiovascular Status Stable, Respiratory Function Stable, Patent Airway, No signs of Nausea or vomiting and Pain level controlled  Post-op Vital Signs: stable  Complications: No apparent anesthesia complications

## 2013-11-01 NOTE — Transfer of Care (Signed)
Immediate Anesthesia Transfer of Care Note  Patient: Bryan Harvey  Procedure(s) Performed: Procedure(s) with comments: Right sided lumbar 4-5 Transforaminal lumbar interbody fusion with instrumentation, allograft (N/A) - Right sided lumbar 4-5 Transforaminal lumbar interbody fusion with instrumentation, allograft  Patient Location: PACU  Anesthesia Type:General  Level of Consciousness: awake, alert , oriented and patient cooperative  Airway & Oxygen Therapy: Patient Spontanous Breathing and Patient connected to face mask oxygen  Post-op Assessment: Report given to PACU RN, Post -op Vital signs reviewed and stable and Patient moving all extremities X 4  Post vital signs: Reviewed and stable  Complications: No apparent anesthesia complications

## 2013-11-02 MED FILL — Heparin Sodium (Porcine) Inj 1000 Unit/ML: INTRAMUSCULAR | Qty: 30 | Status: AC

## 2013-11-02 MED FILL — Sodium Chloride IV Soln 0.9%: INTRAVENOUS | Qty: 1000 | Status: AC

## 2013-11-02 NOTE — Evaluation (Signed)
Physical Therapy Evaluation Patient Details Name: Bryan Harvey MRN: [id number] DOB: [birth year] Today's Date: 11/02/2013 Time: 8657-8469 PT Time Calculation (min): 22 min  PT Assessment / Plan / Recommendation History of Present Illness  Pt is a 47 y/o male s/p L4-5 decompression, due to recurrent disc herniation and Right L5 radiculopathy.  Clinical Impression  This patient presents with acute pain and decreased functional independence following the above mentioned procedure. At the time of PT eval, pt required supervision for functional mobility, and VC's to maintain back precautions. This patient is appropriate for skilled PT interventions in the acute care setting to address functional limitations, improve safety and independence with functional mobility, and return to PLOF.     PT Assessment  Patient needs continued PT services    Follow Up Recommendations  Outpatient PT (When appropriate according to post-op protocol )    Does the patient have the potential to tolerate intense rehabilitation      Barriers to Discharge        Equipment Recommendations  Rolling walker with 5" wheels    Recommendations for Other Services     Frequency Min 5X/week    Precautions / Restrictions Precautions Precautions: Back;Fall Precaution Booklet Issued: No (Issued by OT in previous session.) Precaution Comments: Reviewed log roll technique and back precautions.  Restrictions Weight Bearing Restrictions: No   Pertinent Vitals/Pain 5/10 at rest      Mobility  Bed Mobility Bed Mobility: Rolling Left;Left Sidelying to Sit;Sitting - Scoot to [place] of Bed;Sit to Sidelying Left;Rolling Right Rolling Left: 5: Supervision Left Sidelying to Sit: 5: Supervision;HOB flat Sitting - Scoot to Edge of Bed: 5: Supervision Sit to Sidelying Left: 5: Supervision Scooting to [facility]: 5: Supervision Details for Bed Mobility Assistance: VC's to maintain back precautions during log roll and transition  to sitting on EOB. Transfers Transfers: Sit to Stand;Stand to Sit Sit to Stand: 5: Supervision;From bed;From elevated surface;With upper extremity assist Stand to Sit: 5: Supervision;To bed;To elevated surface;With upper extremity assist Details for Transfer Assistance: VC's for hand placement and to maintain back precautions while performing transfers. Ambulation/Gait Ambulation/Gait Assistance: 5: Supervision Ambulation Distance (Feet): 125 Feet Assistive device: Rolling walker Ambulation/Gait Assistance Details: VC's for improved posture to maintain back precautions.  Gait Pattern: Within Functional Limits Gait velocity: Decreased General Gait Details: RW use for energy conservation and improved posture. Pt does not necessarily need it for balance or safety. Stairs: No Wheelchair Mobility Wheelchair Mobility: No    Exercises     PT Diagnosis: Difficulty walking;Acute pain  PT Problem List: Decreased strength;Decreased range of motion;Decreased activity tolerance;Decreased balance;Decreased mobility;Decreased knowledge of use of DME;Decreased safety awareness;Pain PT Treatment Interventions: DME instruction;Gait training;Stair training;Functional mobility training;Therapeutic activities;Therapeutic exercise;Neuromuscular re-education;Patient/family education     PT Goals(Current goals can be found in the care plan section) Acute Rehab PT Goals Patient Stated Goal: Go home w/ girlfriend/family assist PT Goal Formulation: With patient/family Time For Goal Achievement: 11/09/13 Potential to Achieve Goals: Good  Visit Information  Last PT Received On: 11/02/13 Assistance Needed: +1 History of Present Illness: Pt is a 47 y/o male s/p L4-5 decompression, due to recurrent disc herniation and Right L5 radiculopathy.       Prior Functioning  Home Living Family/patient expects to be discharged to:: Private residence Living Arrangements: Other (Comment) (Girlfriend) Available Help  at Discharge: Family;Available 24 hours/day Type of Home: House Home Access: Level entry Entrance Stairs-Number of Steps: level entry Home Layout: One level Home Equipment: None Prior Function Level  of Independence: Independent Communication Communication: No difficulties Dominant Hand: Right    Cognition  Cognition Arousal/Alertness: Awake/alert Behavior During Therapy: WFL for tasks assessed/performed Overall Cognitive Status: Within Functional Limits for tasks assessed    Extremity/Trunk Assessment Upper Extremity Assessment Upper Extremity Assessment: Overall WFL for tasks assessed Lower Extremity Assessment Lower Extremity Assessment: Overall WFL for tasks assessed Cervical / Trunk Assessment Cervical / Trunk Assessment: Normal   Balance Balance Balance Assessed: Yes Static Sitting Balance Static Sitting - Balance Support: Bilateral upper extremity supported;Feet supported Static Sitting - Level of Assistance: 6: Modified independent (Device/Increase time) Static Standing Balance Static Standing - Balance Support: Bilateral upper extremity supported;No upper extremity supported Static Standing - Level of Assistance: 6: Modified independent (Device/Increase time)  End of Session PT - End of Session Equipment Utilized During Treatment: Gait belt Activity Tolerance: Patient tolerated treatment well Patient left: in bed;with call bell/phone within reach;with family/visitor present Nurse Communication: Mobility status  GP     Ruthann Cancer 11/02/2013, 4:08 PM  Ruthann Cancer, PT, DPT [phone]

## 2013-11-02 NOTE — Progress Notes (Signed)
1 Day S/P Right Sided TLIF with posterior instrumentation for recurrent disc herniation and R L5 Radiculopathy. Pt reports complet resolution of R leg pain, LBP well controlled and described as a stiffness. Pt in very good spirits this morning and ate breakfast without difficulty. Denies numbness, weakness, N/V/SOB.  BP 126/75  Pulse 77  Temp(Src) 98.2 F (36.8 C) (Oral)  Resp 16  Ht 6\' 4"  (1.93 m)  Wt 134 kg (295 lb 6.7 oz)  BMI 35.97 kg/m2  SpO2 99% Pt sitting up in bed watching [employer] and finishing breakfast comfortably. Dressing is loose and requires reinforcement, no active bleeding at this time. Pt 5/5 strength BIL LE's. NVI. SCD's in place.   1 Day S/P Right Sided TLIF with posterior instrumentation for recurrent disc herniation and R L5 Radiculopathy.  -Nursing to reinforce dressing   -Pt reports complete resolution of leg pain  -Expected PO LBP well controlled at this time   -D/C PCA and transition to PO meds (Percocet and Valium)  -PT/OT    -Back precautions at all times  -D/C home tomorrow vs Sunday pending pain control and PT progress   -Written scripts signed and in chart for D/C   -D/C instructions printed and in chart  -F/U appt in 2wks at office

## 2013-11-02 NOTE — Op Note (Signed)
Bryan Harvey, Bryan Harvey         ACCOUNT NO.:  [id number]  MEDICAL RECORD NO.:  [id number]  LOCATION:  5N22C                        FACILITY:  [facility]  PHYSICIAN:  Estill Bamberg, MD      DATE OF BIRTH:  1966/02/09  DATE OF PROCEDURE:  11/01/2013                              OPERATIVE REPORT   PREOPERATIVE DIAGNOSES: 1. 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 2. Severe right-sided L5 radiculopathy. 3. Status post previous L4-5 decompression.  POSTOPERATIVE DIAGNOSES: 1. 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 2. Severe right-sided L5 radiculopathy. 3. Status post previous L4-5 decompression.  PROCEDURE: 1. Complex right-sided transforaminal lumbar interbody fusion with     complex decompression with removal of abundant scar tissue and with     removal of very large pedunculated L4-5 disk fragment, causing     severe compression of the traversing L5 nerve. 2. Left-sided posterolateral fusion. 3. Placement of posterior instrumentation L4, L5, (7 x 45 mm screws). 4. Insertion of interbody device x1 (9 x 27 mm Concorde bullet cage). 5. Use of morselized allograft (DBX mix). 6. Use of local autograft. 7. Intraoperative use of fluoroscopy.  SURGEON:  Estill Bamberg, MD  ASSISTANT:  Jason Coop, PA-C  ANESTHESIA:  General endotracheal anesthesia.  COMPLICATIONS:  None.  DISPOSITION:  Stable.  ESTIMATED BLOOD LOSS:  300 mL.  INDICATIONS FOR PROCEDURE:  Briefly, Bryan Harvey is a very pleasant 47- year-old  who did present to me with recurrence of right leg pain.  At that time, I did evaluate him, and his pain had been present for a total of 2 months.  Of note, the patient is status post an L5-S1 fusion.  I did review an MRI, which was notable for a very large right-sided L4-5 disk fragment.  We did go forward with conservative care including Neurontin and physical therapy.  He did continue to have severe debilitating pain.  We therefore did discuss proceeding with  revision decompression and right-sided L4-5 instrumented fusion as reflected above.  The patient did fully understood the risks and limitations of the procedure as outlined in my preoperative note.  OPERATIVE DETAILS:  On November 01, 2013, the patient was brought to surgery and general endotracheal anesthesia was administered.  The patient was placed prone on a well-padded flat Jackson bed with a spinal frame.  Antibiotics were given and a time-out procedure was performed. The back was prepped and draped in the usual sterile fashion.  I did utilize the patient's previous incision, which was extended more cephalad.  The fascia was sharply incised in the midline.  The paraspinal musculature was bluntly swept laterally in both the right and left sides.  I then turned my attention towards the patient's left side. On the left, the L4-5 facet joint was subperiosteally exposed, as were the transverse processes of L4 and L5.  A 4 mm high-speed bur was used to gain entry into the L4 and L5 pedicles.  A gearshift probe was used to cannulate the pedicles.  A 6-mm tap was used and a ball-tipped probe was used to confirm that there was no cortical violation of the pedicles.  At this point, I did use a high-speed bur to decorticate the transverse processes of L4 and L5 as  well as the L4-5 facet joint.  DBX mix and autograft was placed in the posterolateral gutter on the left. A 7 x 45 mm screws were placed at L4 and at L5.  A 45 mm rod was secured into the screws and distraction was applied.  The caps were placed and provisionally tightened.  I then turned my attention towards the patient's right side.  As previously discussed, the L4 and L5 pedicles were cannulated.  The ball-tip probe was used to confirm there was no cortical violation of the pedicles.  I did use intraoperative fluoroscopy to confirm appropriate positioning of the screws on the right.  I then went forward with a complex  decompression.  I did use multiple curettes to debulk the significant scar tissue that was noted above the epidural space given the patient's previous decompression. This portion of the procedure was very meticulous and did take approximately 90 minutes, where as it normally takes approximately 5-10 minutes.  A significant amount of meticulous care was needed, to ensure that there was no dural injury and no dural defect created.  I was ultimately able to perform a thorough complete facetectomy.  The traversing L5 nerve was identified and medially mobilized.  A very large L4-5 disk fragment was identified and removed.  With an assistant holding medial retraction over the traversing L5 nerve, with safe retraction of the exiting L4 nerve, I did use a 15-blade knife to perform an annulotomy at the posterolateral aspect of the L4-5 intervertebral disk.  I did use a series of curettes to perform a thorough and complete diskectomy.  The endplates of L4 and of L5 were prepared using a series of curettes.  I was very pleased with the diskectomy that was performed.  I then utilized the autograft obtained for removing the facet joint, which was mixed with DBX mix.  The autograft/allograft mixture was packed into the intervertebral space liberally.  Of note, I did place a series of trials and did feel that a 9 x 27 mm trial would be the most appropriate fit.  The appropriate size interbody implant was then packed with autograft and allograft and tamped into position in the usual fashion.  Again, I was very pleased with the press-fit of the implant.  I then released the distraction on the contralateral side.  A 7 x 45 mm screws were placed at L4 and at L5. Rod was placed into the heads of the screws and caps were placed and a final locking procedure was performed.  Final locking procedure was then performed on the contralateral side.  I then obtained final AP and lateral fluoroscopic views and was  very pleased with the appearance of the construct.  At this point, the wound was copiously irrigated.  The epidural space was explored and there was no undue bleeding encountered. There was no extravasation of cerebrospinal fluid encountered.  I did note excellent decompression of the traversing L5 nerve.  Of note, the wound was copiously irrigated with approximately a total of 3 L of normal saline prior to closure.  The fascia was then closed using #1 Vicryl.  The subcutaneous layer was closed using 2-0 Vicryl and the skin was closed using 3-0 Monocryl.  Benzoin and Steri-Strips were applied followed by sterile dressing.  All instrument counts were correct at the termination of the procedure.  Of note, Jason Coop was my assistant throughout the entirety of the procedure, and did aid in essential retraction and suctioning needed throughout the entirety of  the surgery.     Estill Bamberg, MD     MD/MEDQ  D:  11/01/2013  T:  11/02/2013  Job:  [id number]  cc:   Monica Becton, MD

## 2013-11-02 NOTE — Evaluation (Signed)
Occupational Therapy Evaluation Patient Details Name: Bryan Harvey MRN: [id number] DOB: 1965-12-15 Today's Date: 11/02/2013 Time: 4098-1191 OT Time Calculation (min): 29 min  OT Assessment / Plan / Recommendation History of present illness Pt is a 47 y/o male s/p L4-5 decompression, right sided L5 radiculopathy on 11/01/13.   Clinical Impression   Pt presents w/ dx as above now impacting his ability to perform LB ADL's and functional transfers. Pt should benefit from acute OT for pt education, a/e instruction PRN and shower/toilet transfers prior to d/c home w/ girlfriend/family assist PRN. Pt will benefit from wide 3:1 at d/c for home use secondary to back precautions.    OT Assessment  Patient needs continued OT Services    Follow Up Recommendations  No OT follow up;Supervision - Intermittent    Barriers to Discharge      Equipment Recommendations  3 in 1 bedside comode;Other (comment) (Wide 3:1 for home use, possible a/e for LB ADL's)    Recommendations for Other Services    Frequency  Min 2X/week    Precautions / Restrictions Precautions Precautions: Back Precaution Booklet Issued: Yes (comment) Precaution Comments: Issued and reviewed w/ pt. Instructed in log roll technique for OOB Restrictions Weight Bearing Restrictions: No (Back precautions)   Pertinent Vitals/Pain Pt reports "I had pain medicine about 15 Min ago, I'm fine"    ADL  Eating/Feeding: Performed;Independent Where Assessed - Eating/Feeding: Chair Grooming: Performed;Wash/dry hands;Wash/dry face;Teeth care;Supervision/safety Where Assessed - Grooming: Supported standing;Unsupported standing Upper Body Bathing: Simulated;Set up Where Assessed - Upper Body Bathing: Supported sitting Lower Body Bathing: Simulated;Minimal assistance Where Assessed - Lower Body Bathing: Supported sit to stand Upper Body Dressing: Simulated;Set up Where Assessed - Upper Body Dressing: Supported sitting Lower Body  Dressing: Performed;Minimal assistance Where Assessed - Lower Body Dressing: Unsupported sitting;Supported sit to stand Toilet Transfer: Performed;Supervision/safety Toilet Transfer Method: Sit to Barista: Raised toilet seat with arms (or 3-in-1 over toilet) Toileting - Clothing Manipulation and Hygiene: Performed;Supervision/safety Where Assessed - Toileting Clothing Manipulation and Hygiene: Sit on 3-in-1 or toilet;Sit to stand from 3-in-1 or toilet Tub/Shower Transfer Method: Not assessed Equipment Used: Gait belt Transfers/Ambulation Related to ADLs: Pt overall supervision A for transfers and functional mobility ADL Comments: Pt was educated in role of OT, back precautions (handout issued and reviewed). Pt was overall supervision for log roll technique to sit at EOB. Pt participated in LB ADL retraining and currently requires Min assist at this time, he may be Mod I in supine vs w/ A/E cont to assess.     OT Diagnosis: Generalized weakness;Acute pain  OT Problem List: Decreased activity tolerance;Pain;Decreased knowledge of use of DME or AE;Decreased knowledge of precautions OT Treatment Interventions: Self-care/ADL training;DME and/or AE instruction;Patient/family education;Therapeutic activities   OT Goals(Current goals can be found in the care plan section) Acute Rehab OT Goals Patient Stated Goal: Go home w/ girlfriend/family assist Time For Goal Achievement: 11/16/13 Potential to Achieve Goals: Good  Visit Information  Last OT Received On: 11/02/13 History of Present Illness: Pt is a 47 y/o male s/p L4-5 decompression, right sided L5 radiculopathy on 11/01/13.       Prior Functioning     Home Living Family/patient expects to be discharged to:: Private residence Living Arrangements: Other (Comment) (Going to girlfriend first than sister's house) Available Help at Discharge: Family;Available 24 hours/day Type of Home: House Home Access: Level  entry Entrance Stairs-Number of Steps: level entry Home Layout: One level Home Equipment: None Prior Function Level of  Independence: Independent Communication Communication: No difficulties Dominant Hand: Right    Vision/Perception Vision - History Baseline Vision: Wears contacts Patient Visual Report: No change from baseline   Cognition  Cognition Arousal/Alertness: Awake/alert Behavior During Therapy: WFL for tasks assessed/performed Overall Cognitive Status: Within Functional Limits for tasks assessed    Extremity/Trunk Assessment Upper Extremity Assessment Upper Extremity Assessment: Overall WFL for tasks assessed Lower Extremity Assessment Lower Extremity Assessment: Overall WFL for tasks assessed;Defer to PT evaluation Cervical / Trunk Assessment Cervical / Trunk Assessment: Normal    Mobility Bed Mobility Bed Mobility: Rolling Left;Left Sidelying to Sit Rolling Left: 5: Supervision Left Sidelying to Sit: 5: Supervision;HOB flat Details for Bed Mobility Assistance: Pt demonstrates good tech w/ HOB falt, rails down, no physical assist. Transfers Transfers: Sit to Stand;Stand to Sit Sit to Stand: 5: Supervision;From bed;From chair/3-in-1;With upper extremity assist;With armrests Stand to Sit: 5: Supervision;To chair/3-in-1;With upper extremity assist;With armrests Details for Transfer Assistance: Pt using good hand placement & adhering to back precautions during transfers noted.        Balance Balance Balance Assessed: Yes Static Sitting Balance Static Sitting - Balance Support: Bilateral upper extremity supported;Feet supported Static Standing Balance Static Standing - Balance Support: Bilateral upper extremity supported;No upper extremity supported;During functional activity   End of Session OT - End of Session Equipment Utilized During Treatment: Gait belt Activity Tolerance: Patient tolerated treatment well Patient left: in chair;with call bell/phone within  reach Nurse Communication: Mobility status  GO     Alm Bustard 11/02/2013, 1:36 PM

## 2013-11-03 MED ORDER — INFLUENZA VAC SPLIT QUAD 0.5 ML IM SUSP
0.5000 mL | INTRAMUSCULAR | Status: AC
  Administered 2013-11-03: 0.5 mL via INTRAMUSCULAR
  Filled 2013-11-03: qty 0.5

## 2013-11-03 NOTE — Progress Notes (Signed)
Physical Therapy Treatment Patient Details Name: Bryan Harvey MRN: [id number] DOB: 1966/04/04 Today's Date: 11/03/2013 Time: 1478-2956 PT Time Calculation (min): 17 min  PT Assessment / Plan / Recommendation  History of Present Illness Pt is a 47 y/o male s/p L4-5 decompression, due to recurrent disc herniation and Right L5 radiculopathy.   PT Comments   Pt moving well.  Plans are for pt to d/c home later today per ortho PA's progress note.  Pt safe from mobility standpoint to d/c.     Follow Up Recommendations  Outpatient PT (when appropriate according to post-op protocol)     Does the patient have the potential to tolerate intense rehabilitation     Barriers to Discharge        Equipment Recommendations  Rolling walker with 5" wheels    Recommendations for Other Services    Frequency Min 5X/week   Progress towards PT Goals Progress towards PT goals: Progressing toward goals  Plan Current plan remains appropriate    Precautions / Restrictions Precautions Precautions: Back;Fall Precaution Comments: Pt able to verbalize 3/3 back precautions & did well with logrolling  Restrictions Weight Bearing Restrictions: No   Pertinent Vitals/Pain 6/10 incisional pain.      Mobility  Bed Mobility Bed Mobility: Rolling Left;Left Sidelying to Sit;Sitting - Scoot to Edge of Bed Rolling Left: 6: Modified independent (Device/Increase time) Left Sidelying to Sit: 6: Modified independent (Device/Increase time);HOB flat Sitting - Scoot to Edge of Bed: 6: Modified independent (Device/Increase time) Transfers Transfers: Sit to Stand;Stand to Sit Sit to Stand: 5: Supervision;With upper extremity assist;From bed Stand to Sit: 5: Supervision;With upper extremity assist;With armrests;To chair/3-in-1 Details for Transfer Assistance: cues to reinforce hand placement Ambulation/Gait Ambulation/Gait Assistance: 5: Supervision Ambulation Distance (Feet): 200 Feet Assistive device: Rolling  walker Ambulation/Gait Assistance Details: cues to relax UE's.  Pt with guarded gait but moves well Gait Pattern: Within Functional Limits Gait velocity: decreased General Gait Details: guarded posture due to incisional pain per pt.  Stairs: No Wheelchair Mobility Wheelchair Mobility: No      PT Goals (current goals can now be found in the care plan section) Acute Rehab PT Goals Patient Stated Goal: Go home w/ girlfriend/family assist PT Goal Formulation: With patient/family Time For Goal Achievement: 11/09/13 Potential to Achieve Goals: Good  Visit Information  Last PT Received On: 11/03/13 Assistance Needed: +1 History of Present Illness: Pt is a 47 y/o male s/p L4-5 decompression, due to recurrent disc herniation and Right L5 radiculopathy.    Subjective Data  Patient Stated Goal: Go home w/ girlfriend/family assist   Cognition  Cognition Arousal/Alertness: Awake/alert Behavior During Therapy: WFL for tasks assessed/performed Overall Cognitive Status: Within Functional Limits for tasks assessed    Balance     End of Session PT - End of Session Activity Tolerance: Patient tolerated treatment well Patient left: in chair;with call bell/phone within reach Nurse Communication: Mobility status   GP     Lara Mulch 11/03/2013, 9:49 AM  Verdell Face, PTA [phone] 11/03/2013

## 2013-11-03 NOTE — Progress Notes (Signed)
Subjective: 2 Days Post-Op Procedure(s) (LRB): Right sided lumbar 4-5 Transforaminal lumbar interbody fusion with instrumentation, allograft (N/A) Complaining of minimal back pain and no leg symptoms. Will come today. Activity level:  Ambulating Diet tolerance:  Eating Voiding:  Okay Patient reports pain as 1 on 0-10 scale.    Objective: Vital signs in last 24 hours: Temp:  [98.1 F (36.7 C)-98.2 F (36.8 C)] 98.2 F (36.8 C) (12/13 0434) Pulse Rate:  [72-92] 72 (12/13 0434) Resp:  [16-20] 20 (12/13 0434) BP: (128-135)/(80-93) 130/93 mmHg (12/13 0434) SpO2:  [96 %-99 %] 96 % (12/13 0434)  Labs: No results found for this basename: HGB,  in the last 72 hours No results found for this basename: WBC, RBC, HCT, PLT,  in the last 72 hours No results found for this basename: NA, K, CL, CO2, BUN, CREATININE, GLUCOSE, CALCIUM,  in the last 72 hours No results found for this basename: LABPT, INR,  in the last 72 hours  Physical Exam:  Neurologically intact ABD soft Neurovascular intact Sensation intact distally Intact pulses distally Dorsiflexion/Plantar flexion intact Compartment soft  Assessment/Plan:  2 Days Post-Op Procedure(s) (LRB): Right sided lumbar 4-5 Transforaminal lumbar interbody fusion with instrumentation, allograft (N/A) Advance diet Up with therapy D/C IV fluids Discharge home today. Prescriptions are on the chart already. Follow up in about 10 days.    Bryan Harvey R 11/03/2013, 8:32 AM

## 2013-11-03 NOTE — Progress Notes (Signed)
Occupational Therapy Treatment Patient Details Name: HASKELL RIHN MRN: [id number] DOB: [birth year] Today's Date: 11/03/2013 Time: 7829-5621 OT Time Calculation (min): 26 min  OT Assessment / Plan / Recommendation  History of present illness Pt is a 47 y/o male s/p L4-5 decompression, due to recurrent disc herniation and Right L5 radiculopathy.   OT comments  Pt making progress and will have prn A at home.  Follow Up Recommendations  No OT follow up;Supervision - Intermittent       Equipment Recommendations   (already has a 3n1; they may or may not get AE on their own)       Frequency Min 2X/week   Progress towards OT Goals Progress towards OT goals: Progressing toward goals  Plan Discharge plan remains appropriate    Precautions / Restrictions Precautions Precautions: Back       ADL  Lower Body Dressing: Set up;Supervision/safety (with AE socks, shoes, underwear, pants) Where Assessed - Lower Body Dressing: Unsupported sit to stand Tub/Shower Transfer: Supervision/safety Tub/Shower Transfer Method: Science writer: Shower seat with back Equipment Used: Rolling walker;Reacher;Sock aid Transfers/Ambulation Related to ADLs: S for all with RW      OT Goals(current goals can now be found in the care plan section)    Visit Information  Last OT Received On: 11/03/13 Assistance Needed: +1 History of Present Illness: Pt is a 47 y/o male s/p L4-5 decompression, due to recurrent disc herniation and Right L5 radiculopathy.          Cognition  Cognition Arousal/Alertness: Awake/alert Behavior During Therapy: WFL for tasks assessed/performed Overall Cognitive Status: Within Functional Limits for tasks assessed    Mobility  Bed Mobility Bed Mobility: Rolling Left;Left Sidelying to Sit;Sitting - Scoot to Edge of Bed Rolling Left: 6: Modified independent (Device/Increase time) Left Sidelying to Sit: 6: Modified independent (Device/Increase  time);HOB flat Sitting - Scoot to Edge of Bed: 6: Modified independent (Device/Increase time) Transfers Transfers: Sit to Stand;Stand to Sit Sit to Stand: 5: Supervision;With upper extremity assist;From bed Stand to Sit: 5: Supervision;With upper extremity assist;With armrests;To chair/3-in-1          End of Session OT - End of Session Equipment Utilized During Treatment: Rolling walker Activity Tolerance: Patient tolerated treatment well Patient left: in chair;with call bell/phone within reach;with family/visitor present Nurse Communication:  (Pt ready to bwe D/C'd from OT standpoint)       Evette Georges [phone] 11/03/2013, 4:07 PM

## 2013-11-21 NOTE — Discharge Summary (Signed)
Patient ID: DELSIN COPEN MRN: [id number] DOB/AGE: 05-14-1966 47 y.o.  Admit date: 11/01/2013 Discharge date: 11/03/2013  Admission Diagnoses:  Active Problems:   Radiculopathy   Discharge Diagnoses:  Same  Past Medical History  Diagnosis Date  . Back pain   . GERD (gastroesophageal reflux disease)   . Hyperlipidemia   . HYPERLIPIDEMIA 01/19/2008    Qualifier: Diagnosis of  By: Thomos Lemons    . GASTROESOPHAGEAL REFLUX, NO ESOPHAGITIS 08/30/2006    Qualifier: Diagnosis of  By: Thomos Lemons    . 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 DISC DISEASE, LUMBAR 12/28/2007    Qualifier: Diagnosis of  By: Thomos Lemons    . BACK PAIN WITH RADICULOPATHY 08/19/2008    Qualifier: Diagnosis of  By: Thomos Lemons    . PONV (postoperative nausea and vomiting)     Surgeries: Procedure(s): Right sided lumbar 4-5 Transforaminal lumbar interbody fusion with instrumentation, allograft on 11/01/2013   Discharged Condition: Improved  Hospital Course: SORIN FRIMPONG is an 47 y.o. male who was admitted 11/01/2013 for operative treatment of recurrent disc herniation R L5 radiculopathy. Patient has severe unremitting pain that affects sleep, daily activities, and work/hobbies. After pre-op clearance the patient was taken to the operating room on 11/01/2013 and underwent  Right sided lumbar 4-5 Transforaminal lumbar interbody fusion with instrumentation, allograft.    Patient was given perioperative antibiotics:  Anti-infectives   Start     Dose/Rate Route Frequency Ordered Stop   11/01/13 1900  ceFAZolin (ANCEF) IVPB 1 g/50 mL premix     1 g 100 mL/hr over 30 Minutes Intravenous Every 8 hours 11/01/13 1454 11/02/13 0408   11/01/13 0600  ceFAZolin (ANCEF) 3 g in dextrose 5 % 50 mL IVPB     3 g 160 mL/hr over 30 Minutes Intravenous On call to O.R. 10/31/13 1432 11/01/13 1136       Patient was given sequential compression  devices, early ambulation to prevent DVT.  Patient benefited maximally from hospital stay and there were no complications.    Recent vital signs: BP 130/93  Pulse 72  Temp(Src) 98.2 F (36.8 C) (Oral)  Resp 20  Ht 6\' 4"  (1.93 m)  Wt 134 kg (295 lb 6.7 oz)  BMI 35.97 kg/m2  SpO2 96%   Discharge Medications:     Medication List    STOP taking these medications       HYDROcodone-acetaminophen 10-325 MG per tablet  Commonly known as:  NORCO     ibuprofen 200 MG tablet  Commonly known as:  ADVIL,MOTRIN      TAKE these medications       gabapentin 300 MG capsule  Commonly known as:  NEURONTIN  Take 300 mg by mouth daily.     NUVIGIL 150 MG tablet  Generic drug:  Armodafinil  TAKE 1 TABLET BY MOUTH 1 HOUR PRIOR TO WORK SHIFT     triamcinolone cream 0.1 %  Commonly known as:  KENALOG  Apply 1 application topically 2 (two) times daily as needed (eczema).     ZEGERID PO  Take 1 capsule by mouth daily as needed (heartburn).        Diagnostic Studies: Dg Chest 2 View  10/25/2013   CLINICAL DATA:  Preop for lumbar spine surgery  EXAM: CHEST  2 VIEW  COMPARISON:  None.  FINDINGS: Limited inspiratory effect. Heart size normal. Mild bilateral lower lobe  atelectasis. No significant parenchymal opacities. No pleural effusions.  IMPRESSION: No active cardiopulmonary disease.   Electronically Signed   By: Esperanza Heir M.D.   On: 10/25/2013 17:07   Dg Lumbar Spine 2-3 Views  11/01/2013   CLINICAL DATA:  Cross-table portable view of the lumbar spine following fusion.  EXAM: LUMBAR SPINE - 2-3 VIEW  COMPARISON:  11/01/2013 at 7:54 a.m.  FINDINGS: Pedicle screws and interconnecting rods now diffuse L4-L5. There is a radiolucent disc spacer maintaining disc height at L4-L5. Intervertebral cage at L5-S1 is stable and well positioned. The orthopedic hardware is well-seated. There is no acute fracture or evidence of an operative complication.  IMPRESSION: Well-positioned fusion hardware at  the L4-L5 level as detailed.   Electronically Signed   By: Amie Portland M.D.   On: 11/01/2013 14:48   Dg Lumbar Spine 2-3 Views  11/01/2013   CLINICAL DATA:  L4-5 TLIF  EXAM: LUMBAR SPINE - 2-3 VIEW  COMPARISON:  None.  FINDINGS: Surgical instruments project within the soft tissues along the posterior aspect of the transverse process of L4 and L5. An intervertebral disc prostheses appreciated L5-S1.  IMPRESSION: L4-5 TLIF   Electronically Signed   By: Salome Holmes M.D.   On: 11/01/2013 11:42    Disposition: 01-Home or Self Care      Discharge Orders   Future Orders Complete By Expires   Call MD / Call 911  As directed    Comments:     If you experience chest pain or shortness of breath, CALL 911 and be transported to the hospital emergency room.  If you develope a fever above 101 F, pus (white drainage) or increased drainage or redness at the wound, or calf pain, call your surgeon's office.   Constipation Prevention  As directed    Comments:     Drink plenty of fluids.  Prune juice may be helpful.  You may use a stool softener, such as Colace (over the counter) 100 mg twice a day.  Use MiraLax (over the counter) for constipation as needed.   Diet - low sodium heart healthy  As directed    Increase activity slowly as tolerated  As directed      2 Days Post-Op Right sided L4-5 Transforaminal lumbar interbody fusion with instrumentation, allograft  Advance diet  Up with therapy  D/C IV fluids  Discharge home today.  Prescriptions are on the chart already percocet and valium Follow up in about 10 days.   SignedGeorga Bora 11/21/2013, 1:16 PM

## 2014-01-11 ENCOUNTER — Other Ambulatory Visit: Payer: Self-pay | Admitting: Family Medicine

## 2014-02-14 ENCOUNTER — Other Ambulatory Visit: Payer: Self-pay | Admitting: Physician Assistant

## 2014-02-14 NOTE — Telephone Encounter (Signed)
Bryan Harvey, Rx placed in in-box ready for pickup/faxing.  

## 2014-03-18 ENCOUNTER — Other Ambulatory Visit: Payer: Self-pay | Admitting: Family Medicine

## 2014-03-18 NOTE — Telephone Encounter (Signed)
Refill denied, needs f/u for refills.

## 2014-03-19 ENCOUNTER — Encounter: Payer: Self-pay | Admitting: Family Medicine

## 2014-03-19 ENCOUNTER — Ambulatory Visit (INDEPENDENT_AMBULATORY_CARE_PROVIDER_SITE_OTHER): Payer: 59 | Admitting: Family Medicine

## 2014-03-19 VITALS — BP 151/93 | HR 104 | Ht 76.0 in | Wt 292.0 lb

## 2014-03-19 DIAGNOSIS — Z1211 Encounter for screening for malignant neoplasm of colon: Secondary | ICD-10-CM

## 2014-03-19 DIAGNOSIS — Z0000 Encounter for general adult medical examination without abnormal findings: Secondary | ICD-10-CM

## 2014-03-19 DIAGNOSIS — Z800 Family history of malignant neoplasm of digestive organs: Secondary | ICD-10-CM | POA: Insufficient documentation

## 2014-03-19 DIAGNOSIS — G4726 Circadian rhythm sleep disorder, shift work type: Secondary | ICD-10-CM

## 2014-03-19 MED ORDER — ARMODAFINIL 150 MG PO TABS: 150.0000 mg | ORAL_TABLET | Freq: Every day | ORAL | Status: DC

## 2014-03-19 MED ORDER — TRIAMCINOLONE 0.1 % CREAM:EUCERIN CREAM 1:1: 1.0000 "application " | TOPICAL_CREAM | Freq: Two times a day (BID) | CUTANEOUS | Status: DC | PRN

## 2014-03-19 NOTE — Patient Instructions (Signed)
Dr. Quince Santana's General Advice Following Your Complete Physical Exam  The Benefits of Regular Exercise: Unless you suffer from an uncontrolled cardiovascular condition, studies strongly suggest that regular exercise and physical activity will add to both the quality and length of your life.  The World Health Organization recommends 150 minutes of moderate intensity aerobic activity every week.  This is best split over 3-4 days a week, and can be as simple as a brisk walk for just over 35 minutes "most days of the week".  This type of exercise has been shown to lower LDL-Cholesterol, lower average blood sugars, lower blood pressure, lower cardiovascular disease risk, improve memory, and increase one's overall sense of wellbeing.  The addition of anaerobic (or "strength training") exercises offers additional benefits including but not limited to increased metabolism, prevention of osteoporosis, and improved overall cholesterol levels.  How Can I Strive For A Low-Fat Diet?: Current guidelines recommend that 25-35 percent of your daily energy (food) intake should come from fats.  One might ask how can this be achieved without having to dissect each meal on a daily basis?  Switch to skim or 1% milk instead of whole milk.  Focus on lean meats such as ground turkey, fresh fish, baked chicken, and lean cuts of beef as your source of dietary protein.  Limit saturated fat consumption to less than 10% of your daily caloric intake.  Limit trans fatty acid consumption primarily by limiting synthetic trans fats such as partially hydrogenated oils (Ex: fried fast foods).  Substitute olive or vegetable oil for solid fats where possible.  Moderation of Salt Intake: Provided you don't carry a diagnosis of congestive heart failure nor renal failure, I recommend a daily allowance of no more than 2300 mg of salt (sodium).  Keeping under this daily goal is associated with a decreased risk of cardiovascular events, creeping  above it can lead to elevated blood pressures and increases your risk of cardiovascular events.  Milligrams (mg) of salt is listed on all nutrition labels, and your daily intake can add up faster than you think.  Most canned and frozen dinners can pack in over half your daily salt allowance in one meal.    Lifestyle Health Risks: Certain lifestyle choices carry specific health risks.  As you may already know, tobacco use has been associated with increasing one's risk of cardiovascular disease, pulmonary disease, numerous cancers, among many other issues.  What you may not know is that there are medications and nicotine replacement strategies that can more than double your chances of successfully quitting.  I would be thrilled to help manage your quitting strategy if you currently use tobacco products.  When it comes to alcohol use, I've yet to find an "ideal" daily allowance.  Provided an individual does not have a medical condition that is exacerbated by alcohol consumption, general guidelines determine "safe drinking" as no more than two standard drinks for a man or no more than one standard drink for a male per day.  However, much debate still exists on whether any amount of alcohol consumption is technically "safe".  My general advice, keep alcohol consumption to a minimum for general health promotion.  If you or others believe that alcohol, tobacco, or recreational drug use is interfering with your life, I would be happy to provide confidential counseling regarding treatment options.  General "Over The Counter" Nutrition Advice: Postmenopausal women should aim for a daily calcium intake of 1200 mg, however a significant portion of this might already be   provided by diets including milk, yogurt, cheese, and other dairy products.  Vitamin D has been shown to help preserve bone density, prevent fatigue, and has even been shown to help reduce falls in the elderly.  Ensuring a daily intake of 800 Units of  Vitamin D is a good place to start to enjoy the above benefits, we can easily check your Vitamin D level to see if you'd potentially benefit from supplementation beyond 800 Units a day.  Folic Acid intake should be of particular concern to women of childbearing age.  Daily consumption of 400-800 mcg of Folic Acid is recommended to minimize the chance of spinal cord defects in a fetus should pregnancy occur.    For many adults, accidents still remain one of the most common culprits when it comes to cause of death.  Some of the simplest but most effective preventitive habits you can adopt include regular seatbelt use, proper helmet use, securing firearms, and regularly testing your smoke and carbon monoxide detectors.  Sabel Hornbeck B. Marcellius Montagna DO [facility] [address], [address] [place], [place] [address] Phone: [phone]  Testicular Self-Exam A self-examination of your testicles involves looking at and feeling your testicles for abnormal lumps or swelling. Several things can cause swelling, lumps, or pain in your testicles. Some of these causes are:  Injuries.  Inflammation.  Infection.  Accumulation of fluids around your testicle (hydrocele).  Twisted testicles (testicular torsion).  Testicular cancer. Self-examination of the testicles and groin areas may be advised if you are at risk for testicular cancer. Risks for testicular cancer include:  An undescended testicle (cryptorchidism).  A history of previous testicular cancer.  A family history of testicular cancer. The testicles are easiest to examine after warm baths or showers and are more difficult to examine when you are cold. This is because the muscles attached to the testicles retract and pull them up higher or into the abdomen. Follow these steps while you are standing:  Hold your penis away from your body.  Roll one testicle between your thumb and forefinger, feeling the entire testicle.  Roll the other  testicle between your thumb and forefinger, feeling the entire testicle. Feel for lumps, swelling, or discomfort. A normal testicle is egg shaped and feels firm. It is smooth and not tender. The spermatic cord can be felt as a firm spaghetti-like cord at the back of your testicle. It is also important to examine the crease between the front of your leg and your abdomen. Feel for any bumps that are tender. These could be enlarged lymph nodes.  Document Released: 02/14/2001 Document Revised: 07/11/2013 Document Reviewed: 04/30/2013 [employer] Patient Information 2014 [employer].  

## 2014-03-19 NOTE — Progress Notes (Signed)
CC: Kandyce RudChristopher D Gonyea is a 48 y.o. male is here for Annual Exam   Subjective: HPI:  Colonoscopy: Strong family history, mother diagnosed in early 8750s, referral has been placed for colonoscopy Prostate: Discussed screening risks/beneifts with patient  Today. We'll consider screening for cancer at age 48.  Influenza Vaccine: Out of season Pneumovax: No current indication Td/Tdap: 2011 Zoster: (Start 48 yo)  No acute complaints today, requesting refill on nuvigil which is providing excellent resolution of fatigue occurring during his shift work. Denies appetite suppression, paranoia, anxiety nor known side effects  Requesting refill on triamcinolone and Eucerin cream currently no rashes to complain about.  Review of Systems - General ROS: negative for - chills, fever, night sweats, weight gain or weight loss Ophthalmic ROS: negative for - decreased vision Psychological ROS: negative for - anxiety or depression ENT ROS: negative for - hearing change, nasal congestion, tinnitus or allergies Hematological and Lymphatic ROS: negative for - bleeding problems, bruising or swollen lymph nodes Breast ROS: negative Respiratory ROS: no cough, shortness of breath, or wheezing Cardiovascular ROS: no chest pain or dyspnea on exertion Gastrointestinal ROS: no abdominal pain, change in bowel habits, or black or bloody stools Genito-Urinary ROS: negative for - genital discharge, genital ulcers, incontinence or abnormal bleeding from genitals Musculoskeletal ROS: negative for - joint pain or muscle pain Neurological ROS: negative for - headaches or memory loss Dermatological ROS: negative for lumps, mole changes, rash and skin lesion changes  Past Medical History  Diagnosis Date  . Back pain   . GERD (gastroesophageal reflux disease)   . Hyperlipidemia   . HYPERLIPIDEMIA 01/19/2008    Qualifier: Diagnosis of  By: Thomos LemonsBowen DO, Karen    . GASTROESOPHAGEAL REFLUX, NO ESOPHAGITIS 08/30/2006   Qualifier: Diagnosis of  By: Thomos LemonsBowen DO, Karen    . HERNIA, HIATAL, NONCONGENITAL 08/30/2006    Qualifier: Diagnosis of  By: Thomos LemonsBowen DO, Karen    . DISC DISEASE, LUMBAR 12/28/2007    Qualifier: Diagnosis of  By: Thomos LemonsBowen DO, Karen    . BACK PAIN WITH RADICULOPATHY 08/19/2008    Qualifier: Diagnosis of  By: Thomos LemonsBowen DO, Karen    . PONV (postoperative nausea and vomiting)     Past Surgical History  Procedure Laterality Date  . Back surgery    . Knee cartilage surgery Left 1994  . Ankle arthroscopy Right 1998  . Tonsillectomy     Family History  Problem Relation Age of Onset  . Lymphoma Father   . Diabetes Mother     History   Social History  . Marital Status: Divorced    Spouse Name: N/A    Number of Children: N/A  . Years of Education: N/A   Occupational History  . Not on file.   Social History Main Topics  . Smoking status: Never Smoker   . Smokeless tobacco: Former NeurosurgeonUser    Types: Snuff    Quit date: 10/26/1999  . Alcohol Use: Yes     Comment: social  . Drug Use: No  . Sexual Activity: Yes   Other Topics Concern  . Not on file   Social History Narrative  . No narrative on file     Objective: BP 151/93  Pulse 104  Ht 6\' 4"  (1.93 m)  Wt 292 lb (132.45 kg)  BMI 35.56 kg/m2  General: No Acute Distress HEENT: Atraumatic, normocephalic, conjunctivae normal without scleral icterus.  No nasal discharge, hearing grossly intact, TMs with good landmarks bilaterally with no middle ear abnormalities, posterior  pharynx clear without oral lesions. Neck: Supple, trachea midline, no cervical nor supraclavicular adenopathy. Pulmonary: Clear to auscultation bilaterally without wheezing, rhonchi, nor rales. Cardiac: Regular rate and rhythm.  No murmurs, rubs, nor gallops. No peripheral edema.  2+ peripheral pulses bilaterally. Abdomen: Bowel sounds normal.  No masses.  Non-tender without rebound.  Negative Murphy's sign. GU: Bilateral descended testes, nontender, no inguinal hernias MSK:  Grossly intact, no signs of weakness.  Full strength throughout upper and lower extremities.  Full ROM in upper and lower extremities.  No midline spinal tenderness. Neuro: Gait unremarkable, CN II-XII grossly intact.  C5-C6 Reflex 2/4 Bilaterally, L4 Reflex 2/4 Bilaterally.  Cerebellar function intact. Skin: Scattered noninflamed seborrheic keratoses on the trunk, multiple nevi none of which meet ABCD criteria for melanoma suspicion Psych: Alert and oriented to person/place/time.  Thought process normal. No anxiety/depression.  Assessment & Plan: Bryan DeerChristopher was seen today for annual exam.  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 .  Colon cancer screening - Ambulatory referral to Gastroenterology  Family history of colon cancer  Other Orders - Triamcinolone Acetonide (TRIAMCINOLONE 0.1 % CREAM : EUCERIN) CREA; Apply 1 application topically 2 (two) times daily as needed.    Healthy lifestyle interventions including but not limited to regular exercise, a healthy low fat diet, moderation of salt intake, the dangers of tobacco/alcohol/recreational drug use, nutrition supplementation, and accident avoidance were discussed with the patient and a handout was provided for future reference.   Return in about 1 month (around 04/18/2014).

## 2014-03-22 LAB — LIPID PANEL
CHOL/HDL RATIO: 12.9 ratio
Cholesterol: 349 mg/dL — ABNORMAL HIGH (ref 0–200)
HDL: 27 mg/dL — AB (ref >39)
LDL Cholesterol: 290 mg/dL — ABNORMAL HIGH (ref 0–99)
TRIGLYCERIDES: 162 mg/dL — AB (ref <150)
VLDL: 32 mg/dL (ref 0–40)

## 2014-03-25 ENCOUNTER — Telehealth: Payer: Self-pay | Admitting: Family Medicine

## 2014-03-25 ENCOUNTER — Encounter: Payer: Self-pay | Admitting: Family Medicine

## 2014-03-25 MED ORDER — TRIAMCINOLONE 0.1 % CREAM:EUCERIN CREAM 1:1: TOPICAL_CREAM | CUTANEOUS | Status: DC

## 2014-03-25 MED ORDER — ATORVASTATIN CALCIUM 40 MG PO TABS: 40.0000 mg | ORAL_TABLET | Freq: Every day | ORAL | Status: DC

## 2014-03-25 NOTE — Telephone Encounter (Signed)
Pt notified; pt states he when he picked up the triamcinalone they only gave him a very small tube. Pt needs another rx written

## 2014-03-25 NOTE — Telephone Encounter (Signed)
Bryan Harvey Will you please let patient know that his LDL cholesterol was significantly elevated putting him at risk for heart attacks in the near and distant future.  I'd strongly encourage him to start daily atorvastatin to significantly reduce this risk.  [facility], f/u for repeat cholesterol check in 3 months.

## 2014-04-22 ENCOUNTER — Encounter: Payer: Self-pay | Admitting: Family Medicine

## 2014-05-01 ENCOUNTER — Ambulatory Visit (INDEPENDENT_AMBULATORY_CARE_PROVIDER_SITE_OTHER): Payer: 59 | Admitting: Family Medicine

## 2014-05-01 ENCOUNTER — Encounter: Payer: Self-pay | Admitting: Family Medicine

## 2014-05-01 VITALS — BP 136/87 | HR 98 | Wt 267.0 lb

## 2014-05-01 DIAGNOSIS — M5380 Other specified dorsopathies, site unspecified: Secondary | ICD-10-CM

## 2014-05-01 DIAGNOSIS — M62830 Muscle spasm of back: Secondary | ICD-10-CM

## 2014-05-01 MED ORDER — CARISOPRODOL 350 MG PO TABS: 350.0000 mg | ORAL_TABLET | Freq: Three times a day (TID) | ORAL | Status: DC | PRN

## 2014-05-01 MED ORDER — MELOXICAM 15 MG PO TABS: 15.0000 mg | ORAL_TABLET | Freq: Every day | ORAL | Status: DC

## 2014-05-01 MED ORDER — KETOROLAC TROMETHAMINE 60 MG/2ML IM SOLN
60.0000 mg | Freq: Once | INTRAMUSCULAR | Status: AC
  Administered 2014-05-01: 60 mg via INTRAMUSCULAR

## 2014-05-01 NOTE — Addendum Note (Signed)
Addended by: Wyline Beady on: 05/01/2014 03:41 PM   Modules accepted: Orders

## 2014-05-01 NOTE — Progress Notes (Signed)
CC: Bryan Harvey is a 48 y.o. male is here for Back Pain   Subjective: HPI:  Complains of sudden onset of left back pain that has been present for the past one half hours it occurred suddenly after leaning forward at work. Is described as a tightness and spasm. It is moderate in severity at rest and moderate to severe when bending forward or extending. Nothing particularly relieves it, no interventions as of yet. He denies midline back pain, radicular symptoms, motor or sensory disturbances, bowel or bladder incontinence, nor saddle paresthesia. Denies abdominal pain, chest pain, nor shortness of breath   Review Of Systems Outlined In HPI  Past Medical History  Diagnosis Date  . Back pain   . GERD (gastroesophageal reflux disease)   . Hyperlipidemia   . HYPERLIPIDEMIA 01/19/2008    Qualifier: Diagnosis of  By: Thomos Lemons    . GASTROESOPHAGEAL REFLUX, NO ESOPHAGITIS 08/30/2006    Qualifier: Diagnosis of  By: Thomos Lemons    . 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 DISC DISEASE, LUMBAR 12/28/2007    Qualifier: Diagnosis of  By: Thomos Lemons    . BACK PAIN WITH RADICULOPATHY 08/19/2008    Qualifier: Diagnosis of  By: Thomos Lemons    . PONV (postoperative nausea and vomiting)     Past Surgical History  Procedure Laterality Date  . Back surgery    . Knee cartilage surgery Left 1994  . Ankle arthroscopy Right 1998  . Tonsillectomy     Family History  Problem Relation Age of Onset  . Lymphoma Father   . Diabetes Mother     History   Social History  . Marital Status: Divorced    Spouse Name: N/A    Number of Children: N/A  . Years of Education: N/A   Occupational History  . Not on file.   Social History Main Topics  . Smoking status: Never Smoker   . Smokeless tobacco: Former Neurosurgeon    Types: Snuff    Quit date: 10/26/1999  . Alcohol Use: Yes     Comment: social  . Drug Use: No  . Sexual Activity:  Yes   Other Topics Concern  . Not on file   Social History Narrative  . No narrative on file     Objective: BP 136/87  Pulse 98  Wt 267 lb (121.11 kg)  General: Alert and Oriented, No Acute Distress HEENT: Pupils equal, round, reactive to light. Conjunctivae clear.  Moist mucous membranes Lungs: Clear to auscultation bilaterally, no wheezing/ronchi/rales.  Comfortable work of breathing. Good air movement. Cardiac: Regular rate and rhythm. Normal S1/S2.  No murmurs, rubs, nor gallops.   Back: He's unable to extend his lumbar region beyond 0 vertical due to pain, unable to flex lumbar spine beyond 45 vertical due to pain. Pain is reproduced with palpation of paraspinal musculature in the lumbar and thoracic region on the right and the left which is moderately hypertonic. Extremities: No peripheral edema.  Strong peripheral pulses. Full range of motion strength in both lower extremities, L4 and S1 DTRs two over four bilaterally Mental Status: No depression, anxiety, nor agitation. Skin: Warm and dry.  Assessment & Plan: [name] was seen today for back pain.  Diagnoses and associated orders for this visit:  Back spasm - meloxicam (MOBIC) 15 MG tablet; Take 1 tablet (15 mg total) by mouth daily.  Other Orders - carisoprodol (  SOMA) 350 MG tablet; Take 1 tablet (350 mg total) by mouth 3 (three) times daily as needed for muscle spasms.    Back spasm: Continue home rehabilitation with focus on core work, given Toradol 60 mg here, start meloxicam, start Flexeril which yard he has at home and if no improvement substitute soma for Flexeril  Return if symptoms worsen or fail to improve.

## 2014-07-25 ENCOUNTER — Encounter: Payer: Self-pay | Admitting: Sports Medicine

## 2014-07-25 ENCOUNTER — Ambulatory Visit (INDEPENDENT_AMBULATORY_CARE_PROVIDER_SITE_OTHER): Payer: 59 | Admitting: Sports Medicine

## 2014-07-25 VITALS — BP 139/91 | HR 77 | Ht 76.0 in | Wt 290.0 lb

## 2014-07-25 DIAGNOSIS — M5136 Other intervertebral disc degeneration, lumbar region: Secondary | ICD-10-CM

## 2014-07-25 DIAGNOSIS — M51369 Other intervertebral disc degeneration, lumbar region without mention of lumbar back pain or lower extremity pain: Secondary | ICD-10-CM

## 2014-07-25 DIAGNOSIS — M5137 Other intervertebral disc degeneration, lumbosacral region: Secondary | ICD-10-CM

## 2014-07-25 DIAGNOSIS — M51379 Other intervertebral disc degeneration, lumbosacral region without mention of lumbar back pain or lower extremity pain: Secondary | ICD-10-CM

## 2014-07-25 MED ORDER — CYCLOBENZAPRINE HCL 10 MG PO TABS
ORAL_TABLET | ORAL | Status: DC
Start: 2014-07-25 — End: 2015-01-29

## 2014-07-25 MED ORDER — TRAMADOL HCL 50 MG PO TABS: ORAL_TABLET | ORAL | Status: DC

## 2014-07-25 MED ORDER — KETOROLAC TROMETHAMINE 30 MG/ML IJ SOLN
30.0000 mg | Freq: Once | INTRAMUSCULAR | Status: AC
  Administered 2014-07-25: 30 mg via INTRAMUSCULAR

## 2014-07-25 MED ORDER — METHYLPREDNISOLONE SODIUM SUCC 125 MG IJ SOLR
250.0000 mg | Freq: Once | INTRAMUSCULAR | Status: AC
  Administered 2014-07-25: 250 mg via INTRAMUSCULAR

## 2014-07-25 MED ORDER — METHYLPREDNISOLONE ACETATE 80 MG/ML IJ SUSP
80.0000 mg | Freq: Once | INTRAMUSCULAR | Status: AC
  Administered 2014-07-25: 80 mg via INTRAMUSCULAR

## 2014-07-25 NOTE — Progress Notes (Signed)
  Subjective:    CC: Back pain  HPI: Bryan Harvey is a very pleasant 48 year old male, he is post L4-5 and L5-S1 fusions, initially he was doing extremely well but unfortunately took a misstep and twisted. He now has pain in the mid lumbar spine and some heaviness and tingling in his legs but nothing discretely radicular. No bowel or bladder dysfunction or constitutional symptoms. Symptoms are severe, persistent.  Past medical history, Surgical history, Family history not pertinant except as noted below, Social history, Allergies, and medications have been entered into the medical record, reviewed, and no changes needed.   Review of Systems: No fevers, chills, night sweats, weight loss, chest pain, or shortness of breath.   Objective:    General: Well Developed, well nourished, and in no acute distress.  Neuro: Alert and oriented x3, extra-ocular muscles intact, sensation grossly intact.  HEENT: Normocephalic, atraumatic, pupils equal round reactive to light, neck supple, no masses, no lymphadenopathy, thyroid nonpalpable.  Skin: Warm and dry, no rashes. Cardiac: Regular rate and rhythm, no murmurs rubs or gallops, no lower extremity edema.  Respiratory: Clear to auscultation bilaterally. Not using accessory muscles, speaking in full sentences. Back Exam:  Inspection: Unremarkable  Motion: Flexion 45 deg, Extension 45 deg, Side Bending to 45 deg bilaterally,  Rotation to 45 deg bilaterally  SLR laying: Negative  XSLR laying: Negative  Palpable tenderness: None. FABER: negative. Sensory change: Gross sensation intact to all lumbar and sacral dermatomes.  Reflexes: 2+ at both patellar tendons, 2+ at achilles tendons, Babinski's downgoing.  Strength at foot  Plantar-flexion: 5/5 Dorsi-flexion: 5/5 Eversion: 5/5 Inversion: 5/5  Leg strength  Quad: 5/5 Hamstring: 5/5 Hip flexor: 5/5 Hip abductors: 5/5  Gait unremarkable.  Toradol 30 mg, Solu-Medrol 250 mg, Depo-Medrol 80 mg intramuscular  today.  Impression and Recommendations:

## 2014-07-25 NOTE — Addendum Note (Signed)
Addended by: Donne Anon L on: 07/25/2014 10:03 AM   Modules accepted: Orders

## 2014-07-25 NOTE — Assessment & Plan Note (Signed)
Was doing extremely well post L5-S1 interbody fusion at L4-L5 transforaminal lumbar interbody fusion. Unfortunately over did it recently, starting to have some back pain. Solu-Medrol 250, Toradol 30, Depo-Medrol 80, Flexeril at bedtime, tramadol as needed. Home rehabilitation exercises. Return in 3-4 weeks.

## 2014-07-30 ENCOUNTER — Other Ambulatory Visit: Payer: Self-pay | Admitting: Family Medicine

## 2014-08-06 ENCOUNTER — Ambulatory Visit (INDEPENDENT_AMBULATORY_CARE_PROVIDER_SITE_OTHER): Payer: 59 | Admitting: Family Medicine

## 2014-08-06 ENCOUNTER — Encounter: Payer: Self-pay | Admitting: Family Medicine

## 2014-08-06 VITALS — BP 141/84 | HR 116 | Wt 287.0 lb

## 2014-08-06 DIAGNOSIS — M791 Myalgia, unspecified site: Secondary | ICD-10-CM

## 2014-08-06 DIAGNOSIS — E785 Hyperlipidemia, unspecified: Secondary | ICD-10-CM

## 2014-08-06 DIAGNOSIS — R252 Cramp and spasm: Secondary | ICD-10-CM

## 2014-08-06 DIAGNOSIS — IMO0001 Reserved for inherently not codable concepts without codable children: Secondary | ICD-10-CM

## 2014-08-06 NOTE — Progress Notes (Signed)
CC: Bryan Harvey is a 48 y.o. male is here for muscle cramps and spasms   Subjective: HPI:  Accompanied by girlfriend Bryan Harvey  Patient complains of 2-3 weeks of diffuse muscle cramping mostly involving the proximal and distal upper and lower extremities but occasionally involving the upper back. Symptoms can occur at rest, with weight lifting, or casual activities around the house. When they come on they're described as moderate to severe cramping only improved with stretching and waiting minutes to hours. He reports subjective weakness after the cramps occur for an unknown amount of time mostly described as fatigue. Symptoms have not been getting better or worse overall since onset however there was slight improvement with trying to it most when they occurred. He's also tried a potassium supplement without much benefit. Most recent episode was this morning while he was at his desk. Denies any recent changes in medication other than beginning Lipitor in the spring. He's never had this before. No family history of muscle disease. Denies fevers, chills, unintentional weight loss or gain, chest pain, shortness of breath, darkening of the urine, muscle atrophy, nor any other motor or sensory disturbances other than that described above  Review Of Systems Outlined In HPI  Past Medical History  Diagnosis Date  . Back pain   . GERD (gastroesophageal reflux disease)   . Hyperlipidemia   . HYPERLIPIDEMIA 01/19/2008    Qualifier: Diagnosis of  By: Thomos Lemons    . GASTROESOPHAGEAL REFLUX, NO ESOPHAGITIS 08/30/2006    Qualifier: Diagnosis of  By: Thomos Lemons    . 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 DISC DISEASE, LUMBAR 12/28/2007    Qualifier: Diagnosis of  By: Thomos Lemons    . BACK PAIN WITH RADICULOPATHY 08/19/2008    Qualifier: Diagnosis of  By: Thomos Lemons    . PONV (postoperative nausea and vomiting)     Past Surgical  History  Procedure Laterality Date  . Back surgery    . Knee cartilage surgery Left 1994  . Ankle arthroscopy Right 1998  . Tonsillectomy     Family History  Problem Relation Age of Onset  . Lymphoma Father   . Diabetes Mother     History   Social History  . Marital Status: Divorced    Spouse Name: N/A    Number of Children: N/A  . Years of Education: N/A   Occupational History  . Not on file.   Social History Main Topics  . Smoking status: Never Smoker   . Smokeless tobacco: Former Neurosurgeon    Types: Snuff    Quit date: 10/26/1999  . Alcohol Use: Yes     Comment: social  . Drug Use: No  . Sexual Activity: Yes   Other Topics Concern  . Not on file   Social History Narrative  . No narrative on file     Objective: BP 141/84  Pulse 116  Wt 287 lb (130.182 kg)  General: Alert and Oriented, No Acute Distress HEENT: Pupils equal, round, reactive to light. Conjunctivae clear.  Moist mucous membranes pharynx unremarkable Lungs: Clear to auscultation bilaterally, no wheezing/ronchi/rales.  Comfortable work of breathing. Good air movement. Cardiac: Regular rate and rhythm. Normal S1/S2.  No murmurs, rubs, nor gallops.   Extremities: No peripheral edema.  Strong peripheral pulses. Neuro: CN II-XII grossly intact, full strength/rom of all four extremities, C5/L4/S1 DTRs 2/4 bilaterally, gait normal,  Mental  Status: No depression, anxiety, nor agitation. Skin: Warm and dry.  Assessment & Plan: Bryan Harvey was seen today for muscle cramps and spasms.  Diagnoses and associated orders for this visit:  Muscle pain - TSH - CK (Creatine Kinase) - COMPLETE METABOLIC PANEL WITH GFR - Magnesium  Muscle cramp - TSH - CK (Creatine Kinase) - COMPLETE METABOLIC PANEL WITH GFR - Magnesium  HYPERLIPIDEMIA    Muscle pain and cramping: Rule out thyroid disease, hyperglycemia, rhabdomyolysis, electrolyte abnormality, hypomagnesia,  and renal disease. Ultimate plan will be based  on the above results, prepared the patient that if all of the above are normal we'll start with a magnesium supplement. Stop Lipitor until we give the above sorted out.  Return if symptoms worsen or fail to improve.

## 2014-08-07 ENCOUNTER — Encounter: Payer: Self-pay | Admitting: Family Medicine

## 2014-08-07 DIAGNOSIS — M6282 Rhabdomyolysis: Secondary | ICD-10-CM | POA: Insufficient documentation

## 2014-08-07 DIAGNOSIS — T466X5A Adverse effect of antihyperlipidemic and antiarteriosclerotic drugs, initial encounter: Secondary | ICD-10-CM | POA: Insufficient documentation

## 2014-08-07 LAB — COMPLETE METABOLIC PANEL WITH GFR
ALK PHOS: 50 U/L (ref 39–117)
ALT: 44 U/L (ref 0–53)
AST: 22 U/L (ref 0–37)
Albumin: 4.4 g/dL (ref 3.5–5.2)
BUN: 32 mg/dL — ABNORMAL HIGH (ref 6–23)
CO2: 25 mEq/L (ref 19–32)
Calcium: 9.7 mg/dL (ref 8.4–10.5)
Chloride: 105 mEq/L (ref 96–112)
Creat: 1.51 mg/dL — ABNORMAL HIGH (ref 0.50–1.35)
GFR, EST NON AFRICAN AMERICAN: 54 mL/min — AB
GFR, Est African American: 63 mL/min
Glucose, Bld: 106 mg/dL — ABNORMAL HIGH (ref 70–99)
POTASSIUM: 4.8 meq/L (ref 3.5–5.3)
Sodium: 138 mEq/L (ref 135–145)
Total Bilirubin: 0.4 mg/dL (ref 0.2–1.2)
Total Protein: 6.9 g/dL (ref 6.0–8.3)

## 2014-08-07 LAB — TSH: TSH: 2.722 u[IU]/mL (ref 0.350–4.500)

## 2014-08-07 LAB — MAGNESIUM: MAGNESIUM: 2.2 mg/dL (ref 1.5–2.5)

## 2014-08-07 LAB — CK: Total CK: 540 U/L — ABNORMAL HIGH (ref 7–232)

## 2014-08-22 ENCOUNTER — Ambulatory Visit: Payer: 59 | Admitting: Sports Medicine

## 2014-08-29 ENCOUNTER — Other Ambulatory Visit: Payer: Self-pay

## 2014-08-29 MED ORDER — ARMODAFINIL 150 MG PO TABS: 150.0000 mg | ORAL_TABLET | Freq: Every day | ORAL | Status: DC

## 2014-10-07 ENCOUNTER — Encounter: Payer: Self-pay | Admitting: Sports Medicine

## 2014-10-07 ENCOUNTER — Ambulatory Visit (INDEPENDENT_AMBULATORY_CARE_PROVIDER_SITE_OTHER): Payer: 59 | Admitting: Sports Medicine

## 2014-10-07 ENCOUNTER — Ambulatory Visit (INDEPENDENT_AMBULATORY_CARE_PROVIDER_SITE_OTHER): Payer: 59

## 2014-10-07 VITALS — BP 125/82 | HR 102 | Ht 76.0 in | Wt 289.0 lb

## 2014-10-07 DIAGNOSIS — M25552 Pain in left hip: Secondary | ICD-10-CM

## 2014-10-07 DIAGNOSIS — E785 Hyperlipidemia, unspecified: Secondary | ICD-10-CM

## 2014-10-07 DIAGNOSIS — M160 Bilateral primary osteoarthritis of hip: Secondary | ICD-10-CM | POA: Insufficient documentation

## 2014-10-07 DIAGNOSIS — T466X5A Adverse effect of antihyperlipidemic and antiarteriosclerotic drugs, initial encounter: Secondary | ICD-10-CM

## 2014-10-07 DIAGNOSIS — M6282 Rhabdomyolysis: Secondary | ICD-10-CM

## 2014-10-07 MED ORDER — OXYCODONE-ACETAMINOPHEN 10-325 MG PO TABS: 1.0000 | ORAL_TABLET | Freq: Three times a day (TID) | ORAL | Status: DC | PRN

## 2014-10-07 MED ORDER — PITAVASTATIN CALCIUM 4 MG PO TABS: 1.0000 | ORAL_TABLET | Freq: Every day | ORAL | Status: DC

## 2014-10-07 MED ORDER — KETOROLAC TROMETHAMINE 30 MG/ML IJ SOLN
30.0000 mg | Freq: Once | INTRAMUSCULAR | Status: AC
  Administered 2014-10-07: 30 mg via INTRAMUSCULAR

## 2014-10-07 NOTE — Progress Notes (Signed)
  Subjective:    CC: left hip pain  HPI: This is a very pleasant 48 year old male, he is post-at L4-L5 and L5-S1 transforaminal lumbar interbody fusion. Overall doing extremely well, a couple of months ago he did have a flare in back pain which we treated with steroids, it then resolved. Unfortunately the past 2 weeks he's been having some increasing pain in the left hip over the lateral aspect and the groin. Moderate, persistent, waking him from sleep. Worse with ambulation. Very stiff in the mornings and does not recall any trauma.  Of note he also developed some mild rhabdomyolysis from his Lipitor. He has been off atorvastatin since.  Past medical history, Surgical history, Family history not pertinant except as noted below, Social history, Allergies, and medications have been entered into the medical record, reviewed, and no changes needed.   Review of Systems: No fevers, chills, night sweats, weight loss, chest pain, or shortness of breath.   Objective:    General: Well Developed, well nourished, and in no acute distress.  Neuro: Alert and oriented x3, extra-ocular muscles intact, sensation grossly intact.  HEENT: Normocephalic, atraumatic, pupils equal round reactive to light, neck supple, no masses, no lymphadenopathy, thyroid nonpalpable.  Skin: Warm and dry, no rashes. Cardiac: Regular rate and rhythm, no murmurs rubs or gallops, no lower extremity edema.  Respiratory: Clear to auscultation bilaterally. Not using accessory muscles, speaking in full sentences. Left Hip: ROM IR: 60 Deg, ER: 60 Deg, Flexion: 120 Deg, Extension: 100 Deg, Abduction: 45 Deg, Adduction: 45 Deg, internal rotation is very painful. Strength IR: 5/5, ER: 5/5, Flexion: 5/5, Extension: 5/5, Abduction: 5/5, Adduction: 5/5 Pelvic alignment unremarkable to inspection and palpation. Standing hip rotation and gait without trendelenburg / unsteadiness. Greater trochanter with only mild tenderness to palpation. No  tenderness over piriformis. No SI joint tenderness and normal minimal SI movement.  Impression and Recommendations:

## 2014-10-07 NOTE — Assessment & Plan Note (Signed)
Switching to Livalo.

## 2014-10-07 NOTE — Assessment & Plan Note (Signed)
Pain is referable to the femoral acetabular joint, and less so to the trochanteric bursa. 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 X-ray and MRI of the hip. Percocet for pain. Toradol intramuscular today. Return to see me to go over MRI results.

## 2014-10-07 NOTE — Assessment & Plan Note (Signed)
Continue to hold Lipitor. Rechecking CK, CMET, checking sedimentation rate. He would be a candidate for Livalo. Rx given, he will hold off until we recheck his CK levels and renal function.

## 2014-10-08 ENCOUNTER — Telehealth: Payer: Self-pay

## 2014-10-08 NOTE — Telephone Encounter (Signed)
PA required for MRI lower extremity joint without. Approval # [id number].

## 2014-10-14 ENCOUNTER — Ambulatory Visit (INDEPENDENT_AMBULATORY_CARE_PROVIDER_SITE_OTHER): Payer: 59

## 2014-10-14 DIAGNOSIS — M25852 Other specified joint disorders, left hip: Secondary | ICD-10-CM

## 2014-10-14 DIAGNOSIS — M25552 Pain in left hip: Secondary | ICD-10-CM

## 2014-10-23 ENCOUNTER — Telehealth: Payer: Self-pay

## 2014-10-23 DIAGNOSIS — M25552 Pain in left hip: Secondary | ICD-10-CM

## 2014-10-23 MED ORDER — OXYCODONE-ACETAMINOPHEN 10-325 MG PO TABS: 1.0000 | ORAL_TABLET | Freq: Three times a day (TID) | ORAL | Status: DC | PRN

## 2014-10-23 NOTE — Telephone Encounter (Signed)
Single additional rx, no further percocet.

## 2014-10-23 NOTE — Telephone Encounter (Signed)
Patient called requested a refill for Percocet. Jirah Rider,CMA

## 2014-10-24 ENCOUNTER — Other Ambulatory Visit: Payer: Self-pay

## 2014-10-24 DIAGNOSIS — M25552 Pain in left hip: Secondary | ICD-10-CM

## 2014-10-24 MED ORDER — OXYCODONE-ACETAMINOPHEN 10-325 MG PO TABS: 1.0000 | ORAL_TABLET | Freq: Three times a day (TID) | ORAL | Status: DC | PRN

## 2014-10-24 NOTE — Telephone Encounter (Signed)
Left detailed message on patient mv with instructions as noted below also advised patient that Rx was at the front ready to be picked up and no further refills would be given. Rhonda Cunningham,CMA

## 2014-10-28 ENCOUNTER — Ambulatory Visit: Payer: 59 | Admitting: Sports Medicine

## 2014-11-04 ENCOUNTER — Ambulatory Visit (INDEPENDENT_AMBULATORY_CARE_PROVIDER_SITE_OTHER): Payer: 59 | Admitting: Sports Medicine

## 2014-11-04 ENCOUNTER — Encounter: Payer: Self-pay | Admitting: Sports Medicine

## 2014-11-04 VITALS — BP 121/83 | HR 90 | Ht 76.0 in | Wt 288.0 lb

## 2014-11-04 DIAGNOSIS — M160 Bilateral primary osteoarthritis of hip: Secondary | ICD-10-CM

## 2014-11-04 DIAGNOSIS — I8002 Phlebitis and thrombophlebitis of superficial vessels of left lower extremity: Secondary | ICD-10-CM | POA: Insufficient documentation

## 2014-11-04 NOTE — Assessment & Plan Note (Signed)
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 Pain today is predominantly referable to the right femoral acetabular joint, intra-articular injection as above. Return to see me in 6 weeks.

## 2014-11-04 NOTE — Progress Notes (Signed)
  Subjective:    CC: MRI results  HPI: Bryan Harvey returns, he has right hip pain, we obtained an MRI to evaluate for AVN, the results will be dictated below. He does have pain is persistent that he describes in a C-shape from the anterior groin around the lateral hip to the posterior buttock. Pain is moderate, persistent.  Nodule on medial calf: Present for several weeks, left-sided, tender.  Past medical history, Surgical history, Family history not pertinant except as noted below, Social history, Allergies, and medications have been entered into the medical record, reviewed, and no changes needed.   Review of Systems: No fevers, chills, night sweats, weight loss, chest pain, or shortness of breath.   Objective:    General: Well Developed, well nourished, and in no acute distress.  Neuro: Alert and oriented x3, extra-ocular muscles intact, sensation grossly intact.  HEENT: Normocephalic, atraumatic, pupils equal round reactive to light, neck supple, no masses, no lymphadenopathy, thyroid nonpalpable.  Skin: Warm and dry, no rashes. Cardiac: Regular rate and rhythm, no murmurs rubs or gallops, no lower extremity edema.  Respiratory: Clear to auscultation bilaterally. Not using accessory muscles, speaking in full sentences. Right Hip: ROM IR: 60 Deg, ER: 60 Deg, Flexion: 120 Deg, Extension: 100 Deg, Abduction: 45 Deg, Adduction: 45 Deg, internal rotation causes pain. Strength IR: 5/5, ER: 5/5, Flexion: 5/5, Extension: 5/5, Abduction: 5/5, Adduction: 5/5 Pelvic alignment unremarkable to inspection and palpation. Standing hip rotation and gait without trendelenburg / unsteadiness. Greater trochanter without tenderness to palpation. No tenderness over piriformis. No SI joint tenderness and normal minimal SI movement. Left calf: There is what appears to be a palpable superficial thrombophlebitis with a palpable tender cord distal. Negative Homans sign bilaterally.  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  Procedure: Real-time Ultrasound Guided Injection of right femoral acetabular joint Device: GE Logiq E  Verbal informed consent obtained.  Time-out conducted.  Noted no overlying erythema, induration, or other signs of local infection.  Skin prepped in a sterile fashion.  Local anesthesia: Topical Ethyl chloride.  With sterile technique and under real time ultrasound guidance:  Spinal needle advanced into the femoral head/neck junction, 2 mL kenalog 40, 4 mL lidocaine injected easily. Completed without difficulty  Pain immediately resolved suggesting accurate placement of the medication.  Advised to call if fevers/chills, erythema, induration, drainage, or persistent bleeding.  Images permanently stored and available for review in the ultrasound unit.  Impression: Technically successful ultrasound guided injection.  Impression and Recommendations:

## 2014-11-04 NOTE — Assessment & Plan Note (Signed)
Palpable nodule with a tender palpable cord in the vein distally on the left leg, medial calf. Negative Homans sign, I do not suspect that this is a deep vein thrombosis however this does appear to be a superficial thrombophlebitis. Over-the-counter NSAIDs, compression stockings, ultrasound today to ensure no element of DVT.

## 2014-11-08 ENCOUNTER — Other Ambulatory Visit: Payer: 59

## 2014-11-28 LAB — HM COLONOSCOPY: HM COLON: NORMAL

## 2014-12-02 ENCOUNTER — Other Ambulatory Visit: Payer: Self-pay | Admitting: Family Medicine

## 2014-12-03 ENCOUNTER — Telehealth: Payer: Self-pay | Admitting: *Deleted

## 2014-12-03 NOTE — Telephone Encounter (Signed)
Nuvigil prior [place] approval from [facility] valid until 12/04/2015. Pharmacy notified.

## 2014-12-16 ENCOUNTER — Ambulatory Visit: Payer: 59 | Admitting: Sports Medicine

## 2014-12-16 DIAGNOSIS — Z0289 Encounter for other administrative examinations: Secondary | ICD-10-CM

## 2015-01-29 ENCOUNTER — Ambulatory Visit (INDEPENDENT_AMBULATORY_CARE_PROVIDER_SITE_OTHER): Payer: 59

## 2015-01-29 ENCOUNTER — Encounter: Payer: Self-pay | Admitting: Family Medicine

## 2015-01-29 ENCOUNTER — Ambulatory Visit (INDEPENDENT_AMBULATORY_CARE_PROVIDER_SITE_OTHER): Payer: 59 | Admitting: Family Medicine

## 2015-01-29 DIAGNOSIS — M542 Cervicalgia: Secondary | ICD-10-CM

## 2015-01-29 DIAGNOSIS — M40292 Other kyphosis, cervical region: Secondary | ICD-10-CM

## 2015-01-29 DIAGNOSIS — M25552 Pain in left hip: Secondary | ICD-10-CM

## 2015-01-29 MED ORDER — OXYCODONE-ACETAMINOPHEN 10-325 MG PO TABS: 1.0000 | ORAL_TABLET | Freq: Three times a day (TID) | ORAL | Status: DC | PRN

## 2015-01-29 MED ORDER — KETOROLAC TROMETHAMINE 60 MG/2ML IM SOLN
60.0000 mg | Freq: Once | INTRAMUSCULAR | Status: AC
  Administered 2015-01-29: 60 mg via INTRAMUSCULAR

## 2015-01-29 MED ORDER — PREDNISONE 20 MG PO TABS: ORAL_TABLET | ORAL | Status: AC

## 2015-01-29 NOTE — Progress Notes (Signed)
CC: Bryan Harvey is a 49 y.o. 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 Pain is been persistent ever since onset and worse with neck extension were quickly moving the left upper extremity. Pain is in the lateral lower aspect of the neck described further as a toothache and radiates into the right shoulder. No interventions as of yet. He's had some numbness and tingling in his middle finger that somewhat radiates up the dorsal aspect of his arm. He's also noticed some subjective weakness in the distal extremity. He's never had this before he denies any other motor or sensory disturbances other than that described above.   Review Of Systems Outlined In HPI  Past Medical History  Diagnosis Date  . Back pain   . GERD (gastroesophageal reflux disease)   . Hyperlipidemia   . HYPERLIPIDEMIA 01/19/2008    Qualifier: Diagnosis of  By: Thomos LemonsBowen DO, Karen    . GASTROESOPHAGEAL REFLUX, NO ESOPHAGITIS 08/30/2006    Qualifier: Diagnosis of  By: Thomos LemonsBowen DO, Karen    . HERNIA, HIATAL, NONCONGENITAL 08/30/2006    Qualifier: Diagnosis of  By: Thomos LemonsBowen DO, Karen    . DISC DISEASE, LUMBAR 12/28/2007    Qualifier: Diagnosis of  By: Thomos LemonsBowen DO, Karen    . BACK PAIN WITH RADICULOPATHY 08/19/2008    Qualifier: Diagnosis of  By: Thomos LemonsBowen DO, Karen    . PONV (postoperative nausea and vomiting)     Past Surgical History  Procedure Laterality Date  . Back surgery    . Knee cartilage surgery Left 1994  . Ankle arthroscopy Right 1998  . Tonsillectomy     Family History  Problem Relation Age of Onset  . Lymphoma Father   . Diabetes Mother     History   Social History  . Marital Status: Divorced    Spouse Name: N/A  . Number of Children: N/A  . Years of Education: N/A   Occupational History  . Not on file.   Social History Main Topics   . Smoking status: Never Smoker   . Smokeless tobacco: Former NeurosurgeonUser    Types: Snuff    Quit date: 10/26/1999  . Alcohol Use: Yes     Comment: social  . Drug Use: No  . Sexual Activity: Yes   Other Topics Concern  . Not on file   Social History Narrative     Objective: BP 129/87 mmHg  Pulse 97  Wt 289 lb (131.09 kg)  General: Alert and Oriented, No Acute Distress HEENT: Pupils equal, round, reactive to light. Conjunctivae clear.  Moist mucous membranes pharynx unremarkable Lungs: Clear comfortable work of breathing Cardiac: Regular rate and rhythm.  Left shoulder exam reveals full range of motion and strength in all planes of motion and with individual rotator cuff testing. No overlying redness warmth or swelling.  Neer's test negative.  Hawkins test negative. Empty can positive. Crossarm test negative. O'Brien's test negative. Apprehension test negative. Speed's test negative. Many of the tests above do cause some discomfort but he localizes in the posterior lower cervical spine and shoulder. Midline spinous process tenderness at C6 with mild paraspinal musculature tenderness but no hypertonicity. Full range of motion and strength from the elbows distally bilaterally. Extremities: No peripheral edema.  Strong peripheral pulses.  Mental Status: No depression, anxiety, nor agitation. Skin: Warm and  dry.  Assessment & Plan: Bryan Harvey was seen today for left arm pain/numbness.  Diagnoses and all orders for this visit:  Neck pain on left side Orders: -     DG Cervical Spine Complete; Future -     oxyCODONE-acetaminophen (PERCOCET) 10-325 MG per tablet; Take 1 tablet by mouth every 8 (eight) hours as needed for pain. -     predniSONE (DELTASONE) 20 MG tablet; Three tabs daily days 1-3, two tabs daily days 4-6, one tab daily days 7-9, half tab daily days 10-13. -     ketorolac (TORADOL) injection 60 mg; Inject 2 mLs (60 mg total) into the muscle once.  Cervical spine  pain Orders: -     oxyCODONE-acetaminophen (PERCOCET) 10-325 MG per tablet; Take 1 tablet by mouth every 8 (eight) hours as needed for pain. -     predniSONE (DELTASONE) 20 MG tablet; Three tabs daily days 1-3, two tabs daily days 4-6, one tab daily days 7-9, half tab daily days 10-13. -     ketorolac (TORADOL) injection 60 mg; Inject 2 mLs (60 mg total) into the muscle once.   Left-sided neck pain concerning for cervical radiculitis, given abruptness of onset getting an x-ray today to rule out fracture on the C-spine. Fortunately only mild degenerative changes. Suspicion for cervical radiculopathy as high and I have asked him to go ahead and start on prednisone now that fracture has been ruled out. Begin icing the site of discomfort and Percocet as needed. If no benefit by Friday I encouraged him to call for a appointment with our sports medicine clinic for a second opinion on the matter. He received Toradol today and did feel better after I called him an hour later.  Return if symptoms worsen or fail to improve.

## 2015-01-31 ENCOUNTER — Other Ambulatory Visit: Payer: Self-pay | Admitting: Family Medicine

## 2015-02-04 ENCOUNTER — Ambulatory Visit (INDEPENDENT_AMBULATORY_CARE_PROVIDER_SITE_OTHER): Payer: 59 | Admitting: Sports Medicine

## 2015-02-04 ENCOUNTER — Encounter: Payer: Self-pay | Admitting: Sports Medicine

## 2015-02-04 VITALS — BP 126/85 | HR 86 | Wt 287.0 lb

## 2015-02-04 DIAGNOSIS — M25512 Pain in left shoulder: Secondary | ICD-10-CM

## 2015-02-04 DIAGNOSIS — M5010 Cervical disc disorder with radiculopathy, unspecified cervical region: Secondary | ICD-10-CM

## 2015-02-04 MED ORDER — MELOXICAM 15 MG PO TABS: ORAL_TABLET | ORAL | Status: DC

## 2015-02-04 NOTE — Assessment & Plan Note (Signed)
There is both an element of left C8 cervical radiculitis as well as rotator cuff dysfunction. Subacromial injection today. He has not responded much to prednisone. Formal physical therapy for the cervical spine, MRI, return to see me go over MRI results, and likely interventional planning.

## 2015-02-04 NOTE — Progress Notes (Signed)
  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 Since that time he continues to have the sharp pain in his neck radiating down his arm to his 3, 4, 5th fingers. The pain is relieved by resting his arms behind his head while laying down. He has been taking percocet and prednisone as prescribed by Dr. Ivan AnchorsHommel but this has not relieved his pain. His XR neck 01/29/15 showed degenerative changes and loss of normal cervical lordosis.  Past medical history, Surgical history, Family history not pertinant except as noted below, Social history, Allergies, and medications have been entered into the medical record, reviewed, and no changes needed.   Review of Systems: No fevers, chills, night sweats, weight loss, chest pain, or shortness of breath.   Objective:    General: Well Developed, well nourished, and in no acute distress.  Neuro: Alert and oriented x3, extra-ocular muscles intact, sensation grossly intact.  HEENT: Normocephalic, atraumatic, pupils equal round reactive to light, neck supple, no masses, no lymphadenopathy, thyroid nonpalpable.  Skin: Warm and dry, no rashes. Cardiac: Regular rate and rhythm, no murmurs rubs or gallops, no lower extremity edema.  Respiratory: Clear to auscultation bilaterally. Not using accessory muscles, speaking in full sentences. MSK: Left Shoulder: Inspection reveals no abnormalities, atrophy or asymmetry. No tenderness to palpation of AC joint or bicipital groove. ROM is full in all planes. Supraspinatus weak to resisted flexion, Subscapularis, infraspinatus, teres minor strength normal throughout. There are signs of impingement with positive Neer, Hawkin's tests, and positive empty can  sign. Negative cross-arm test. Pain is produced with Speeds test, Yergason's test normal. No labral pathology noted with negative Obrien's, negative clunk and good stability. Crank test is positive.  Normal scapular function observed. Patient with painful arc and but no drop arm sign.  Procedure: Real-time Ultrasound Guided Injection of left subacromial bursa Device: GE Logiq E  Verbal informed consent obtained.  Time-out conducted.  Noted no overlying erythema, induration, or other signs of local infection.  Skin prepped in a sterile fashion.  Local anesthesia: Topical Ethyl chloride.  With sterile technique and under real time ultrasound guidance:  1 mL Kenalog 40, 4 mL lidocaine injected easily. Completed without difficulty  Pain immediately resolved suggesting accurate placement of the medication.  Advised to call if fevers/chills, erythema, induration, drainage, or persistent bleeding.  Images permanently stored and available for review in the ultrasound unit.  Impression: Technically successful ultrasound guided injection.  Impression and Recommendations:    # Cervical radiculopathy - Patient's symptoms follow the distribution of the C8 nerve root, likely due to disk herniation. - Patient referred for formal physical therapy - XR neck 01/29/15 showed degenerative changes and loss of normal cervical lordosis. - Will obtain MRI of cervical spine  # Rotator Cuff Syndrome - Patient with evidence of impingement of supraspinatus and subacromial bursa - Ultrasound guided injection of kenalog/lidocaine into subacromial bursa performed today with immediate improvement in symptoms of impingement (see procedure note) - Patient may continue taking Percocet as prescribed - Begin meloxicam 15 mg daily  Follow up in 4 weeks or sooner as needed

## 2015-02-10 ENCOUNTER — Encounter: Payer: Self-pay | Admitting: Family Medicine

## 2015-02-17 ENCOUNTER — Other Ambulatory Visit: Payer: 59

## 2015-02-18 ENCOUNTER — Ambulatory Visit: Payer: 59 | Admitting: Physical Therapy

## 2015-02-20 ENCOUNTER — Ambulatory Visit (INDEPENDENT_AMBULATORY_CARE_PROVIDER_SITE_OTHER): Payer: 59

## 2015-02-20 ENCOUNTER — Telehealth: Payer: Self-pay

## 2015-02-20 ENCOUNTER — Encounter: Payer: Self-pay | Admitting: Family Medicine

## 2015-02-20 DIAGNOSIS — M4802 Spinal stenosis, cervical region: Secondary | ICD-10-CM

## 2015-02-20 DIAGNOSIS — M25512 Pain in left shoulder: Secondary | ICD-10-CM

## 2015-02-20 DIAGNOSIS — M542 Cervicalgia: Secondary | ICD-10-CM

## 2015-02-20 MED ORDER — OXYCODONE-ACETAMINOPHEN 10-325 MG PO TABS: 1.0000 | ORAL_TABLET | Freq: Three times a day (TID) | ORAL | Status: DC | PRN

## 2015-02-20 NOTE — Telephone Encounter (Signed)
Patient stated that he is still having neck and shoulder pain, he is requesting a Rx for Percocet. Rhonda Cunningham,CMA

## 2015-02-20 NOTE — Telephone Encounter (Signed)
Prescription is in box, we will discuss further at MRI follow-up. Further narcotic prescriptions may need to be through pain management if we cannot find a good interventional solution.

## 2015-02-21 NOTE — Telephone Encounter (Signed)
LEFT DETAILED MESSAGE ON PATIENT VM ADVISING THE RX  IS READY FOR PICKUP AND INSTRUCTIONS AS NOTED BELOW. Rhonda Cunningham,CMA

## 2015-02-26 ENCOUNTER — Ambulatory Visit: Payer: 59 | Admitting: Physical Therapy

## 2015-03-04 ENCOUNTER — Ambulatory Visit (INDEPENDENT_AMBULATORY_CARE_PROVIDER_SITE_OTHER): Payer: 59 | Admitting: Sports Medicine

## 2015-03-04 ENCOUNTER — Encounter: Payer: Self-pay | Admitting: Sports Medicine

## 2015-03-04 VITALS — BP 117/79 | HR 79 | Ht 76.0 in | Wt 277.0 lb

## 2015-03-04 DIAGNOSIS — M25512 Pain in left shoulder: Secondary | ICD-10-CM

## 2015-03-04 DIAGNOSIS — M5412 Radiculopathy, cervical region: Secondary | ICD-10-CM | POA: Diagnosis not present

## 2015-03-04 NOTE — Progress Notes (Signed)
  Subjective:    CC: follow-up  HPI: Left shoulder pain: Symptoms were referable both to cervical radiculopathy as well as the left subacromial bursa, Subacromial bursa injection resolved all left shoulder pain.  Left cervical radiculopathy: C8 and C7 distribution, cervical spine MRI obtained, the results of which will be dictated below. Pain is moderate, persistent.  Past medical history, Surgical history, Family history not pertinant except as noted below, Social history, Allergies, and medications have been entered into the medical record, reviewed, and no changes needed.   Review of Systems: No fevers, chills, night sweats, weight loss, chest pain, or shortness of breath.   Objective:    General: Well Developed, well nourished, and in no acute distress.  Neuro: Alert and oriented x3, extra-ocular muscles intact, sensation grossly intact.  HEENT: Normocephalic, atraumatic, pupils equal round reactive to light, neck supple, no masses, no lymphadenopathy, thyroid nonpalpable.  Skin: Warm and dry, no rashes. Cardiac: Regular rate and rhythm, no murmurs rubs or gallops, no lower extremity edema.  Respiratory: Clear to auscultation bilaterally. Not using accessory muscles, speaking in full sentences.  MRI shows C4-C5, C5-C6, and C6-C7 cervical degenerative disc disease, there is a foraminal component of the C6-C7 disc on the left side likely compressing the exiting left C7 nerve root.  Impression and Recommendations:

## 2015-03-04 NOTE — Assessment & Plan Note (Signed)
Shoulder is doing much better since subacromial injection. Continues to have some neck  Pain and paresthesias.

## 2015-03-04 NOTE — Assessment & Plan Note (Signed)
MRI shows multilevel protrusions, mild, but worse at the C6-C7 level with a left-sided foraminal disc protrusion.  we are going to proceed with a left-sided C6-C7 interlaminar epidural.

## 2015-03-13 ENCOUNTER — Ambulatory Visit
Admission: RE | Admit: 2015-03-13 | Discharge: 2015-03-13 | Disposition: A | Discharge status: Home / Self Care | Payer: 59 | Source: Ambulatory Visit | Attending: Sports Medicine | Admitting: Sports Medicine

## 2015-03-13 MED ORDER — IOHEXOL 300 MG/ML  SOLN
1.0000 mL | Freq: Once | INTRAMUSCULAR | Status: AC | PRN
  Administered 2015-03-13: 1 mL via EPIDURAL

## 2015-03-13 MED ORDER — TRIAMCINOLONE ACETONIDE 40 MG/ML IJ SUSP (RADIOLOGY)
60.0000 mg | Freq: Once | INTRAMUSCULAR | Status: AC
  Administered 2015-03-13: 60 mg via EPIDURAL

## 2015-03-13 NOTE — Discharge Instructions (Addendum)

## 2015-04-01 ENCOUNTER — Encounter: Payer: Self-pay | Admitting: Sports Medicine

## 2015-04-01 ENCOUNTER — Ambulatory Visit (INDEPENDENT_AMBULATORY_CARE_PROVIDER_SITE_OTHER): Payer: 59 | Admitting: Sports Medicine

## 2015-04-01 DIAGNOSIS — M5412 Radiculopathy, cervical region: Secondary | ICD-10-CM | POA: Diagnosis not present

## 2015-04-01 MED ORDER — HYDROCODONE-ACETAMINOPHEN 10-325 MG PO TABS: 1.0000 | ORAL_TABLET | Freq: Three times a day (TID) | ORAL | Status: DC | PRN

## 2015-04-01 NOTE — Progress Notes (Signed)
  Subjective:    CC: Follow-up  HPI: Left C7 cervical radiculopathy: Improved significantly with a significant improvement in pain and moderate improvement and paresthesias after left-sided cervical epidural. He is amenable to trying #2 of the series. He is having an increase in his low back pain after his recent honeymoon, he had to carry his wife's very heavy suitcase, overall it's improving.  Past medical history, Surgical history, Family history not pertinant except as noted below, Social history, Allergies, and medications have been entered into the medical record, reviewed, and no changes needed.   Review of Systems: No fevers, chills, night sweats, weight loss, chest pain, or shortness of breath.   Objective:    General: Well Developed, well nourished, and in no acute distress.  Neuro: Alert and oriented x3, extra-ocular muscles intact, sensation grossly intact.  HEENT: Normocephalic, atraumatic, pupils equal round reactive to light, neck supple, no masses, no lymphadenopathy, thyroid nonpalpable.  Skin: Warm and dry, no rashes. Cardiac: Regular rate and rhythm, no murmurs rubs or gallops, no lower extremity edema.  Respiratory: Clear to auscultation bilaterally. Not using accessory muscles, speaking in full sentences.  Impression and Recommendations:

## 2015-04-01 NOTE — Assessment & Plan Note (Signed)
Excellent response to a left-sided cervical epidural, good relief of pain and partial relief of paresthesias. We are going to proceed with epidural #2. Adding a bit of hydrocodone for pain. Return to see me in one month.

## 2015-04-04 ENCOUNTER — Other Ambulatory Visit: Payer: Self-pay | Admitting: Family Medicine

## 2015-04-15 ENCOUNTER — Ambulatory Visit: Payer: 59 | Admitting: Family Medicine

## 2015-04-17 ENCOUNTER — Other Ambulatory Visit: Payer: 59

## 2015-04-24 ENCOUNTER — Other Ambulatory Visit: Payer: 59

## 2015-04-24 ENCOUNTER — Ambulatory Visit: Payer: 59 | Admitting: Family Medicine

## 2015-04-25 ENCOUNTER — Telehealth: Payer: Self-pay | Admitting: *Deleted

## 2015-04-25 ENCOUNTER — Other Ambulatory Visit: Payer: Self-pay | Admitting: *Deleted

## 2015-04-25 MED ORDER — HYDROCODONE-ACETAMINOPHEN 5-325 MG PO TABS: 1.0000 | ORAL_TABLET | Freq: Three times a day (TID) | ORAL | Status: DC | PRN

## 2015-04-25 NOTE — Telephone Encounter (Signed)
I'm going to start to down taper his hydrocodone.we are going to drop it to 5/325, I will give him the same number.

## 2015-04-25 NOTE — Telephone Encounter (Signed)
Pt called asking for a refill on his hydrocodone. Are you approving of this?

## 2015-04-28 NOTE — Telephone Encounter (Signed)
[facility] notifying pt of new rx strength.

## 2015-05-02 ENCOUNTER — Other Ambulatory Visit: Payer: 59

## 2015-05-09 ENCOUNTER — Other Ambulatory Visit: Payer: Self-pay | Admitting: Family Medicine

## 2015-05-09 NOTE — Telephone Encounter (Signed)
Amber, Rx in your in box 

## 2015-06-03 ENCOUNTER — Ambulatory Visit: Payer: 59 | Admitting: Family Medicine

## 2015-06-10 ENCOUNTER — Ambulatory Visit (INDEPENDENT_AMBULATORY_CARE_PROVIDER_SITE_OTHER): Payer: 59 | Admitting: Sports Medicine

## 2015-06-10 ENCOUNTER — Ambulatory Visit: Payer: 59 | Admitting: Family Medicine

## 2015-06-10 ENCOUNTER — Encounter: Payer: Self-pay | Admitting: Sports Medicine

## 2015-06-10 DIAGNOSIS — M160 Bilateral primary osteoarthritis of hip: Secondary | ICD-10-CM | POA: Diagnosis not present

## 2015-06-10 NOTE — Assessment & Plan Note (Signed)
Left hip joint injection as above, right hip was injected in December 2015.

## 2015-06-10 NOTE — Progress Notes (Signed)
  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 Pain is moderate, persistent with radiation to the anterior thigh. No trauma. No changes in activity.  Past medical history, Surgical history, Family history not pertinant except as noted below, Social history, Allergies, and medications have been entered into the medical record, reviewed, and no changes needed.   Review of Systems: No fevers, chills, night sweats, weight loss, chest pain, or shortness of breath.   Objective:    General: Well Developed, well nourished, and in no acute distress.  Neuro: Alert and oriented x3, extra-ocular muscles intact, sensation grossly intact.  HEENT: Normocephalic, atraumatic, pupils equal round reactive to light, neck supple, no masses, no lymphadenopathy, thyroid nonpalpable.  Skin: Warm and dry, no rashes. Cardiac: Regular rate and rhythm, no murmurs rubs or gallops, no lower extremity edema.  Respiratory: Clear to auscultation bilaterally. Not using accessory muscles, speaking in full sentences. Left Hip: ROM IR: 60 Deg, ER: 60 Deg, Flexion: 120 Deg, Extension: 100 Deg, Abduction: 45 Deg, Adduction: 45 Deg, internal rotation reproduces pain Strength IR: 5/5, ER: 5/5, Flexion: 5/5, Extension: 5/5, Abduction: 5/5, Adduction: 5/5 Pelvic alignment unremarkable to inspection and palpation. Standing hip rotation and gait without trendelenburg / unsteadiness. Greater trochanter without tenderness to palpation. No tenderness over piriformis. No SI joint tenderness and normal minimal SI movement.  Procedure: Real-time Ultrasound Guided Injection of left femoral acetabular joint Device: GE Logiq E  Verbal informed consent obtained.  Time-out conducted.  Noted no overlying erythema,  induration, or other signs of local infection.  Skin prepped in a sterile fashion.  Local anesthesia: Topical Ethyl chloride.  With sterile technique and under real time ultrasound guidance:  25-gauge needle advanced into the femoral head/neck junction, the femoral nerve was visible traversing my trajectory so we had to change direction, there was a visible effusion. 2 mL kenalog 40, 4 mL lidocaine injected easily. Completed without difficulty   Pain immediately resolved suggesting accurate placement of the medication.  Advised to call if fevers/chills, erythema, induration, drainage, or persistent bleeding.  Images permanently stored and available for review in the ultrasound unit.  Impression: Technically successful ultrasound guided injection.  Impression and Recommendations:

## 2015-06-27 ENCOUNTER — Telehealth: Payer: Self-pay

## 2015-06-27 DIAGNOSIS — M160 Bilateral primary osteoarthritis of hip: Secondary | ICD-10-CM

## 2015-06-27 NOTE — Telephone Encounter (Signed)
PATIENT CALLED STATED THAT HE IS HAVING EXTREME PAIN FROM HIS HIP INJECTION. PATIENT REQUEST SOMETHING STRONG FOR PAIN. PLEASE ADVISE PATIENT. Rhonda Cunningham,CMA

## 2015-06-27 NOTE — Telephone Encounter (Signed)
With bilateral hip osteoarthritis and persistent pain after injection at this point it is time to consider discussing this with surgery. Referral placed

## 2015-06-27 NOTE — Telephone Encounter (Signed)
Left message advising or recommendations.  

## 2015-07-08 ENCOUNTER — Ambulatory Visit: Payer: 59 | Admitting: Sports Medicine

## 2015-07-30 ENCOUNTER — Other Ambulatory Visit: Payer: Self-pay | Admitting: Family Medicine

## 2015-07-31 ENCOUNTER — Other Ambulatory Visit: Payer: Self-pay | Admitting: Family Medicine

## 2015-11-07 ENCOUNTER — Other Ambulatory Visit: Payer: Self-pay | Admitting: Family Medicine

## 2015-11-12 ENCOUNTER — Other Ambulatory Visit: Payer: Self-pay | Admitting: Family Medicine

## 2015-11-19 ENCOUNTER — Other Ambulatory Visit: Payer: Self-pay

## 2015-11-19 ENCOUNTER — Other Ambulatory Visit: Payer: Self-pay | Admitting: Family Medicine

## 2015-11-19 MED ORDER — ARMODAFINIL 150 MG PO TABS: 150.0000 mg | ORAL_TABLET | Freq: Every day | ORAL | Status: DC

## 2016-03-01 ENCOUNTER — Ambulatory Visit (INDEPENDENT_AMBULATORY_CARE_PROVIDER_SITE_OTHER): Payer: 59 | Admitting: Family Medicine

## 2016-03-01 ENCOUNTER — Encounter: Payer: Self-pay | Admitting: Family Medicine

## 2016-03-01 VITALS — BP 132/89 | HR 76 | Wt 300.0 lb

## 2016-03-01 DIAGNOSIS — G4726 Circadian rhythm sleep disorder, shift work type: Secondary | ICD-10-CM

## 2016-03-01 DIAGNOSIS — E785 Hyperlipidemia, unspecified: Secondary | ICD-10-CM

## 2016-03-01 MED ORDER — ARMODAFINIL 150 MG PO TABS: 150.0000 mg | ORAL_TABLET | Freq: Every day | ORAL | Status: DC

## 2016-03-01 NOTE — Progress Notes (Signed)
CC: Bryan Harvey is a 50 y.o. male is here for Medication Refill and Edema   Subjective: HPI:  Sleep disorder: He is taking Nuvigil on a daily basis without known side effects. He tells me it still provide some benefit with respect to fatigue during his work hours. 1- 2 weeks out of the month he has to work the night shift and this resolved his circadian rhythms. He denies any known side effects. He denies irregular heartbeat, chest pain or anxiety.  Follow hyperlipidemia: He was unable to tolerate Lipitor, caused rhabdomyolysis. He wants know if he should be on any cholesterol medicine. It has been greater than 1 year since his cholesterol was checked last.   Review Of Systems Outlined In HPI  Past Medical History  Diagnosis Date  . Back pain   . GERD (gastroesophageal reflux disease)   . Hyperlipidemia   . HYPERLIPIDEMIA 01/19/2008    Qualifier: Diagnosis of  By: Thomos LemonsBowen DO, Karen    . GASTROESOPHAGEAL REFLUX, NO ESOPHAGITIS 08/30/2006    Qualifier: Diagnosis of  By: Thomos LemonsBowen DO, Karen    . HERNIA, HIATAL, NONCONGENITAL 08/30/2006    Qualifier: Diagnosis of  By: Thomos LemonsBowen DO, Karen    . DISC DISEASE, LUMBAR 12/28/2007    Qualifier: Diagnosis of  By: Thomos LemonsBowen DO, Karen    . BACK PAIN WITH RADICULOPATHY 08/19/2008    Qualifier: Diagnosis of  By: Thomos LemonsBowen DO, Karen    . PONV (postoperative nausea and vomiting)     Past Surgical History  Procedure Laterality Date  . Back surgery    . Knee cartilage surgery Left 1994  . Ankle arthroscopy Right 1998  . Tonsillectomy     Family History  Problem Relation Age of Onset  . Lymphoma Father   . Diabetes Mother     Social History   Social History  . Marital Status: Married    Spouse Name: N/A  . Number of Children: N/A  . Years of Education: N/A   Occupational History  . Not on file.   Social History Main Topics  . Smoking status: Never Smoker   . Smokeless tobacco: Former NeurosurgeonUser    Types: Snuff    Quit date: 10/26/1999  . Alcohol  Use: Yes     Comment: social  . Drug Use: No  . Sexual Activity: Yes   Other Topics Concern  . Not on file   Social History Narrative     Objective: BP 132/89 mmHg  Pulse 76  Wt 300 lb (136.079 kg)  General: Alert and Oriented, No Acute Distress HEENT: Pupils equal, round, reactive to light. Conjunctivae clear.  Moist mucous membranes Lungs: Clear to auscultation bilaterally, no wheezing/ronchi/rales.  Comfortable work of breathing. Good air movement. Cardiac: Regular rate and rhythm. Normal S1/S2.  No murmurs, rubs, nor gallops.   Extremities: No peripheral edema.  Strong peripheral pulses.  Mental Status: No depression, anxiety, nor agitation. Skin: Warm and dry.  Assessment & Plan: [name] was seen today for medication refill and edema.  Diagnoses and all orders for this visit:  Shift work sleep disorder  Hyperlipidemia -     Lipid panel  Other orders -     Armodafinil 150 MG tablet; Take 1 tablet (150 mg total) by mouth daily.   Nuvigil is still helping with shift work sleep disorder. Hyperlipidemia: Due for repeat lipid panel to help calculate 10 year risk of cardiovascular event and to determine if he needs to be on a statin or other cholesterol  medication.  Return in about 6 months (around 08/31/2016).

## 2016-03-09 ENCOUNTER — Ambulatory Visit (INDEPENDENT_AMBULATORY_CARE_PROVIDER_SITE_OTHER): Payer: 59 | Admitting: Family Medicine

## 2016-03-09 ENCOUNTER — Other Ambulatory Visit: Payer: Self-pay

## 2016-03-09 ENCOUNTER — Encounter: Payer: Self-pay | Admitting: Family Medicine

## 2016-03-09 VITALS — BP 130/85 | HR 85 | Wt 306.0 lb

## 2016-03-09 DIAGNOSIS — S3992XA Unspecified injury of lower back, initial encounter: Secondary | ICD-10-CM | POA: Diagnosis not present

## 2016-03-09 MED ORDER — ARMODAFINIL 150 MG PO TABS: 150.0000 mg | ORAL_TABLET | Freq: Every day | ORAL | Status: DC

## 2016-03-09 MED ORDER — DIAZEPAM 5 MG PO TABS: 5.0000 mg | ORAL_TABLET | Freq: Three times a day (TID) | ORAL | Status: DC | PRN

## 2016-03-09 MED ORDER — PREDNISONE 20 MG PO TABS: ORAL_TABLET | ORAL | Status: AC

## 2016-03-09 NOTE — Progress Notes (Signed)
CC: Bryan Harvey is a 50 y.o. male is here for Back Pain   Subjective: HPI:   left low back pain present for the last 36 hours. Slowly worsened over the first 12 hours now persistent. Localized in the left lower back away from midline. It radiates around the side of the left hip and into the left groin. He denies any testicular or penile pain. He denies any numbness or weakness in the lower extremities. Pain is worse with extension improved with flexion. No benefit from meloxicam, naproxen or Tylenol. He tells me the pain feels like a cramp and he feels his back muscles and left thigh muscles tense up if he starts to sit erect. Denies any gastrointestinal complaints or genitourinary complaints.symptoms began a few hours after he began a new HIT exercise plan   Review Of Systems Outlined In HPI  Past Medical History  Diagnosis Date  . Back pain   . GERD (gastroesophageal reflux disease)   . Hyperlipidemia   . HYPERLIPIDEMIA 01/19/2008    Qualifier: Diagnosis of  By: Thomos LemonsBowen DO, Karen    . GASTROESOPHAGEAL REFLUX, NO ESOPHAGITIS 08/30/2006    Qualifier: Diagnosis of  By: Thomos LemonsBowen DO, Karen    . HERNIA, HIATAL, NONCONGENITAL 08/30/2006    Qualifier: Diagnosis of  By: Thomos LemonsBowen DO, Karen    . DISC DISEASE, LUMBAR 12/28/2007    Qualifier: Diagnosis of  By: Thomos LemonsBowen DO, Karen    . BACK PAIN WITH RADICULOPATHY 08/19/2008    Qualifier: Diagnosis of  By: Thomos LemonsBowen DO, Karen    . PONV (postoperative nausea and vomiting)     Past Surgical History  Procedure Laterality Date  . Back surgery    . Knee cartilage surgery Left 1994  . Ankle arthroscopy Right 1998  . Tonsillectomy     Family History  Problem Relation Age of Onset  . Lymphoma Father   . Diabetes Mother     Social History   Social History  . Marital Status: Married    Spouse Name: N/A  . Number of Children: N/A  . Years of Education: N/A   Occupational History  . Not on file.   Social History Main Topics  . Smoking status: Never  Smoker   . Smokeless tobacco: Former NeurosurgeonUser    Types: Snuff    Quit date: 10/26/1999  . Alcohol Use: Yes     Comment: social  . Drug Use: No  . Sexual Activity: Yes   Other Topics Concern  . Not on file   Social History Narrative     Objective: BP 130/85 mmHg  Pulse 85  Wt 306 lb (138.801 kg)  Vital signs reviewed. General: Alert and Oriented, No Acute Distress HEENT: Pupils equal, round, reactive to light. Conjunctivae clear.  External ears unremarkable.  Moist mucous membranes. Lungs: Clear and comfortable work of breathing, speaking in full sentences without accessory muscle use. Cardiac: Regular rate and rhythm.  Neuro: CN II-XII grossly intact, gait normal. Back: No midline spinous process tenderness in the lumbar spine. Pain is reproduced with palpation of the muscles just above the left pelvic brim which are moderately hypertonic. Extremities: No peripheral edema.  Strong peripheral pulses.full range of motion and strength in both lower extremities  Skin: Warm and dry.  Assessment & Plan: Bryan Harvey was seen today for back pain.  Diagnoses and all orders for this visit:  Lower back injury, initial encounter -     predniSONE (DELTASONE) 20 MG tablet; Three tabs daily days 1-3, two  tabs daily days 4-6, one tab daily days 7-9, half tab daily days 10-13. -     diazepam (VALIUM) 5 MG tablet; Take 1-2 tablets (5-10 mg total) by mouth every 8 (eight) hours as needed for muscle spasms.   Lumbar radiculopathy versus muscular strain, start Valium for help with the spasm component of his pain and prednisone since nonsteroidal anti-inflammatories are not helping with pain. I encouraged him to follow with one of our sports medicine doctors for second opinion on Thursday if he's not feeling better.   Return in about 2 days (around 03/11/2016).

## 2016-03-11 ENCOUNTER — Telehealth: Payer: Self-pay | Admitting: Family Medicine

## 2016-03-11 MED ORDER — TRIAMCINOLONE ACETONIDE 0.1 % EX CREA: TOPICAL_CREAM | CUTANEOUS | Status: DC

## 2016-03-11 NOTE — Telephone Encounter (Signed)
Rx request 

## 2016-03-29 ENCOUNTER — Emergency Department
Admission: EM | Admit: 2016-03-29 | Discharge: 2016-03-29 | Disposition: A | Discharge status: Home / Self Care | Payer: 59 | Attending: Emergency Medicine | Admitting: Emergency Medicine

## 2016-03-29 ENCOUNTER — Encounter: Payer: Self-pay | Admitting: Emergency Medicine

## 2016-03-29 ENCOUNTER — Emergency Department: Payer: 59

## 2016-03-29 DIAGNOSIS — E785 Hyperlipidemia, unspecified: Secondary | ICD-10-CM | POA: Diagnosis not present

## 2016-03-29 DIAGNOSIS — M5416 Radiculopathy, lumbar region: Secondary | ICD-10-CM | POA: Insufficient documentation

## 2016-03-29 DIAGNOSIS — M5410 Radiculopathy, site unspecified: Secondary | ICD-10-CM

## 2016-03-29 MED ORDER — METHOCARBAMOL 500 MG PO TABS
1000.0000 mg | ORAL_TABLET | Freq: Once | ORAL | Status: AC
  Administered 2016-03-29: 1000 mg via ORAL
  Filled 2016-03-29: qty 2

## 2016-03-29 MED ORDER — OXYCODONE-ACETAMINOPHEN 7.5-325 MG PO TABS: 1.0000 | ORAL_TABLET | ORAL | Status: DC | PRN

## 2016-03-29 MED ORDER — HYDROMORPHONE HCL 1 MG/ML IJ SOLN
1.0000 mg | Freq: Once | INTRAMUSCULAR | Status: AC
  Administered 2016-03-29: 1 mg via INTRAMUSCULAR
  Filled 2016-03-29: qty 1

## 2016-03-29 MED ORDER — METHOCARBAMOL 750 MG PO TABS: 1500.0000 mg | ORAL_TABLET | Freq: Four times a day (QID) | ORAL | Status: DC

## 2016-03-29 MED ORDER — METHYLPREDNISOLONE SODIUM SUCC 125 MG IJ SOLR
125.0000 mg | Freq: Once | INTRAMUSCULAR | Status: AC
  Administered 2016-03-29: 125 mg via INTRAMUSCULAR

## 2016-03-29 MED ORDER — METHYLPREDNISOLONE SODIUM SUCC 125 MG IJ SOLR
125.0000 mg | Freq: Once | INTRAMUSCULAR | Status: DC
  Filled 2016-03-29: qty 2

## 2016-03-29 NOTE — ED Notes (Signed)
See triage note   Hx of back problems but pain increased after falling   Ambulates with limp to treatment room  Denies any urinary sx's

## 2016-03-29 NOTE — ED Notes (Signed)
Pt presents with back pain x 3 weeks. He was given prednisone and muscle relaxers for it, but it got worse yesterday after a fall.

## 2016-03-29 NOTE — ED Provider Notes (Signed)
[facility] Emergency Department Provider Note   ____________________________________________  Time seen: Approximately 7:38 AM  I have reviewed the triage vital signs and the nursing notes.   HISTORY  Chief Complaint Back Pain    HPI Bryan Harvey is a 50 y.o. male patient complaining of left radicular back pain. Patient has a history of back pain for 3 weeks. Patient was seen 2 weeks ago for same complaint and was given prednisone and muscle relaxants. Patient say gradually improving reinjured his back yesterday. Patient stated as a hyperflexion-incident of his left leg as he was trying to step up on a block. Patient denies any bladder or bowel dysfunction. Patient mental history is positive for tooth spinal fusions greater than 5 years ago. Patient is rating his pain as a 7/10. No palliative measures taken for this incident which occurred yesterday.   Past Medical History  Diagnosis Date  . Back pain   . GERD (gastroesophageal reflux disease)   . Hyperlipidemia   . HYPERLIPIDEMIA 01/19/2008    Qualifier: Diagnosis of  By: Thomos LemonsBowen DO, Karen    . GASTROESOPHAGEAL REFLUX, NO ESOPHAGITIS 08/30/2006    Qualifier: Diagnosis of  By: Thomos LemonsBowen DO, Karen    . HERNIA, HIATAL, NONCONGENITAL 08/30/2006    Qualifier: Diagnosis of  By: Thomos LemonsBowen DO, Karen    . DISC DISEASE, LUMBAR 12/28/2007    Qualifier: Diagnosis of  By: Thomos LemonsBowen DO, Karen    . BACK PAIN WITH RADICULOPATHY 08/19/2008    Qualifier: Diagnosis of  By: Thomos LemonsBowen DO, Karen    . PONV (postoperative nausea and vomiting)     Patient Active Problem List   Diagnosis Date Noted  . Left cervical radiculopathy 03/04/2015  . Left shoulder pain 02/04/2015  . Superficial thrombophlebitis of left leg 11/04/2014  . Osteoarthritis, hip, bilateral 10/07/2014  . Rhabdomyolysis due to statin therapy 08/07/2014  . Colon cancer screening 03/19/2014  . Family history of colon cancer 03/19/2014  . Shift work sleep disorder  01/24/2013  . Eczema 01/24/2013  . Hyperlipidemia 01/19/2008  . Lumbar degenerative disc disease 12/28/2007  . OBESITY, NOS 08/30/2006  . GASTROESOPHAGEAL REFLUX, NO ESOPHAGITIS 08/30/2006  . HERNIA, HIATAL, NONCONGENITAL 08/30/2006    Past Surgical History  Procedure Laterality Date  . Back surgery    . Knee cartilage surgery Left 1994  . Ankle arthroscopy Right 1998  . Tonsillectomy      Current Outpatient Rx  Name  Route  Sig  Dispense  Refill  . Armodafinil 150 MG tablet   Oral   Take 1 tablet (150 mg total) by mouth daily.   90 tablet   0     Not to exceed 4 additional fills before 01/27/2016   . diazepam (VALIUM) 5 MG tablet   Oral   Take 1-2 tablets (5-10 mg total) by mouth every 8 (eight) hours as needed for muscle spasms.   30 tablet   0   . Ibuprofen (ADVIL PO)   Oral   Take by mouth.         . methocarbamol (ROBAXIN-750) 750 MG tablet   Oral   Take 2 tablets (1,500 mg total) by mouth 4 (four) times daily.   40 tablet   0   . oxyCODONE-acetaminophen (PERCOCET) 7.5-325 MG tablet   Oral   Take 1 tablet by mouth every 4 (four) hours as needed for severe pain.   20 tablet   0   . triamcinolone cream (KENALOG) 0.1 %  Apply to affected areas twice a day for up to two weeks, avoid face.   80 g   2     Allergies Review of patient's allergies indicates no known allergies.  Family History  Problem Relation Age of Onset  . Lymphoma Father   . Diabetes Mother     Social History Social History  Substance Use Topics  . Smoking status: Never Smoker   . Smokeless tobacco: Former Neurosurgeon    Types: Snuff    Quit date: 10/26/1999  . Alcohol Use: Yes     Comment: social    Review of Systems Constitutional: No fever/chills Eyes: No visual changes. ENT: No sore throat. Cardiovascular: Denies chest pain. Respiratory: Denies shortness of breath. Gastrointestinal: No abdominal pain.  No nausea, no vomiting.  No diarrhea.  No  constipation. Genitourinary: Negative for dysuria. Musculoskeletal: Positive for back pain. Skin: Negative for rash. Neurological: Negative for headaches, focal weakness or numbness. Endocrine:Hyperlipidemia      ____________________________________________   PHYSICAL EXAM:  VITAL SIGNS: ED Triage Vitals  Enc Vitals Group     BP 03/29/16 0721 141/76 mmHg     Pulse Rate 03/29/16 0721 88     Resp 03/29/16 0721 20     Temp 03/29/16 0721 97.8 F (36.6 C)     Temp Source 03/29/16 0721 Oral     SpO2 03/29/16 0721 98 %     Weight 03/29/16 0721 290 lb (131.543 kg)     Height 03/29/16 0721  (1.93 m)     Head Cir --      Peak Flow --      Pain Score 03/29/16 0722 7     Pain Loc --      Pain Edu? --      Excl. in GC? --     Constitutional: Alert and oriented. Moderate distress Eyes: Conjunctivae are normal. PERRL. EOMI. Head: Atraumatic. Nose: No congestion/rhinnorhea. Mouth/Throat: Mucous membranes are moist.  Oropharynx non-erythematous. Neck: No stridor.  No cervical spine tenderness to palpation. Hematological/Lymphatic/Immunilogical: No cervical lymphadenopathy. Cardiovascular: Normal rate, regular rhythm. Grossly normal heart sounds.  Good peripheral circulation. Respiratory: Normal respiratory effort.  No retractions. Lungs CTAB. Gastrointestinal: Soft and nontender. No distention. No abdominal bruits. No CVA tenderness. Musculoskeletal: Patient states affects position even while sitting. States since then heart rate last plan upper extremities. No obvious spinal deformity. Patient is moderate guarding palpation of L3-S1. Patient decreased range of motion with extension. Patient has a negative straight leg test. Patient walks and hesitant gait.  Neurologic:  Normal speech and language. No gross focal neurologic deficits are appreciated. No gait instability. Skin:  Skin is warm, dry and intact. No rash noted. Surgical scars consistent with previous  surgeries. Psychiatric: Mood and affect are normal. Speech and behavior are normal.  ____________________________________________   LABS (all labs ordered are listed, but only abnormal results are displayed)  Labs Reviewed - No data to display ____________________________________________  EKG   ____________________________________________  RADIOLOGY  No acute findings on lumbar spine. Stable degenerative changes. ____________________________________________   PROCEDURES  Procedure(s) performed: None  Critical Care performed: No  ____________________________________________   INITIAL IMPRESSION / ASSESSMENT AND PLAN / ED COURSE  Pertinent labs & imaging results that were available during my care of the patient were reviewed by me and considered in my medical decision making (see chart for details).  Radicular back pain. Discussed x-ray finding with patient. Patient given a prescription for Percocets and Robaxin. Patient advised follow-up family doctor if  no improvement 3 days. ____________________________________________   FINAL CLINICAL IMPRESSION(S) / ED DIAGNOSES  Final diagnoses:  Radicular low back pain      NEW MEDICATIONS STARTED DURING THIS VISIT:  New Prescriptions   METHOCARBAMOL (ROBAXIN-750) 750 MG TABLET    Take 2 tablets (1,500 mg total) by mouth 4 (four) times daily.   OXYCODONE-ACETAMINOPHEN (PERCOCET) 7.5-325 MG TABLET    Take 1 tablet by mouth every 4 (four) hours as needed for severe pain.     Note:  This document was prepared using Dragon voice recognition software and may include unintentional dictation errors.    Joni Reining, PA-C 03/29/16 0832  Joni Reining, PA-C 03/29/16 1610  Governor Rooks, MD 03/29/16 [phone]

## 2016-03-29 NOTE — Discharge Instructions (Signed)
Radicular Pain °Radicular pain in either the arm or leg is usually from a bulging or herniated disk in the spine. A piece of the herniated disk may press against the nerves as the nerves exit the spine. This causes pain which is felt at the tips of the nerves down the arm or leg. Other causes of radicular pain may include: °· Fractures. °· Heart disease. °· Cancer. °· An abnormal and usually degenerative state of the nervous system or nerves (neuropathy). °Diagnosis may require CT or MRI scanning to determine the primary cause.  °Nerves that start at the neck (nerve roots) may cause radicular pain in the outer shoulder and arm. It can spread down to the thumb and fingers. The symptoms vary depending on which nerve root has been affected. In most cases radicular pain improves with conservative treatment. Neck problems may require physical therapy, a neck collar, or cervical traction. Treatment may take many weeks, and surgery may be considered if the symptoms do not improve.  °Conservative treatment is also recommended for sciatica. Sciatica causes pain to radiate from the lower back or buttock area down the leg into the foot. Often there is a history of back problems. Most patients with sciatica are better after 2 to 4 weeks of rest and other supportive care. Short term bed rest can reduce the disk pressure considerably. Sitting, however, is not a good position since this increases the pressure on the disk. You should avoid bending, lifting, and all other activities which make the problem worse. Traction can be used in severe cases. Surgery is usually reserved for patients who do not improve within the first months of treatment. °Only take over-the-counter or prescription medicines for pain, discomfort, or fever as directed by your caregiver. Narcotics and muscle relaxants may help by relieving more severe pain and spasm and by providing mild sedation. Cold or massage can give significant relief. Spinal manipulation  is not recommended. It can increase the degree of disc protrusion. Epidural steroid injections are often effective treatment for radicular pain. These injections deliver medicine to the spinal nerve in the space between the protective covering of the spinal cord and back bones (vertebrae). Your caregiver can give you more information about steroid injections. These injections are most effective when given within two weeks of the onset of pain.  °You should see your caregiver for follow up care as recommended. A program for neck and back injury rehabilitation with stretching and strengthening exercises is an important part of management.  °SEEK IMMEDIATE MEDICAL CARE IF: °· You develop increased pain, weakness, or numbness in your arm or leg. °· You develop difficulty with bladder or bowel control. °· You develop abdominal pain. °  °This information is not intended to replace advice given to you by your health care provider. Make sure you discuss any questions you have with your health care provider. °  °Document Released: 12/16/2004 Document Revised: 11/29/2014 Document Reviewed: 06/04/2015 °Elsevier Interactive Patient Education ©2016 [employer] ° °

## 2016-04-02 ENCOUNTER — Telehealth: Payer: Self-pay | Admitting: Family Medicine

## 2016-04-02 MED ORDER — MODAFINIL 200 MG PO TABS
200.0000 mg | ORAL_TABLET | Freq: Every day | ORAL | Status: DC
Start: 2016-04-02 — End: 2016-04-13

## 2016-04-02 NOTE — Telephone Encounter (Signed)
Med change due to insurance coverage

## 2016-04-05 ENCOUNTER — Telehealth: Payer: Self-pay | Admitting: *Deleted

## 2016-04-05 NOTE — Telephone Encounter (Signed)
PA faxed through [employer]

## 2016-04-12 ENCOUNTER — Ambulatory Visit (INDEPENDENT_AMBULATORY_CARE_PROVIDER_SITE_OTHER): Payer: 59 | Admitting: Sports Medicine

## 2016-04-12 ENCOUNTER — Encounter: Payer: Self-pay | Admitting: Sports Medicine

## 2016-04-12 DIAGNOSIS — M5136 Other intervertebral disc degeneration, lumbar region: Secondary | ICD-10-CM

## 2016-04-12 DIAGNOSIS — M51369 Other intervertebral disc degeneration, lumbar region without mention of lumbar back pain or lower extremity pain: Secondary | ICD-10-CM

## 2016-04-12 MED ORDER — METHYLPREDNISOLONE SODIUM SUCC 125 MG IJ SOLR
125.0000 mg | Freq: Once | INTRAMUSCULAR | Status: AC
  Administered 2016-04-12: 125 mg via INTRAMUSCULAR

## 2016-04-12 MED ORDER — KETOROLAC TROMETHAMINE 30 MG/ML IJ SOLN
30.0000 mg | Freq: Once | INTRAMUSCULAR | Status: AC
  Administered 2016-04-12: 30 mg via INTRAMUSCULAR

## 2016-04-12 MED ORDER — METHYLPREDNISOLONE ACETATE 80 MG/ML IJ SUSP
80.0000 mg | Freq: Once | INTRAMUSCULAR | Status: AC
  Administered 2016-04-12: 80 mg via INTRAMUSCULAR

## 2016-04-12 NOTE — Addendum Note (Signed)
Addended by: Baird KayUGLAS, Genetta Fiero M on: 04/12/2016 12:23 PM   Modules accepted: Orders

## 2016-04-12 NOTE — Patient Instructions (Signed)
Solu-Medrol 250, Toradol 30, Depo-Medrol 80

## 2016-04-12 NOTE — Assessment & Plan Note (Signed)
Has initially been doing well after L4-S1 fusions. 2 years ago he had a recurrence of pain which responded well to Solu-Medrol 250, Toradol 30, Depo-Medrol 80, Flexeril. We are going to do this again today, it's been a month, he's had x-rays, so we do need to proceed with MRI for epidural. Checking BMP

## 2016-04-12 NOTE — Progress Notes (Signed)
  Subjective:    CC: Back pain  HPI: This is a pleasant 50 year old male who is post L4-5 and L5-S1 fusions, occasionally he will injure his back and need parenteral steroids. This happened 2 years ago, he is now having recurrence of pain that he localizes in his back on the left side with radiation to the left anterior thigh and groin. Symptoms are severe, persistent. He is agreeable to proceed conservatively at first.  He is also post left hip arthroscopy with labral debridement.  Past medical history, Surgical history, Family history not pertinant except as noted below, Social history, Allergies, and medications have been entered into the medical record, reviewed, and no changes needed.   Review of Systems: No fevers, chills, night sweats, weight loss, chest pain, or shortness of breath.   Objective:    General: Well Developed, well nourished, and in no acute distress.  Neuro: Alert and oriented x3, extra-ocular muscles intact, sensation grossly intact.  HEENT: Normocephalic, atraumatic, pupils equal round reactive to light, neck supple, no masses, no lymphadenopathy, thyroid nonpalpable.  Skin: Warm and dry, no rashes. Cardiac: Regular rate and rhythm, no murmurs rubs or gallops, no lower extremity edema.  Respiratory: Clear to auscultation bilaterally. Not using accessory muscles, speaking in full sentences. Back Exam:  Inspection: Unremarkable  Motion: Flexion 45 deg, Extension 45 deg, Side Bending to 45 deg bilaterally,  Rotation to 45 deg bilaterally  SLR laying: Positive on the left  XSLR laying: Negative  Palpable tenderness: None. FABER: negative. Sensory change: Gross sensation intact to all lumbar and sacral dermatomes.  Reflexes: 2+ at both patellar tendons, 2+ at achilles tendons, Babinski's downgoing.  Strength at foot  Plantar-flexion: 5/5 Dorsi-flexion: 5/5 Eversion: 5/5 Inversion: 5/5  Leg strength  Quad: 5/5 Hamstring: 5/5 Hip flexor: 5/5 Hip abductors: 5/5    Gait unremarkable. Left Hip: ROM IR: 60 Deg, ER: 60 Deg, Flexion: 120 Deg, Extension: 100 Deg, Abduction: 45 Deg, Adduction: 45 Deg Strength IR: 5/5, ER: 5/5, Flexion: 5/5, Extension: 5/5, Abduction: 5/5, Adduction: 5/5 Pelvic alignment unremarkable to inspection and palpation. Standing hip rotation and gait without trendelenburg / unsteadiness. Greater trochanter without tenderness to palpation. No tenderness over piriformis. No SI joint tenderness and normal minimal SI movement.  Impression and Recommendations:    I spent 25 minutes with this patient, greater than 50% was face-to-face time counseling regarding the above diagnoses

## 2016-04-13 ENCOUNTER — Telehealth: Payer: Self-pay | Admitting: Family Medicine

## 2016-04-13 MED ORDER — MODAFINIL 200 MG PO TABS: 200.0000 mg | ORAL_TABLET | Freq: Every day | ORAL | Status: DC

## 2016-04-13 NOTE — Telephone Encounter (Signed)
Left detailed message on patient vm informing of this information. Laine Fonner,CMA

## 2016-04-13 NOTE — Addendum Note (Signed)
Addended by: Laren BoomHOMMEL, Elysa Womac on: 04/13/2016 02:31 PM   Modules accepted: Orders

## 2016-04-13 NOTE — Telephone Encounter (Signed)
Will you please let patient know that his insurance is no longer covering Armodafinil nor the only comparable substitution provigil.

## 2016-04-15 LAB — BASIC METABOLIC PANEL
BUN: 27 mg/dL — ABNORMAL HIGH (ref 7–25)
CO2: 23 mmol/L (ref 20–31)
Calcium: 8.6 mg/dL (ref 8.6–10.3)
Glucose, Bld: 93 mg/dL (ref 65–99)
Sodium: 139 mmol/L (ref 135–146)

## 2016-04-15 LAB — BASIC METABOLIC PANEL WITH GFR
Chloride: 107 mmol/L (ref 98–110)
Creat: 1.1 mg/dL (ref 0.60–1.35)
Potassium: 4.2 mmol/L (ref 3.5–5.3)

## 2016-04-20 ENCOUNTER — Other Ambulatory Visit: Payer: 59

## 2016-04-21 NOTE — Telephone Encounter (Signed)
This has been approved  Until 04/14/2017. Left message on pt's vm

## 2016-04-26 ENCOUNTER — Other Ambulatory Visit: Payer: 59

## 2016-07-15 ENCOUNTER — Other Ambulatory Visit: Payer: Self-pay

## 2016-07-15 MED ORDER — MODAFINIL 200 MG PO TABS: 200.0000 mg | ORAL_TABLET | Freq: Every day | ORAL | 0 refills | Status: DC

## 2016-07-22 ENCOUNTER — Emergency Department
Admission: EM | Admit: 2016-07-22 | Discharge: 2016-07-22 | Disposition: A | Discharge status: Home / Self Care | Payer: 59 | Attending: Emergency Medicine | Admitting: Emergency Medicine

## 2016-07-22 ENCOUNTER — Encounter: Payer: Self-pay | Admitting: Emergency Medicine

## 2016-07-22 DIAGNOSIS — Z87891 Personal history of nicotine dependence: Secondary | ICD-10-CM | POA: Diagnosis not present

## 2016-07-22 DIAGNOSIS — M5441 Lumbago with sciatica, right side: Secondary | ICD-10-CM | POA: Insufficient documentation

## 2016-07-22 DIAGNOSIS — M545 Low back pain: Secondary | ICD-10-CM | POA: Diagnosis present

## 2016-07-22 DIAGNOSIS — Z79899 Other long term (current) drug therapy: Secondary | ICD-10-CM | POA: Diagnosis not present

## 2016-07-22 MED ORDER — OXYCODONE-ACETAMINOPHEN 5-325 MG PO TABS: 1.0000 | ORAL_TABLET | Freq: Four times a day (QID) | ORAL | 0 refills | Status: DC | PRN

## 2016-07-22 MED ORDER — PREDNISONE 10 MG (21) PO TBPK: ORAL_TABLET | ORAL | 0 refills | Status: DC

## 2016-07-22 MED ORDER — PREDNISONE 20 MG PO TABS
60.0000 mg | ORAL_TABLET | Freq: Once | ORAL | Status: AC
  Administered 2016-07-22: 60 mg via ORAL
  Filled 2016-07-22: qty 3

## 2016-07-22 MED ORDER — OXYCODONE-ACETAMINOPHEN 5-325 MG PO TABS
1.0000 | ORAL_TABLET | Freq: Once | ORAL | Status: AC
  Administered 2016-07-22: 1 via ORAL
  Filled 2016-07-22: qty 1

## 2016-07-22 NOTE — ED Notes (Signed)
Discharge instructions reviewed with patient. Questions fielded by this RN. Patient verbalizes understanding of instructions. Patient discharged home in stable condition per [place] PA. No acute distress noted at time of discharge.   

## 2016-07-22 NOTE — ED Notes (Signed)
Pt in via triage, states, "I threw my back out early today, as I was getting out of my car."  Pt reports hx of 2 fusions, total of 4 back surgeries.  Pt ambulatory to room with some discomfort.  Pt A/Ox4, no immediate distress noted.

## 2016-07-22 NOTE — ED Provider Notes (Signed)
[facility] Emergency Department Provider Note  ____________________________________________  Time seen: Approximately 9:07 PM  I have reviewed the triage vital signs and the nursing notes.   HISTORY  Chief Complaint Back Pain    HPI Bryan Harvey is a 50 y.o. male with history for back surgeries who presents with acute low back pain some radiation into the right groin. Occurred after getting out of the car. His having spasms and pain with movement. No abdominal pain or nausea. No fevers or chills or urinary symptoms. Last surgery was in 2014, fusion. Anticipating trip to [place] tomorrow.   Past Medical History:  Diagnosis Date  . Back pain   . BACK PAIN WITH RADICULOPATHY 08/19/2008   Qualifier: Diagnosis of  By: Thomos Lemons    . DISC DISEASE, LUMBAR 12/28/2007   Qualifier: Diagnosis of  By: Thomos Lemons    . GASTROESOPHAGEAL REFLUX, NO ESOPHAGITIS 08/30/2006   Qualifier: Diagnosis of  By: Thomos Lemons    . GERD (gastroesophageal reflux disease)   . HERNIA, HIATAL, NONCONGENITAL 08/30/2006   Qualifier: Diagnosis of  By: Thomos Lemons    . Hyperlipidemia   . HYPERLIPIDEMIA 01/19/2008   Qualifier: Diagnosis of  By: Thomos Lemons    . PONV (postoperative nausea and vomiting)     Patient Active Problem List   Diagnosis Date Noted  . Left cervical radiculopathy 03/04/2015  . Left shoulder pain 02/04/2015  . Superficial thrombophlebitis of left leg 11/04/2014  . Osteoarthritis, hip, bilateral 10/07/2014  . Rhabdomyolysis due to statin therapy 08/07/2014  . Colon cancer screening 03/19/2014  . Family history of colon cancer 03/19/2014  . 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 Eczema 01/24/2013  . Hyperlipidemia 01/19/2008  . Lumbar degenerative disc disease 12/28/2007  . OBESITY, NOS 08/30/2006  . GASTROESOPHAGEAL REFLUX, NO ESOPHAGITIS 08/30/2006  . HERNIA, HIATAL, NONCONGENITAL 08/30/2006    Past Surgical History:   Procedure Laterality Date  . ANKLE ARTHROSCOPY Right 1998  . BACK SURGERY    . KNEE CARTILAGE SURGERY Left 1994  . TONSILLECTOMY      Current Outpatient Rx  . Order #: [id number] Class: Print  . Order #: [id number] Class: Print  . Order #: [id number] Class: Print  . Order #: [id number] Class: Print  . Order #: [id number] Class: Print    Allergies Review of patient's allergies indicates no known allergies.  Family History  Problem Relation Age of Onset  . Lymphoma Father   . Diabetes Mother     Social History Social History  Substance Use Topics  . Smoking status: Never Smoker  . Smokeless tobacco: Former Neurosurgeon    Types: Snuff    Quit date: 10/26/1999  . Alcohol use Yes     Comment: social    Review of Systems Constitutional: No fever/chills Eyes: No visual changes. ENT: No sore throat. Cardiovascular: Denies chest pain. Respiratory: Denies shortness of breath. Gastrointestinal: No abdominal pain.  No nausea, no vomiting.  No diarrhea.  No constipation. Genitourinary: Negative for dysuria. Musculoskeletal: per HPI Skin: Negative for rash. Neurological: Negative for headaches, focal weakness or numbness. 10-point ROS otherwise negative.  ____________________________________________   PHYSICAL EXAM:  VITAL SIGNS: ED Triage Vitals  Enc Vitals Group     BP 07/22/16 2031 128/71     Pulse Rate 07/22/16 2031 (!) 104     Resp 07/22/16 2031 16     Temp 07/22/16 2031 98.4 F (36.9 C)     Temp Source 07/22/16 2031 Oral  SpO2 07/22/16 2031 96 %     Weight 07/22/16 2020 275 lb (124.7 kg)     Height 07/22/16 2020 6\' 4"  (1.93 m)     Head Circumference --      Peak Flow --      Pain Score 07/22/16 2020 8     Pain Loc --      Pain Edu? --      Excl. in GC? --     Constitutional: Alert and oriented. Well appearing and in moderate acute distress. Eyes: Conjunctivae are normal.  EOMI. Neck:  Supple.  No adenopathy.   Cardiovascular: Normal rate, regular rhythm. Grossly  normal heart sounds.  Good peripheral circulation. Respiratory: Normal respiratory effort.  No retractions. Lungs CTAB. Gastrointestinal: Soft and nontender. No distention. No abdominal bruits. No CVA tenderness. Musculoskeletal: Pain with range of motion of the lumbar spine and positive straight leg raise bilaterally. Normal internal/external rotation of the hip joints. Neurologic:  Normal speech and language. No gross focal neurologic deficits are appreciated. No gait instability. Skin:  Skin is warm, dry and intact. No rash noted. Psychiatric: Mood and affect are normal. Speech and behavior are normal.  ____________________________________________   LABS (all labs ordered are listed, but only abnormal results are displayed)  Labs Reviewed - No data to display ____________________________________________  EKG   ____________________________________________  RADIOLOGY   ____________________________________________   PROCEDURES  Procedure(s) performed: None  Critical Care performed: No  ____________________________________________   INITIAL IMPRESSION / ASSESSMENT AND PLAN / ED COURSE  Pertinent labs & imaging results that were available during my care of the patient were reviewed by me and considered in my medical decision making (see chart for details).  50 year old with acute low back pain with some radiation to the right groin after getting out of his car. History of 4 back surgeries, the last one in 2014. He is having spasms with increased pain with movement. Treated for sciatica with prednisone taper and Percocet. He has Robaxin at home. He can follow-up with his surgeon or primary care provider for evaluation. I felt there was little benefit for reimaging today. ____________________________________________   FINAL CLINICAL IMPRESSION(S) / ED DIAGNOSES  Final diagnoses:  Bilateral low back pain with right-sided sciatica      Ignacia BayleyRobert Jinnifer Montejano, PA-C 07/22/16  2127    Nita Sicklearolina Veronese, MD 07/23/16 1321

## 2016-07-22 NOTE — ED Triage Notes (Signed)
Patient ambulatory to triage with steady gait, without difficulty or distress noted; pt reports lower back pain radiating down legs and "pulling it getting out of a car"; st hx of same

## 2016-07-22 NOTE — Discharge Instructions (Signed)
Take pain medicine as directed. Begin exercises as you're able. Follow-up with your surgeon if not improving. Return to emergency for any concerns.

## 2016-07-30 ENCOUNTER — Ambulatory Visit: Payer: 59 | Admitting: Sports Medicine

## 2016-07-31 IMAGING — MR MR CERVICAL SPINE W/O CM
5 series · 33 of 48 positions shown · non-contrast
Comparison: Cervical radiographs dated 01/29/2015

CLINICAL DATA: Neck pain and tingling and numbness in the left arm.
Left C8 radiculopathy. Weakness in the left arm.

EXAM:
MRI CERVICAL SPINE WITHOUT CONTRAST
TECHNIQUE: Multiplanar, multisequence MR imaging of the cervical spine was
performed. No intravenous contrast was administered.

[Series 2: T2 · sagittal · 3.0mm · 0.56mm/px · 7 of 18 slices shown (1 of 3)]
[im 1/18]
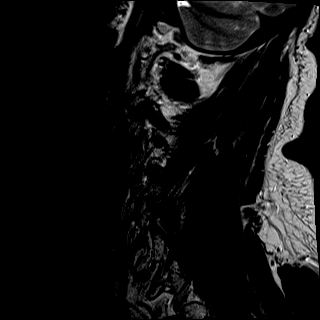
[im 3/18]
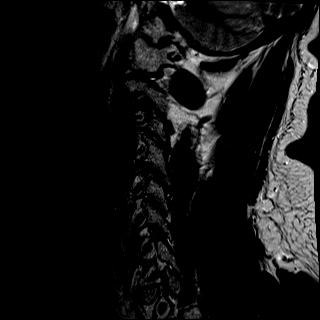
[im 6/18]
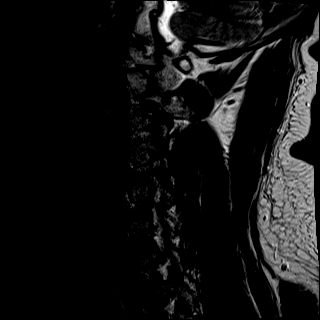
[im 9/18]
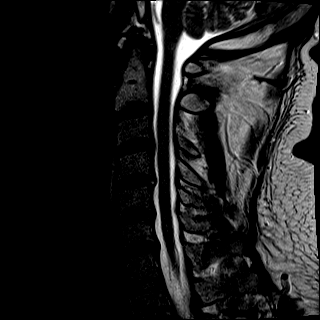
[im 12/18]
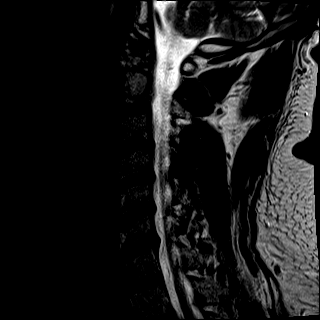
[im 15/18]
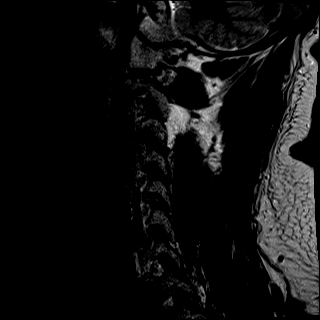
[im 18/18]
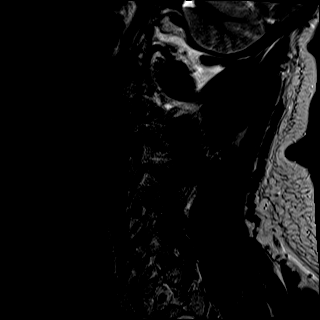

[Series 3: T1 · sagittal · 3.0mm · 0.56mm/px · 7 of 18 slices shown]
[im 1/18]
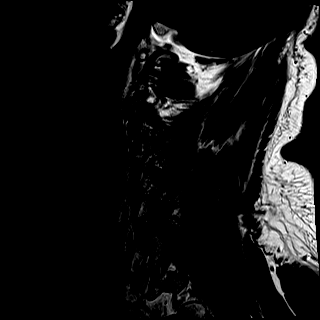
[im 3/18]
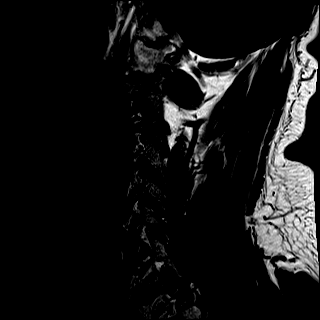
[im 6/18]
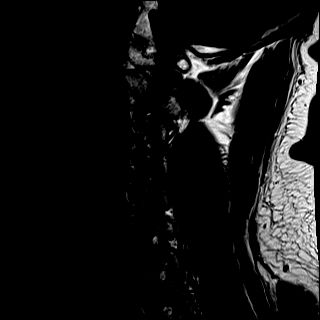
[im 9/18]
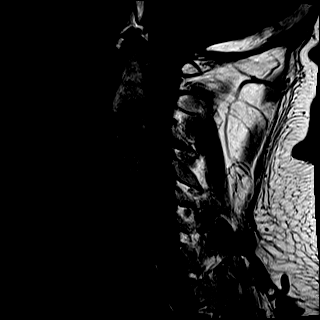
[im 12/18]
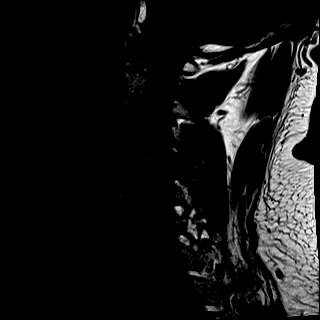
[im 15/18]
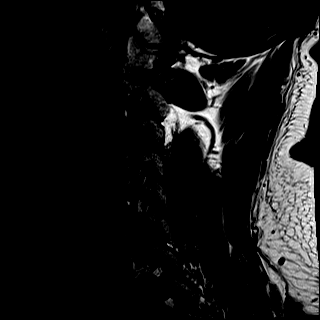
[im 18/18]
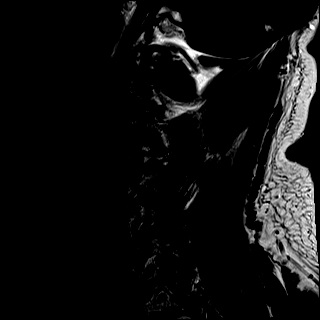

[Series 100: T2 · sagittal · 3.0mm · 0.35mm/px · 8 of 18 slices shown (2 of 3)]
[im 1/18]
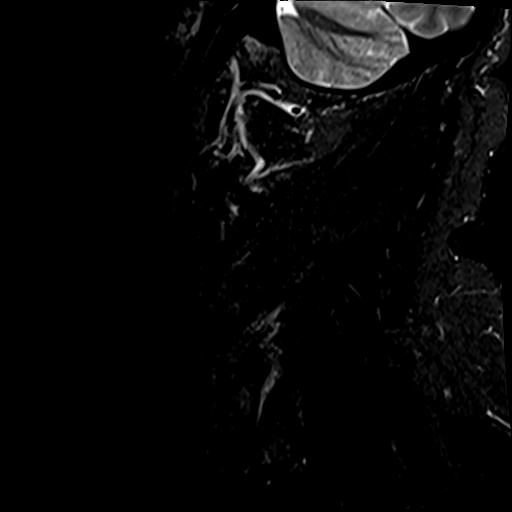
[im 3/18]
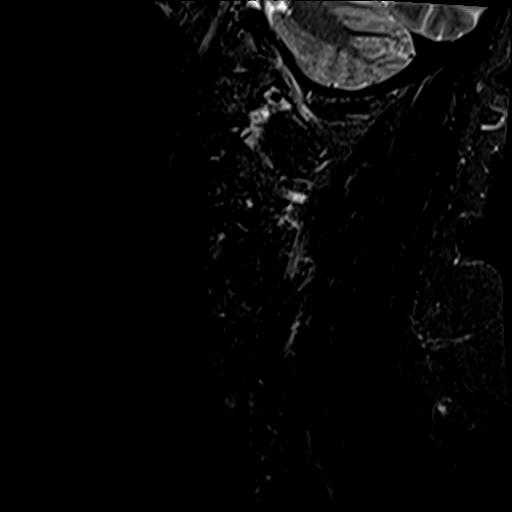
[im 5/18]
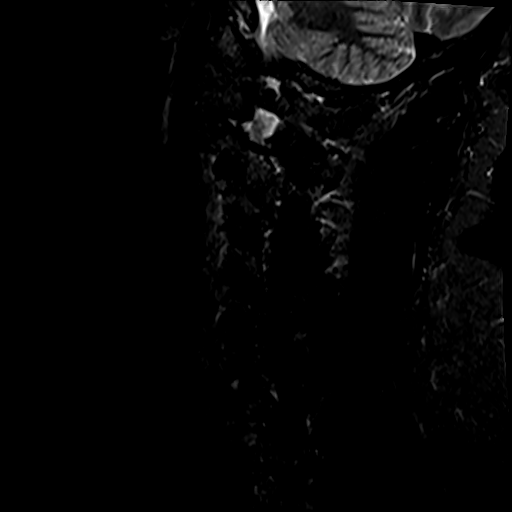
[im 8/18]
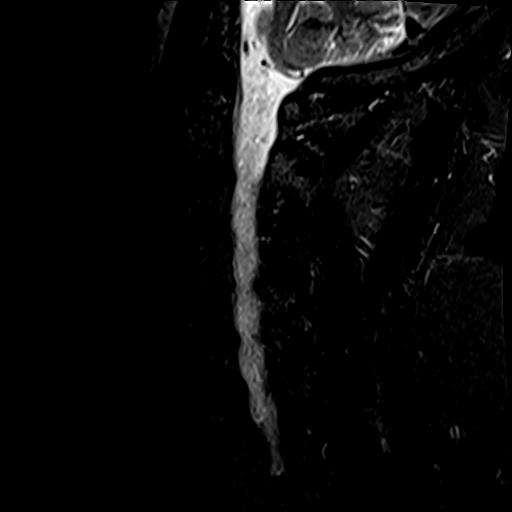
[im 10/18]
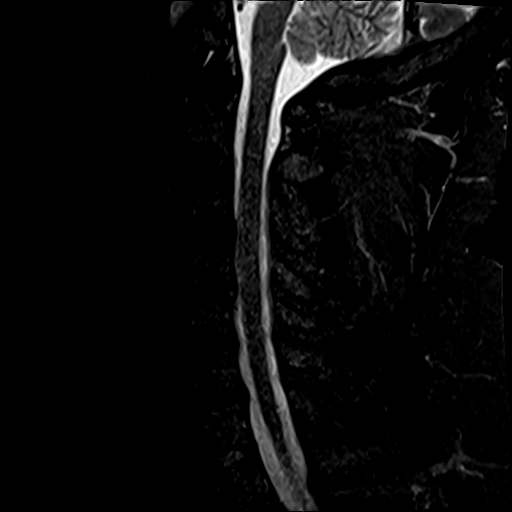
[im 13/18]
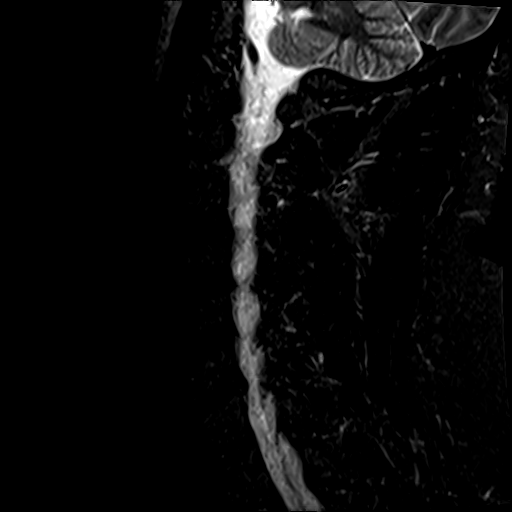
[im 15/18]
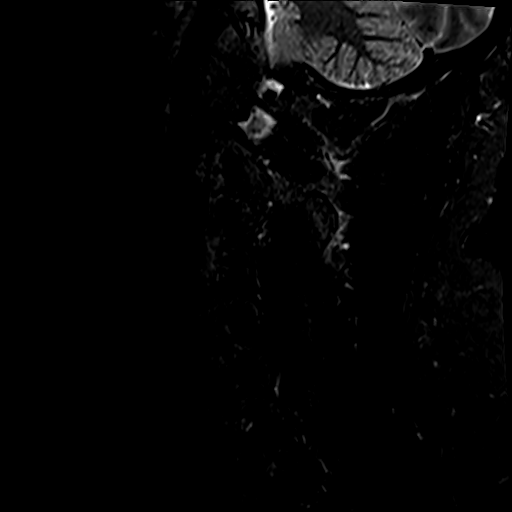
[im 18/18]
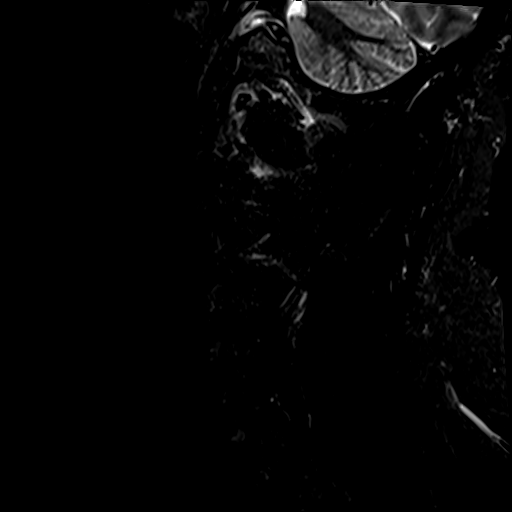

[Series 101: T2 · axial · 3.0mm · 0.62mm/px · z∈[-90,+22]mm · 9 of 30 slices shown (3 of 3)]
[im 1/30]
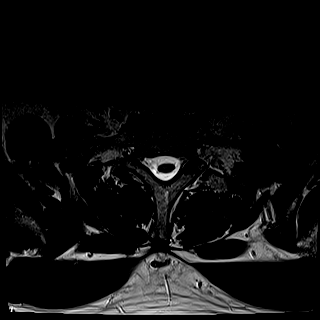
[im 5/30]
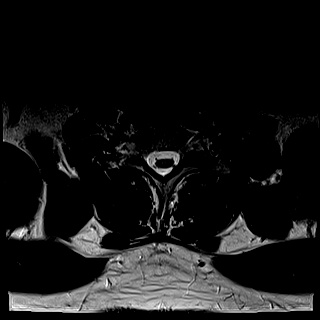
[im 10/30]
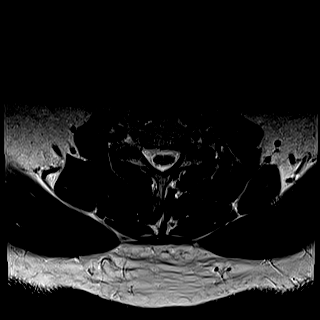
[im 13/30]
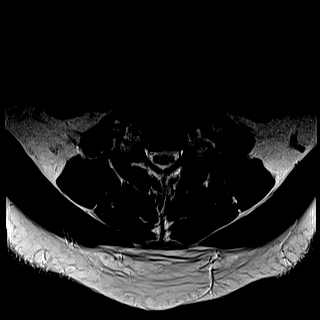
[im 15/30]
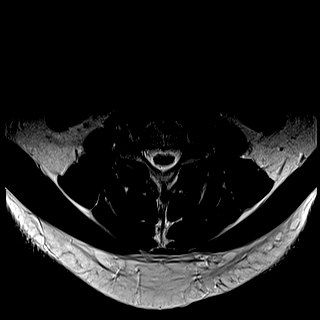
[im 17/30]
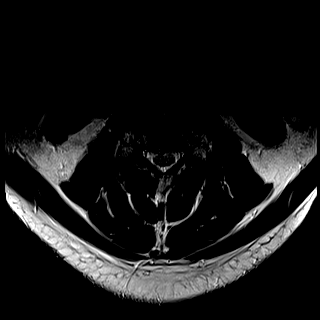
[im 20/30]
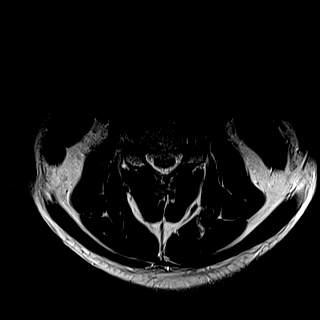
[im 25/30]
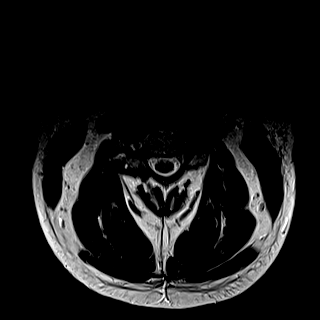
[im 30/30]
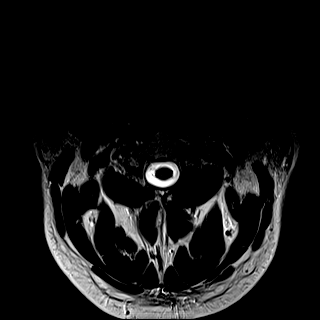

[Series 102: mpgr ax_fil_1 · axial · 3.0mm · 0.35mm/px · z∈[-85,-70]mm · 2 of 30 slices shown]
[im 1/30]
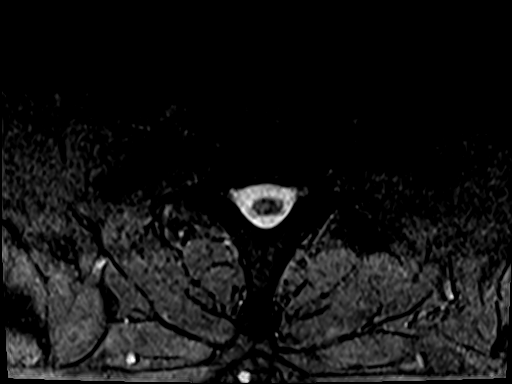
[im 5/30]
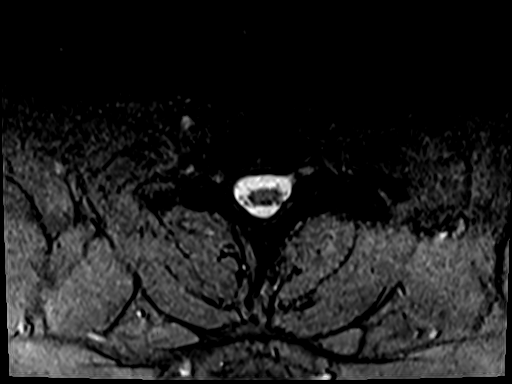

[33 of 48 positions shown; findings below may reference images not displayed]

FINDINGS: The visualized intracranial contents, cervical spinal cord, and
paraspinal soft tissues are normal.

C1-2 and C2-3: Moderately severe right facet arthritis with moderate
right foraminal stenosis at C2-3. C2-3 disc is normal.

C3-4: Slight left facet arthritis with slight left foraminal
stenosis. Tiny central disc bulge.

C4-5: Small broad-based disc bulge with central annular fissure but
without neural impingement. Slight right facet arthritis.

C5-6:  Tiny broad-based disc bulge.  No neural impingement.

C6-7: Tiny broad-based disc bulge. Osteophytes protrude into the
left neural foramen. Slight left foraminal stenosis.

C7-T1: Normal disc. Widely patent neural foramina. Normal facet
joints.
IMPRESSION: 1. Slight left foraminal stenosis at C6-7 and C3-4.
2. Slight right foraminal stenosis at C2-3.

## 2016-08-03 ENCOUNTER — Other Ambulatory Visit: Payer: Self-pay | Admitting: Orthopedic Surgery

## 2016-08-03 DIAGNOSIS — M545 Low back pain: Secondary | ICD-10-CM

## 2016-08-08 ENCOUNTER — Ambulatory Visit
Admission: RE | Admit: 2016-08-08 | Discharge: 2016-08-08 | Disposition: A | Discharge status: Home / Self Care | Payer: 59 | Source: Ambulatory Visit | Attending: Orthopedic Surgery | Admitting: Orthopedic Surgery

## 2016-08-08 DIAGNOSIS — M545 Low back pain: Secondary | ICD-10-CM

## 2016-10-26 ENCOUNTER — Other Ambulatory Visit: Payer: Self-pay | Admitting: Orthopedic Surgery

## 2016-11-08 ENCOUNTER — Encounter (HOSPITAL_COMMUNITY)
Admission: RE | Admit: 2016-11-08 | Discharge: 2016-11-08 | Disposition: A | Discharge status: Home / Self Care | Payer: 59 | Source: Ambulatory Visit | Attending: Orthopedic Surgery | Admitting: Orthopedic Surgery

## 2016-11-08 ENCOUNTER — Ambulatory Visit (HOSPITAL_COMMUNITY)
Admission: RE | Admit: 2016-11-08 | Discharge: 2016-11-08 | Disposition: A | Discharge status: Home / Self Care | Payer: 59 | Source: Ambulatory Visit | Attending: Orthopedic Surgery | Admitting: Orthopedic Surgery

## 2016-11-08 ENCOUNTER — Encounter (HOSPITAL_COMMUNITY): Payer: Self-pay

## 2016-11-08 DIAGNOSIS — M5416 Radiculopathy, lumbar region: Secondary | ICD-10-CM | POA: Diagnosis not present

## 2016-11-08 DIAGNOSIS — Z01818 Encounter for other preprocedural examination: Secondary | ICD-10-CM | POA: Diagnosis present

## 2016-11-08 DIAGNOSIS — Z01810 Encounter for preprocedural cardiovascular examination: Secondary | ICD-10-CM | POA: Insufficient documentation

## 2016-11-08 DIAGNOSIS — Z01812 Encounter for preprocedural laboratory examination: Secondary | ICD-10-CM | POA: Diagnosis not present

## 2016-11-08 HISTORY — DX: Unspecified asthma, uncomplicated: J45.909

## 2016-11-08 LAB — CBC WITH DIFFERENTIAL/PLATELET
BASOS ABS: 0 10*3/uL (ref 0.0–0.1)
BASOS PCT: 0 %
EOS ABS: 0.2 10*3/uL (ref 0.0–0.7)
EOS PCT: 4 %
HCT: 38.2 % — ABNORMAL LOW (ref 39.0–52.0)
Hemoglobin: 13.3 g/dL (ref 13.0–17.0)
LYMPHS PCT: 25 %
Lymphs Abs: 1.5 10*3/uL (ref 0.7–4.0)
MCH: 30.2 pg (ref 26.0–34.0)
MCHC: 34.8 g/dL (ref 30.0–36.0)
MCV: 86.8 fL (ref 78.0–100.0)
MONO ABS: 0.6 10*3/uL (ref 0.1–1.0)
Monocytes Relative: 11 %
Neutro Abs: 3.7 10*3/uL (ref 1.7–7.7)
Neutrophils Relative %: 60 %
PLATELETS: 403 10*3/uL — AB (ref 150–400)
RBC: 4.4 MIL/uL (ref 4.22–5.81)
RDW: 12.3 % (ref 11.5–15.5)
WBC: 6.1 10*3/uL (ref 4.0–10.5)

## 2016-11-08 LAB — TYPE AND SCREEN
ABO/RH(D): O POS
Antibody Screen: NEGATIVE

## 2016-11-08 LAB — COMPREHENSIVE METABOLIC PANEL
ALBUMIN: 4.1 g/dL (ref 3.5–5.0)
ALT: 21 U/L (ref 17–63)
AST: 20 U/L (ref 15–41)
Alkaline Phosphatase: 69 U/L (ref 38–126)
Anion gap: 10 (ref 5–15)
BUN: 23 mg/dL — AB (ref 6–20)
CHLORIDE: 110 mmol/L (ref 101–111)
CO2: 21 mmol/L — AB (ref 22–32)
CREATININE: 1.03 mg/dL (ref 0.61–1.24)
Calcium: 9.3 mg/dL (ref 8.9–10.3)
GFR calc Af Amer: >60 mL/min (ref >60)
Glucose, Bld: 99 mg/dL (ref 65–99)
POTASSIUM: 4 mmol/L (ref 3.5–5.1)
Sodium: 141 mmol/L (ref 135–145)
Total Bilirubin: 0.4 mg/dL (ref 0.3–1.2)
Total Protein: 6.7 g/dL (ref 6.5–8.1)

## 2016-11-08 LAB — URINALYSIS, ROUTINE W REFLEX MICROSCOPIC
Bilirubin Urine: NEGATIVE
GLUCOSE, UA: NEGATIVE mg/dL
Hgb urine dipstick: NEGATIVE
KETONES UR: NEGATIVE mg/dL
LEUKOCYTES UA: NEGATIVE
NITRITE: NEGATIVE
PH: 5 (ref 5.0–8.0)
PROTEIN: NEGATIVE mg/dL
Specific Gravity, Urine: 1.03 (ref 1.005–1.030)

## 2016-11-08 LAB — APTT: APTT: 33 s (ref 24–36)

## 2016-11-08 LAB — PROTIME-INR
INR: 0.91
PROTHROMBIN TIME: 12.3 s (ref 11.4–15.2)

## 2016-11-08 LAB — SURGICAL PCR SCREEN
MRSA, PCR: NEGATIVE
Staphylococcus aureus: NEGATIVE

## 2016-11-08 NOTE — Pre-Procedure Instructions (Signed)
Bryan RudChristopher D Harvey  11/08/2016      [facility] [place] - [id number] [facility] [address] [place] [id number] Phone: [phone] Fax: [phone]  [id number] [place], [place] - [address] [place] [address] Phone: [phone] Fax: [phone]    Your procedure is scheduled on Wednesday December 27.  [facility]:30 [facility].M.  Call this number if you have problems the morning of surgery:  [phone]   Remember:  Do not eat food or drink liquids after midnight.  Take these medicines the morning of surgery with A SIP OF WATER: hydrocodone (Norco) if needed  7 days prior to surgery STOP taking any Aspirin, Aleve, Naproxen, Ibuprofen, Motrin, Advil, Goody's, BC's, all herbal medications, fish oil, and all vitamins    Do not wear jewelry, make-up or nail polish.  Do not wear lotions, powders, or perfumes, or deoderant.  Do not shave 48 hours prior to surgery.  Men may shave face and neck.  Do not bring valuables to the hospital.  [facility] for any belongings or valuables.  Contacts, dentures or bridgework may not be worn into surgery.  Leave your suitcase in the car.  After surgery it may be brought to your room.  For patients admitted to the hospital, discharge time will be determined by your treatment team.  Patients discharged the day of surgery will not be allowed to drive home.   Special instructions:    [place]- Preparing For Surgery  Before surgery, you can play an important role. Because skin is not sterile, your skin needs to be as free of germs as possible. You can reduce the number of germs on your skin by washing with CHG (chlorahexidine gluconate) Soap before surgery.  CHG is an antiseptic cleaner which kills germs and bonds with the skin to continue killing germs even after washing.  Please do not use if you have an  allergy to CHG or antibacterial soaps. If your skin becomes reddened/irritated stop using the CHG.  Do not shave (including legs and underarms) for at least 48 hours prior to first CHG shower. It is OK to shave your face.  Please follow these instructions carefully.   1. Shower the NIGHT BEFORE SURGERY and the MORNING OF SURGERY with CHG.   2. If you chose to wash your hair, wash your hair first as usual with your normal shampoo.  3. After you shampoo, rinse your hair and body thoroughly to remove the shampoo.  4. Use CHG as you would any other liquid soap. You can apply CHG directly to the skin and wash gently with a scrungie or a clean washcloth.   5. Apply the CHG Soap to your body ONLY FROM THE NECK DOWN.  Do not use on open wounds or open sores. Avoid contact with your eyes, ears, mouth and genitals (private parts). Wash genitals (private parts) with your normal soap.  6. Wash thoroughly, paying special attention to the area where your surgery will be performed.  7. Thoroughly rinse your body with warm water from the neck down.  8. DO NOT shower/wash with your normal soap after using and rinsing off the CHG Soap.  9. Pat yourself dry with a CLEAN TOWEL.   10. Wear CLEAN PAJAMAS   11. Place CLEAN SHEETS on your bed the night of your first shower and DO NOT SLEEP WITH PETS.  Day of Surgery: Do not apply any deodorants/lotions. Please wear clean clothes to the hospital/surgery center.      Please read over the following fact sheets that you were given. MRSA Information

## 2016-11-08 NOTE — Progress Notes (Signed)
PCP: Dr. Ivan AnchorsHommel Pt denies cardiologist, cardiac workup or cardiac hx.   EKG: 11/08/16 CXR: 11/08/16  No c/o chest pain, SOB or signs of infection per pt at PAT appointment.

## 2016-11-09 NOTE — Progress Notes (Addendum)
Anesthesia Chart Review:  Patient is a 50 year old male scheduled for left L3-4 lateral interbody fusion on 11/17/16 and L3-4 PLIF on 11/18/16 by Dr. Yevette Edwardsumonski.  History includes non-smoker, post-operative N/V, GERD, HLD, GERD, hiatal hernia, childhood asthma, tonsillectomy, L5-S1 laminectomy '03, L4-5 PLIF 11/01/13. BMI is consistent with obesity.  PCP is listed as Dr. Laren BoomSean Hommel. Also seen by Dr. Pearletha Alfredhomas Thekkedandam [facility].   Meds include Flexeril, Norco, Provigial (not currently taking).  BP 132/82   Pulse 92   Temp 36.6 C   Resp 20   Ht 6\' 4"  (1.93 m)   Wt 294 lb 12.8 oz (133.7 kg)   SpO2 98%   BMI 35.88 kg/m   EKG 11/08/16: NSR, cannot rule out inferior infarct (age undetermined). Inferior leads appear similar and anterior r wave progression has improved when compared to 10/25/13 tracing.  He denied chest pain, SOB, or prior cardiac testing at PAT.   CXR 11/08/16: IMPRESSION: There is no acute cardiopulmonary disease. 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  Preoperative labs noted.  Further evaluation by her assigned anesthesiologist on the day of surgery.  If no acute changes on new CV symptoms then I would anticipate that he could proceed as planned.  Velna Ochsllison Shelah Heatley, PA-C [facility]Anesthesiology Phone [phone] 11/09/2016 4:47 PM

## 2016-11-16 ENCOUNTER — Encounter (HOSPITAL_COMMUNITY): Payer: Self-pay | Admitting: Anesthesiology

## 2016-11-16 MED ORDER — CEFAZOLIN SODIUM 10 G IJ SOLR
3.0000 g | INTRAMUSCULAR | Status: AC
  Administered 2016-11-17: 3 g via INTRAVENOUS
  Filled 2016-11-16: qty 3000

## 2016-11-17 ENCOUNTER — Inpatient Hospital Stay (HOSPITAL_COMMUNITY)
Admission: RE | Admit: 2016-11-17 | Discharge: 2016-11-19 | DRG: 455 | Disposition: A | Discharge status: Home / Self Care | Payer: 59 | Source: Ambulatory Visit | Attending: Orthopedic Surgery | Admitting: Orthopedic Surgery

## 2016-11-17 ENCOUNTER — Inpatient Hospital Stay (HOSPITAL_COMMUNITY): Payer: 59 | Admitting: Vascular Surgery

## 2016-11-17 ENCOUNTER — Inpatient Hospital Stay (HOSPITAL_COMMUNITY): Payer: 59

## 2016-11-17 ENCOUNTER — Encounter (HOSPITAL_COMMUNITY)
Admission: RE | Disposition: A | Discharge status: Home / Self Care | Payer: Self-pay | Source: Ambulatory Visit | Attending: Orthopedic Surgery

## 2016-11-17 ENCOUNTER — Encounter (HOSPITAL_COMMUNITY): Payer: Self-pay | Admitting: *Deleted

## 2016-11-17 ENCOUNTER — Inpatient Hospital Stay (HOSPITAL_COMMUNITY): Payer: 59 | Admitting: Certified Registered Nurse Anesthetist

## 2016-11-17 DIAGNOSIS — Z419 Encounter for procedure for purposes other than remedying health state, unspecified: Secondary | ICD-10-CM

## 2016-11-17 DIAGNOSIS — M5116 Intervertebral disc disorders with radiculopathy, lumbar region: Principal | ICD-10-CM | POA: Diagnosis present

## 2016-11-17 DIAGNOSIS — Z87891 Personal history of nicotine dependence: Secondary | ICD-10-CM

## 2016-11-17 DIAGNOSIS — K449 Diaphragmatic hernia without obstruction or gangrene: Secondary | ICD-10-CM | POA: Diagnosis present

## 2016-11-17 DIAGNOSIS — Z791 Long term (current) use of non-steroidal anti-inflammatories (NSAID): Secondary | ICD-10-CM | POA: Diagnosis not present

## 2016-11-17 DIAGNOSIS — M5410 Radiculopathy, site unspecified: Secondary | ICD-10-CM | POA: Diagnosis present

## 2016-11-17 DIAGNOSIS — K219 Gastro-esophageal reflux disease without esophagitis: Secondary | ICD-10-CM | POA: Diagnosis present

## 2016-11-17 DIAGNOSIS — Z79891 Long term (current) use of opiate analgesic: Secondary | ICD-10-CM

## 2016-11-17 DIAGNOSIS — Z79899 Other long term (current) drug therapy: Secondary | ICD-10-CM | POA: Diagnosis not present

## 2016-11-17 DIAGNOSIS — R262 Difficulty in walking, not elsewhere classified: Secondary | ICD-10-CM

## 2016-11-17 HISTORY — PX: ANTERIOR LAT LUMBAR FUSION: SHX1168

## 2016-11-17 SURGERY — ANTERIOR LATERAL LUMBAR FUSION 1 LEVEL
Anesthesia: General | Site: Back | Laterality: Right

## 2016-11-17 MED ORDER — ONDANSETRON HCL 4 MG/2ML IJ SOLN: 4.0000 mg | INTRAMUSCULAR | Status: DC | PRN

## 2016-11-17 MED ORDER — FENTANYL CITRATE (PF) 100 MCG/2ML IJ SOLN
INTRAMUSCULAR | Status: AC
Start: 2016-11-17 — End: 2016-11-17
  Filled 2016-11-17: qty 2

## 2016-11-17 MED ORDER — SENNOSIDES-DOCUSATE SODIUM 8.6-50 MG PO TABS: 1.0000 | ORAL_TABLET | Freq: Every evening | ORAL | Status: DC | PRN

## 2016-11-17 MED ORDER — MIDAZOLAM HCL 5 MG/5ML IJ SOLN
INTRAMUSCULAR | Status: DC | PRN
  Administered 2016-11-17: 2 mg via INTRAVENOUS

## 2016-11-17 MED ORDER — ACETAMINOPHEN 10 MG/ML IV SOLN
1000.0000 mg | Freq: Once | INTRAVENOUS | Status: AC
  Administered 2016-11-17: 1000 mg via INTRAVENOUS
  Filled 2016-11-17: qty 100

## 2016-11-17 MED ORDER — LACTATED RINGERS IV SOLN: INTRAVENOUS | Status: DC

## 2016-11-17 MED ORDER — ACETAMINOPHEN 325 MG PO TABS: 650.0000 mg | ORAL_TABLET | ORAL | Status: DC | PRN

## 2016-11-17 MED ORDER — PROPOFOL 500 MG/50ML IV EMUL
INTRAVENOUS | Status: DC | PRN
  Administered 2016-11-17: 75 ug/kg/min via INTRAVENOUS
  Administered 2016-11-17: 10:00:00 via INTRAVENOUS

## 2016-11-17 MED ORDER — PHENYLEPHRINE 40 MCG/ML (10ML) SYRINGE FOR IV PUSH (FOR BLOOD PRESSURE SUPPORT)
PREFILLED_SYRINGE | INTRAVENOUS | Status: AC
  Filled 2016-11-17: qty 10

## 2016-11-17 MED ORDER — BISACODYL 5 MG PO TBEC: 5.0000 mg | DELAYED_RELEASE_TABLET | Freq: Every day | ORAL | Status: DC | PRN

## 2016-11-17 MED ORDER — ONDANSETRON HCL 4 MG/2ML IJ SOLN
INTRAMUSCULAR | Status: DC | PRN
  Administered 2016-11-17: 4 mg via INTRAVENOUS

## 2016-11-17 MED ORDER — LACTATED RINGERS IV SOLN
INTRAVENOUS | Status: DC | PRN
Start: 2016-11-17 — End: 2016-11-17
  Administered 2016-11-17 (×2): via INTRAVENOUS

## 2016-11-17 MED ORDER — LACTATED RINGERS IV SOLN
INTRAVENOUS | Status: DC | PRN
  Administered 2016-11-17: 08:00:00 via INTRAVENOUS

## 2016-11-17 MED ORDER — PHENYLEPHRINE HCL 10 MG/ML IJ SOLN
INTRAMUSCULAR | Status: DC | PRN
  Administered 2016-11-17 (×2): 40 ug via INTRAVENOUS
  Administered 2016-11-17 (×4): 80 ug via INTRAVENOUS

## 2016-11-17 MED ORDER — ONDANSETRON HCL 4 MG/2ML IJ SOLN
INTRAMUSCULAR | Status: AC
  Filled 2016-11-17: qty 2

## 2016-11-17 MED ORDER — SODIUM CHLORIDE 0.9% FLUSH: 3.0000 mL | INTRAVENOUS | Status: DC | PRN

## 2016-11-17 MED ORDER — 0.9 % SODIUM CHLORIDE (POUR BTL) OPTIME
TOPICAL | Status: DC | PRN
  Administered 2016-11-17: 1000 mL

## 2016-11-17 MED ORDER — SCOPOLAMINE 1 MG/3DAYS TD PT72
MEDICATED_PATCH | TRANSDERMAL | Status: AC
  Filled 2016-11-17: qty 1

## 2016-11-17 MED ORDER — MEPERIDINE HCL 25 MG/ML IJ SOLN: 6.2500 mg | INTRAMUSCULAR | Status: DC | PRN

## 2016-11-17 MED ORDER — SCOPOLAMINE 1 MG/3DAYS TD PT72SCOPOLAMINE 1 MG/3DAYS
MEDICATED_PATCH | TRANSDERMAL | Status: DC | PRN
Start: 2016-11-17 — End: 2016-11-17
  Administered 2016-11-17: 1 via TRANSDERMAL

## 2016-11-17 MED ORDER — SODIUM CHLORIDE 0.9% FLUSH
3.0000 mL | Freq: Two times a day (BID) | INTRAVENOUS | Status: DC
  Administered 2016-11-18 – 2016-11-19 (×2): 3 mL via INTRAVENOUS

## 2016-11-17 MED ORDER — PROPOFOL 10 MG/ML IV BOLUS
INTRAVENOUS | Status: DC | PRN
  Administered 2016-11-17: 30 mg via INTRAVENOUS
  Administered 2016-11-17 (×2): 50 mg via INTRAVENOUS
  Administered 2016-11-17: 200 mg via INTRAVENOUS

## 2016-11-17 MED ORDER — PANTOPRAZOLE SODIUM 40 MG PO PACK
40.0000 mg | PACK | Freq: Every day | ORAL | Status: DC | PRN
  Administered 2016-11-17 – 2016-11-18 (×2): 40 mg
  Filled 2016-11-17 (×3): qty 20

## 2016-11-17 MED ORDER — DEXAMETHASONE SODIUM PHOSPHATE 10 MG/ML IJ SOLN
INTRAMUSCULAR | Status: AC
  Filled 2016-11-17: qty 1

## 2016-11-17 MED ORDER — PROPOFOL 10 MG/ML IV BOLUS
INTRAVENOUS | Status: AC
  Filled 2016-11-17: qty 40

## 2016-11-17 MED ORDER — OMEPRAZOLE-SODIUM BICARBONATE 20-1100 MG PO CAPS: 1.0000 | ORAL_CAPSULE | Freq: Every day | ORAL | Status: DC | PRN

## 2016-11-17 MED ORDER — ACETAMINOPHEN 650 MG RE SUPP: 650.0000 mg | RECTAL | Status: DC | PRN

## 2016-11-17 MED ORDER — PHENOL 1.4 % MT LIQD: 1.0000 | OROMUCOSAL | Status: DC | PRN

## 2016-11-17 MED ORDER — PROMETHAZINE HCL 25 MG/ML IJ SOLN: 6.2500 mg | INTRAMUSCULAR | Status: DC | PRN

## 2016-11-17 MED ORDER — DEXAMETHASONE SODIUM PHOSPHATE 10 MG/ML IJ SOLN
INTRAMUSCULAR | Status: DC | PRN
  Administered 2016-11-17: 10 mg via INTRAVENOUS

## 2016-11-17 MED ORDER — CEFAZOLIN IN D5W 1 GM/50ML IV SOLN
1.0000 g | Freq: Three times a day (TID) | INTRAVENOUS | Status: AC
  Administered 2016-11-17 – 2016-11-18 (×2): 1 g via INTRAVENOUS
  Filled 2016-11-17 (×2): qty 50

## 2016-11-17 MED ORDER — SODIUM CHLORIDE 0.9 % IV SOLN
INTRAVENOUS | Status: DC
  Administered 2016-11-17: 17:00:00 via INTRAVENOUS

## 2016-11-17 MED ORDER — KETAMINE HCL-SODIUM CHLORIDE 100-0.9 MG/10ML-% IV SOSY
PREFILLED_SYRINGE | INTRAVENOUS | Status: AC
  Filled 2016-11-17: qty 10

## 2016-11-17 MED ORDER — ALUM & MAG HYDROXIDE-SIMETH 200-200-20 MG/5ML PO SUSP: 30.0000 mL | Freq: Four times a day (QID) | ORAL | Status: DC | PRN

## 2016-11-17 MED ORDER — PHENYLEPHRINE HCL 10 MG/ML IJ SOLN
INTRAVENOUS | Status: DC | PRN
  Administered 2016-11-17: 80 ug/min via INTRAVENOUS

## 2016-11-17 MED ORDER — HYDROMORPHONE HCL 1 MG/ML IJ SOLN
INTRAMUSCULAR | Status: AC
  Filled 2016-11-17: qty 1

## 2016-11-17 MED ORDER — FENTANYL CITRATE (PF) 100 MCG/2ML IJ SOLN
INTRAMUSCULAR | Status: DC | PRN
  Administered 2016-11-17: 100 ug via INTRAVENOUS
  Administered 2016-11-17 (×3): 50 ug via INTRAVENOUS

## 2016-11-17 MED ORDER — FLEET ENEMA 7-19 GM/118ML RE ENEM: 1.0000 | ENEMA | Freq: Once | RECTAL | Status: DC | PRN

## 2016-11-17 MED ORDER — OXYCODONE-ACETAMINOPHEN 5-325 MG PO TABS
1.0000 | ORAL_TABLET | ORAL | Status: DC | PRN
  Administered 2016-11-17 – 2016-11-19 (×8): 2 via ORAL
  Filled 2016-11-17 (×8): qty 2

## 2016-11-17 MED ORDER — LIDOCAINE 2% (20 MG/ML) 5 ML SYRINGE
INTRAMUSCULAR | Status: AC
  Filled 2016-11-17: qty 5

## 2016-11-17 MED ORDER — DOCUSATE SODIUM 100 MG PO CAPS
100.0000 mg | ORAL_CAPSULE | Freq: Two times a day (BID) | ORAL | Status: DC
  Administered 2016-11-17 – 2016-11-19 (×4): 100 mg via ORAL
  Filled 2016-11-17 (×4): qty 1

## 2016-11-17 MED ORDER — ZOLPIDEM TARTRATE 5 MG PO TABS: 5.0000 mg | ORAL_TABLET | Freq: Every evening | ORAL | Status: DC | PRN

## 2016-11-17 MED ORDER — BUPIVACAINE-EPINEPHRINE 0.25% -1:200000 IJ SOLN
INTRAMUSCULAR | Status: DC | PRN
  Administered 2016-11-17: 6 mL

## 2016-11-17 MED ORDER — MENTHOL 3 MG MT LOZG: 1.0000 | LOZENGE | OROMUCOSAL | Status: DC | PRN

## 2016-11-17 MED ORDER — CEFAZOLIN SODIUM-DEXTROSE 2-4 GM/100ML-% IV SOLN: 2.0000 g | INTRAVENOUS | Status: DC

## 2016-11-17 MED ORDER — MORPHINE SULFATE (PF) 2 MG/ML IV SOLN
1.0000 mg | INTRAVENOUS | Status: DC | PRN
  Administered 2016-11-17 – 2016-11-19 (×9): 2 mg via INTRAVENOUS
  Filled 2016-11-17 (×9): qty 1

## 2016-11-17 MED ORDER — DIAZEPAM 5 MG PO TABS
5.0000 mg | ORAL_TABLET | Freq: Four times a day (QID) | ORAL | Status: DC | PRN
  Administered 2016-11-17 – 2016-11-18 (×3): 5 mg via ORAL
  Filled 2016-11-17 (×3): qty 1

## 2016-11-17 MED ORDER — HYDROMORPHONE HCL 1 MG/ML IJ SOLN
0.2500 mg | INTRAMUSCULAR | Status: DC | PRN
  Administered 2016-11-17 (×4): 0.5 mg via INTRAVENOUS

## 2016-11-17 MED ORDER — SODIUM CHLORIDE 0.9 % IV SOLN: 250.0000 mL | INTRAVENOUS | Status: DC

## 2016-11-17 MED ORDER — SUCCINYLCHOLINE CHLORIDE 200 MG/10ML IV SOSY
PREFILLED_SYRINGE | INTRAVENOUS | Status: AC
  Filled 2016-11-17: qty 10

## 2016-11-17 MED ORDER — THROMBIN 20000 UNITS EX SOLR
CUTANEOUS | Status: DC | PRN
Start: 2016-11-17 — End: 2016-11-17
  Administered 2016-11-17: 20 mL via TOPICAL

## 2016-11-17 MED ORDER — POVIDONE-IODINE 7.5 % EX SOLN: Freq: Once | CUTANEOUS | Status: DC

## 2016-11-17 MED ORDER — MIDAZOLAM HCL 2 MG/2ML IJ SOLN
INTRAMUSCULAR | Status: AC
  Filled 2016-11-17: qty 2

## 2016-11-17 MED ORDER — POVIDONE-IODINE 7.5 % EX SOLN
Freq: Once | CUTANEOUS | Status: DC
  Filled 2016-11-17: qty 118

## 2016-11-17 MED ORDER — FENTANYL CITRATE (PF) 100 MCG/2ML IJ SOLN
INTRAMUSCULAR | Status: AC
  Filled 2016-11-17: qty 4

## 2016-11-17 MED ORDER — KETAMINE HCL 10 MG/ML IJ SOLN
INTRAMUSCULAR | Status: DC | PRN
  Administered 2016-11-17: 65 mg via INTRAVENOUS

## 2016-11-17 MED ORDER — SUCCINYLCHOLINE CHLORIDE 20 MG/ML IJ SOLN
INTRAMUSCULAR | Status: DC | PRN
  Administered 2016-11-17: 120 mg via INTRAVENOUS

## 2016-11-17 MED ORDER — THROMBIN 20000 UNITS EX SOLR
CUTANEOUS | Status: AC
  Filled 2016-11-17: qty 20000

## 2016-11-17 MED ORDER — LIDOCAINE HCL (CARDIAC) 20 MG/ML IV SOLN
INTRAVENOUS | Status: DC | PRN
Start: 2016-11-17 — End: 2016-11-17
  Administered 2016-11-17: 80 mg via INTRAVENOUS

## 2016-11-17 SURGICAL SUPPLY — 71 items
APL SKNCLS STERI-STRIP NONHPOA (GAUZE/BANDAGES/DRESSINGS) ×1
BENZOIN TINCTURE PRP APPL 2/3 (GAUZE/BANDAGES/DRESSINGS) ×2 IMPLANT
BLADE SURG 10 STRL SS (BLADE) ×3 IMPLANT
BLADE SURG ROTATE 9660 (MISCELLANEOUS) IMPLANT
BOLT PLATE XLIF 5.5X55 LRG (Bolt) ×2 IMPLANT
BOLT PLATE XLIF 5.5X55MM LRG (Bolt) ×2 IMPLANT
CLOSURE WOUND 1/2 X4 (GAUZE/BANDAGES/DRESSINGS) ×1
COVER BACK TABLE 80X110 HD (DRAPES) ×3 IMPLANT
COVER SURGICAL LIGHT HANDLE (MISCELLANEOUS) ×3 IMPLANT
DRAPE C-ARM 42X72 X-RAY (DRAPES) ×3 IMPLANT
DRAPE C-ARMOR (DRAPES) ×2 IMPLANT
DRAPE POUCH INSTRU U-SHP 10X18 (DRAPES) ×3 IMPLANT
DRAPE SURG 17X23 STRL (DRAPES) ×12 IMPLANT
DURAPREP 26ML APPLICATOR (WOUND CARE) ×3 IMPLANT
ELECT BLADE 6.5 EXT (BLADE) ×3 IMPLANT
ELECT CAUTERY BLADE 6.4 (BLADE) ×3 IMPLANT
ELECT REM PT RETURN 9FT ADLT (ELECTROSURGICAL) ×3
ELECTRODE REM PT RTRN 9FT ADLT (ELECTROSURGICAL) ×1 IMPLANT
GAUZE SPONGE 4X4 12PLY STRL (GAUZE/BANDAGES/DRESSINGS) ×3 IMPLANT
GAUZE SPONGE 4X4 16PLY XRAY LF (GAUZE/BANDAGES/DRESSINGS) ×3 IMPLANT
GLOVE BIO SURGEON STRL SZ7 (GLOVE) ×10 IMPLANT
GLOVE BIO SURGEON STRL SZ8 (GLOVE) ×3 IMPLANT
GLOVE BIOGEL PI IND STRL 7.0 (GLOVE) ×1 IMPLANT
GLOVE BIOGEL PI IND STRL 7.5 (GLOVE) IMPLANT
GLOVE BIOGEL PI IND STRL 8 (GLOVE) ×1 IMPLANT
GLOVE BIOGEL PI INDICATOR 7.0 (GLOVE) ×2
GLOVE BIOGEL PI INDICATOR 7.5 (GLOVE) ×4
GLOVE BIOGEL PI INDICATOR 8 (GLOVE) ×2
GOWN STRL REUS W/ TWL LRG LVL3 (GOWN DISPOSABLE) ×2 IMPLANT
GOWN STRL REUS W/ TWL XL LVL3 (GOWN DISPOSABLE) ×2 IMPLANT
GOWN STRL REUS W/TWL LRG LVL3 (GOWN DISPOSABLE) ×6
GOWN STRL REUS W/TWL XL LVL3 (GOWN DISPOSABLE) ×6
IMPLANT COROENT XL 12X22X55 ×2 IMPLANT
KIT BASIN OR (CUSTOM PROCEDURE TRAY) ×3 IMPLANT
KIT DILATOR XLIF 5 (KITS) IMPLANT
KIT ROOM TURNOVER OR (KITS) ×3 IMPLANT
KIT SURGICAL ACCESS MAXCESS 4 (KITS) ×2 IMPLANT
KIT XLIF (KITS) ×2
MARKER SKIN DUAL TIP RULER LAB (MISCELLANEOUS) ×3 IMPLANT
MIX DBX 10CC 35% BONE (Bone Implant) ×3 IMPLANT
MODULE EMG NDL SSEP NVM5 (NEEDLE) IMPLANT
MODULE EMG NEEDLE SSEP NVM5 (NEEDLE) ×6 IMPLANT
MODULE NVM5 NEXT GEN EMG (NEEDLE) ×2 IMPLANT
NDL HYPO 25GX1X1/2 BEV (NEEDLE) ×1 IMPLANT
NDL SPNL 18GX3.5 QUINCKE PK (NEEDLE) ×1 IMPLANT
NEEDLE HYPO 25GX1X1/2 BEV (NEEDLE) ×3 IMPLANT
NEEDLE SPNL 18GX3.5 QUINCKE PK (NEEDLE) ×3 IMPLANT
NS IRRIG 1000ML POUR BTL (IV SOLUTION) ×6 IMPLANT
PACK LAMINECTOMY ORTHO (CUSTOM PROCEDURE TRAY) ×3 IMPLANT
PACK UNIVERSAL I (CUSTOM PROCEDURE TRAY) ×3 IMPLANT
PAD ARMBOARD 7.5X6 YLW CONV (MISCELLANEOUS) ×6 IMPLANT
PLATE DECADE XLIP 2H SZ12 (Plate) ×2 IMPLANT
SPONGE INTESTINAL PEANUT (DISPOSABLE) ×6 IMPLANT
SPONGE LAP 4X18 X RAY DECT (DISPOSABLE) ×3 IMPLANT
SPONGE SURGIFOAM ABS GEL 100 (HEMOSTASIS) IMPLANT
STAPLER VISISTAT 35W (STAPLE) ×3 IMPLANT
STRIP CLOSURE SKIN 1/2X4 (GAUZE/BANDAGES/DRESSINGS) ×2 IMPLANT
SURFACE SSEP/EMG (MISCELLANEOUS) ×2 IMPLANT
SURGIFLO W/THROMBIN 8M KIT (HEMOSTASIS) IMPLANT
SUT MNCRL AB 4-0 PS2 18 (SUTURE) ×3 IMPLANT
SUT VIC AB 0 CT1 18XCR BRD 8 (SUTURE) ×1 IMPLANT
SUT VIC AB 0 CT1 8-18 (SUTURE) ×3
SUT VIC AB 2-0 CT2 18 VCP726D (SUTURE) ×3 IMPLANT
SYR BULB IRRIGATION 50ML (SYRINGE) ×3 IMPLANT
SYR CONTROL 10ML LL (SYRINGE) ×3 IMPLANT
TAPE CLOTH SURG 4X10 WHT LF (GAUZE/BANDAGES/DRESSINGS) ×3 IMPLANT
TOWEL OR 17X24 6PK STRL BLUE (TOWEL DISPOSABLE) ×3 IMPLANT
TOWEL OR 17X26 10 PK STRL BLUE (TOWEL DISPOSABLE) ×3 IMPLANT
TRAY FOLEY CATH 16FR SILVER (SET/KITS/TRAYS/PACK) ×3 IMPLANT
WATER STERILE IRR 1000ML POUR (IV SOLUTION) ×3 IMPLANT
YANKAUER SUCT BULB TIP NO VENT (SUCTIONS) ×3 IMPLANT

## 2016-11-17 NOTE — Anesthesia Preprocedure Evaluation (Addendum)
Anesthesia Evaluation  Patient identified by MRN, date of birth, ID band Patient awake    Reviewed: Allergy & Precautions, NPO status , Patient's Chart, lab work & pertinent test results  History of Anesthesia Complications (+) PONV  Airway Mallampati: II  TM Distance: >3 FB Neck ROM: Full    Dental  (+) Dental Advisory Given, Chipped, Missing, Poor Dentition   Pulmonary neg pulmonary ROS,    breath sounds clear to auscultation       Cardiovascular negative cardio ROS   Rhythm:Regular Rate:Normal     Neuro/Psych Chronic back pain: narcotics    GI/Hepatic Neg liver ROS, hiatal hernia, GERD  Medicated and Controlled,  Endo/Other  Morbid obesity  Renal/GU negative Renal ROS     Musculoskeletal negative musculoskeletal ROS (+)   Abdominal (+) + obese,   Peds  Hematology negative hematology ROS (+)   Anesthesia Other Findings   Reproductive/Obstetrics                            Anesthesia Physical Anesthesia Plan  ASA: II  Anesthesia Plan: General   Post-op Pain Management:    Induction: Intravenous  Airway Management Planned: Oral ETT  Additional Equipment:   Intra-op Plan:   Post-operative Plan: Extubation in OR  Informed Consent: I have reviewed the patients History and Physical, chart, labs and discussed the procedure including the risks, benefits and alternatives for the proposed anesthesia with the patient or authorized representative who has indicated his/her understanding and acceptance.   Dental advisory given  Plan Discussed with: Surgeon and CRNA  Anesthesia Plan Comments: (Plan routine monitors, GETA)        Anesthesia Quick Evaluation

## 2016-11-17 NOTE — Anesthesia Postprocedure Evaluation (Signed)
Anesthesia Post Note  Patient: Bryan Harvey  Procedure(s) Performed: Procedure(s) (LRB): RIGHT  SIDED LUMBAR 3-4 LATERAL INTERBODY FUSION WITH INSTRUMENTATION AND ALLOGRAFT (Right)  Patient location during evaluation: PACU Anesthesia Type: General Level of consciousness: sedated and patient cooperative Pain management: pain level controlled Vital Signs Assessment: post-procedure vital signs reviewed and stable Respiratory status: spontaneous breathing Cardiovascular status: stable Anesthetic complications: no       Last Vitals:  Vitals:   11/17/16 1203 11/17/16 1218  BP: (!) 124/95 124/87  Pulse: 67 68  Resp: 15 15  Temp:      Last Pain:  Vitals:   11/17/16 1218  TempSrc:   PainSc: Asleep                 Lewie LoronJohn Jeanett Antonopoulos

## 2016-11-17 NOTE — H&P (Signed)
PREOPERATIVE H&P  Chief Complaint: Left leg pain  HPI: Bryan Harvey is a 50 y.o. 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 Patient is s/p a previous L5/S1 and L4/5 fusion.  Patient has failed multiple forms of conservative care and continues to have pain (see office notes for additional details regarding the patient's full course of treatment)  Past Medical History:  Diagnosis Date  . Back pain   . BACK PAIN WITH RADICULOPATHY 08/19/2008   Qualifier: Diagnosis of  By: Thomos LemonsBowen DO, Karen    . Childhood asthma    no problems since the age of 50  . DISC DISEASE, LUMBAR 12/28/2007   Qualifier: Diagnosis of  By: Thomos LemonsBowen DO, Karen    . GASTROESOPHAGEAL REFLUX, NO ESOPHAGITIS 08/30/2006   Qualifier: Diagnosis of  By: Thomos LemonsBowen DO, Karen    . GERD (gastroesophageal reflux disease)   . HERNIA, HIATAL, NONCONGENITAL 08/30/2006   Qualifier: Diagnosis of  By: Thomos LemonsBowen DO, Karen    . Hyperlipidemia   . HYPERLIPIDEMIA 01/19/2008   Qualifier: Diagnosis of  By: Thomos LemonsBowen DO, Karen    . PONV (postoperative nausea and vomiting)    2003 surgery, not after last fusion surgery   Past Surgical History:  Procedure Laterality Date  . ANKLE ARTHROSCOPY Right 1998  . BACK SURGERY    . KNEE CARTILAGE SURGERY Left 1994  . SHOULDER ARTHROSCOPY Left    torn labrum  . TONSILLECTOMY     Social History   Social History  . Marital status: Married    Spouse name: N/A  . Number of children: N/A  . Years of education: N/A   Social History Main Topics  . Smoking status: Never Smoker  . Smokeless tobacco: Former NeurosurgeonUser    Types: Snuff    Quit date: 10/26/1999  . Alcohol use Yes     Comment: social, a couple beers a month  . Drug use: No  . Sexual activity: Yes   Other Topics Concern  . None   Social History Narrative  . None   Family History  Problem Relation Age of Onset  . Lymphoma Father   . Diabetes Mother    Allergies    Allergen Reactions  . No Known Allergies    Prior to Admission medications   Medication Sig Start Date End Date Taking? Authorizing Provider  cyclobenzaprine (FLEXERIL) 10 MG tablet Take 10 mg by mouth at bedtime.   Yes Historical Provider, MD  HYDROcodone-acetaminophen (NORCO) 10-325 MG tablet Take 1 tablet by mouth every 6 (six) hours as needed for pain. 10/18/16  Yes Historical Provider, MD  ibuprofen (ADVIL,MOTRIN) 200 MG tablet Take 600 mg by mouth every 6 (six) hours as needed for moderate pain.   Yes Historical Provider, MD  Omeprazole-Sodium Bicarbonate (ZEGERID OTC PO) Take 1 tablet by mouth daily as needed (heartburn).   Yes Historical Provider, MD  triamcinolone cream (KENALOG) 0.1 % Apply 1 application topically 2 (two) times daily as needed (eczema).   Yes Historical Provider, MD  modafinil (PROVIGIL) 200 MG tablet Take 1 tablet (200 mg total) by mouth daily. Please dispense generic.  NEED FOLLOW UP APPOINTMENT FOR MORE REFILLS. Patient not taking: Reported on 11/03/2016 07/15/16   Laren BoomSean Hommel, DO  oxyCODONE-acetaminophen (ROXICET) 5-325 MG tablet Take 1 tablet by mouth every 6 (six) hours as needed. Patient not taking: Reported on 11/03/2016 07/22/16 07/22/17  Ignacia Bayleyobert Tumey, PA-C  predniSONE (STERAPRED UNI-PAK 21 TAB) 10 MG (21) TBPK tablet 6 tablets on day 1, 5 tablets on day 2, 4 tablets on day 3, etc... Patient not taking: Reported on 11/03/2016 07/22/16   Ignacia Bayleyobert Tumey, PA-C     All other systems have been reviewed and were otherwise negative with the exception of those mentioned in the HPI and as above.  Physical Exam: Vitals:   11/17/16 0731 11/17/16 0733  BP:  112/85  Pulse:  95  Resp: 20   Temp: 98.2 F (36.8 C)     General: Alert, no acute distress Cardiovascular: No pedal edema Respiratory: No cyanosis, no use of accessory musculature Skin: No lesions in the area of chief complaint Neurologic: Sensation intact distally Psychiatric: Patient is competent for  consent with normal mood and affect Lymphatic: No axillary or cervical lymphadenopathy  MUSCULOSKELETAL: + SLR on the left  Assessment/Plan: Left lumbar radiculopathy Plan for Procedure(s): RIGHT SIDED LUMBAR 3-4 LATERAL INTERBODY FUSION WITH INSTRUMENTATION AND ALLOGRAFT   Emilee HeroUMONSKI,Lynanne Delgreco LEONARD, MD 11/17/2016 7:50 AM

## 2016-11-17 NOTE — Transfer of Care (Signed)
Immediate Anesthesia Transfer of Care Note  Patient: Bryan Harvey  Procedure(s) Performed: Procedure(s) with comments: RIGHT  SIDED LUMBAR 3-4 LATERAL INTERBODY FUSION WITH INSTRUMENTATION AND ALLOGRAFT (Right) - LEFT SIDED LUMBAR 3-4 LATERAL INTERBODY FUSION WITH INSTRUMENTATION AND ALLOGRAFT  Patient Location: PACU  Anesthesia Type:General  Level of Consciousness: awake, patient cooperative and lethargic  Airway & Oxygen Therapy: Patient Spontanous Breathing and Patient connected to face mask oxygen  Post-op Assessment: Report given to RN, Post -op Vital signs reviewed and stable and Patient moving all extremities X 4  Post vital signs: Reviewed and stable  Last Vitals:  Vitals:   11/17/16 0733 11/17/16 1133  BP: 112/85 (!) 135/94  Pulse: 95 82  Resp:  10  Temp:  36.4 C    Last Pain:  Vitals:   11/17/16 0731  TempSrc: Oral  PainSc:       Patients Stated Pain Goal: 2 (11/17/16 0725)  Complications: No apparent anesthesia complications

## 2016-11-17 NOTE — Anesthesia Preprocedure Evaluation (Addendum)
Anesthesia Evaluation  Patient identified by MRN, date of birth, ID band Patient awake    Reviewed: Allergy & Precautions, NPO status , Patient's Chart, lab work & pertinent test results  History of Anesthesia Complications (+) PONV and history of anesthetic complications  Airway Mallampati: III  TM Distance: >3 FB Neck ROM: Full    Dental  (+) Teeth Intact, Dental Advisory Given   Pulmonary asthma ,    breath sounds clear to auscultation       Cardiovascular + Peripheral Vascular Disease   Rhythm:Regular Rate:Normal     Neuro/Psych  Neuromuscular disease    GI/Hepatic Neg liver ROS, GERD  Medicated and Controlled,  Endo/Other  negative endocrine ROS  Renal/GU negative Renal ROS  negative genitourinary   Musculoskeletal  (+) Arthritis , Osteoarthritis,    Abdominal   Peds negative pediatric ROS (+)  Hematology negative hematology ROS (+)   Anesthesia Other Findings   Reproductive/Obstetrics negative OB ROS                           Lab Results  Component Value Date   WBC 6.1 11/08/2016   HGB 13.3 11/08/2016   HCT 38.2 (L) 11/08/2016   MCV 86.8 11/08/2016   PLT 403 (H) 11/08/2016   Lab Results  Component Value Date   CREATININE 1.03 11/08/2016   BUN 23 (H) 11/08/2016   NA 141 11/08/2016   K 4.0 11/08/2016   CL 110 11/08/2016   CO2 21 (L) 11/08/2016   Lab Results  Component Value Date   INR 0.91 11/08/2016   INR 0.86 10/25/2013   EKG: normal sinus rhythm.  Anesthesia Physical Anesthesia Plan  ASA: II  Anesthesia Plan: General   Post-op Pain Management:    Induction: Intravenous  Airway Management Planned: Oral ETT  Additional Equipment:   Intra-op Plan:   Post-operative Plan: Extubation in OR  Informed Consent: I have reviewed the patients History and Physical, chart, labs and discussed the procedure including the risks, benefits and alternatives for the  proposed anesthesia with the patient or authorized representative who has indicated his/her understanding and acceptance.   Dental advisory given  Plan Discussed with: CRNA  Anesthesia Plan Comments:         Anesthesia Quick Evaluation

## 2016-11-17 NOTE — Anesthesia Procedure Notes (Signed)
Procedure Name: Intubation Date/Time: 11/17/2016 8:43 AM Performed by: Lovie CholOCK, Andrius Andrepont K Pre-anesthesia Checklist: Patient identified, Emergency Drugs available, Suction available and Patient being monitored Patient Re-evaluated:Patient Re-evaluated prior to inductionOxygen Delivery Method: Circle System Utilized Preoxygenation: Pre-oxygenation with 100% oxygen Intubation Type: IV induction Ventilation: Mask ventilation without difficulty Laryngoscope Size: Miller and 3 Grade View: Grade II Tube type: Oral Tube size: 8.0 mm Number of attempts: 1 Airway Equipment and Method: Stylet and Oral airway Placement Confirmation: ETT inserted through vocal cords under direct vision,  positive ETCO2 and breath sounds checked- equal and bilateral Secured at: 23 cm Tube secured with: Tape Dental Injury: Teeth and Oropharynx as per pre-operative assessment

## 2016-11-18 ENCOUNTER — Inpatient Hospital Stay (HOSPITAL_COMMUNITY): Payer: 59 | Admitting: Vascular Surgery

## 2016-11-18 ENCOUNTER — Inpatient Hospital Stay (HOSPITAL_COMMUNITY): Payer: 59

## 2016-11-18 ENCOUNTER — Encounter (HOSPITAL_COMMUNITY): Payer: Self-pay | Admitting: Anesthesiology

## 2016-11-18 ENCOUNTER — Inpatient Hospital Stay (HOSPITAL_COMMUNITY): Admission: RE | Admit: 2016-11-18 | Payer: 59 | Source: Ambulatory Visit | Admitting: Orthopedic Surgery

## 2016-11-18 ENCOUNTER — Encounter (HOSPITAL_COMMUNITY)
Admission: RE | Disposition: A | Discharge status: Home / Self Care | Payer: Self-pay | Source: Ambulatory Visit | Attending: Orthopedic Surgery

## 2016-11-18 SURGERY — POSTERIOR LUMBAR FUSION 1 LEVEL
Anesthesia: General | Site: Spine Lumbar

## 2016-11-18 MED ORDER — THROMBIN 20000 UNITS EX KIT
PACK | CUTANEOUS | Status: DC | PRN
  Administered 2016-11-18: 20000 [IU] via TOPICAL

## 2016-11-18 MED ORDER — 0.9 % SODIUM CHLORIDE (POUR BTL) OPTIME
TOPICAL | Status: DC | PRN
  Administered 2016-11-18: 1000 mL

## 2016-11-18 MED ORDER — DEXAMETHASONE SODIUM PHOSPHATE 10 MG/ML IJ SOLN
INTRAMUSCULAR | Status: AC
  Filled 2016-11-18: qty 1

## 2016-11-18 MED ORDER — PROPOFOL 10 MG/ML IV BOLUS
INTRAVENOUS | Status: DC | PRN
  Administered 2016-11-18: 50 mg via INTRAVENOUS
  Administered 2016-11-18: 200 mg via INTRAVENOUS
  Administered 2016-11-18: 50 mg via INTRAVENOUS

## 2016-11-18 MED ORDER — HYDROMORPHONE HCL 1 MG/ML IJ SOLN: 0.2500 mg | INTRAMUSCULAR | Status: DC | PRN

## 2016-11-18 MED ORDER — ARTIFICIAL TEARS OP OINT
TOPICAL_OINTMENT | OPHTHALMIC | Status: DC | PRN
  Administered 2016-11-18: 1 via OPHTHALMIC

## 2016-11-18 MED ORDER — METHYLENE BLUE 0.5 % INJ SOLN
INTRAVENOUS | Status: AC
  Filled 2016-11-18: qty 10

## 2016-11-18 MED ORDER — BUPIVACAINE-EPINEPHRINE 0.25% -1:200000 IJ SOLN
INTRAMUSCULAR | Status: DC | PRN
  Administered 2016-11-18: 6 mL
  Administered 2016-11-18: 20 mL

## 2016-11-18 MED ORDER — GELATIN ABSORBABLE 12-7 MM EX MISC
CUTANEOUS | Status: DC | PRN
  Administered 2016-11-18: 1 via TOPICAL

## 2016-11-18 MED ORDER — FENTANYL CITRATE (PF) 100 MCG/2ML IJ SOLN
INTRAMUSCULAR | Status: AC
  Filled 2016-11-18: qty 4

## 2016-11-18 MED ORDER — THROMBIN 20000 UNITS EX SOLR
CUTANEOUS | Status: AC
  Filled 2016-11-18: qty 20000

## 2016-11-18 MED ORDER — DEXAMETHASONE SODIUM PHOSPHATE 10 MG/ML IJ SOLN
INTRAMUSCULAR | Status: DC | PRN
  Administered 2016-11-18: 10 mg via INTRAVENOUS

## 2016-11-18 MED ORDER — LIDOCAINE HCL (CARDIAC) 20 MG/ML IV SOLN
INTRAVENOUS | Status: DC | PRN
  Administered 2016-11-18: 80 mg via INTRAVENOUS
  Administered 2016-11-18: 20 mg via INTRAVENOUS

## 2016-11-18 MED ORDER — ROCURONIUM BROMIDE 100 MG/10ML IV SOLN
INTRAVENOUS | Status: DC | PRN
  Administered 2016-11-18: 50 mg via INTRAVENOUS

## 2016-11-18 MED ORDER — PROPOFOL 10 MG/ML IV BOLUS
INTRAVENOUS | Status: AC
  Filled 2016-11-18: qty 40

## 2016-11-18 MED ORDER — SODIUM CHLORIDE 0.9 % IV SOLN: 250.0000 mL | INTRAVENOUS | Status: DC

## 2016-11-18 MED ORDER — ROCURONIUM BROMIDE 50 MG/5ML IV SOSY
PREFILLED_SYRINGE | INTRAVENOUS | Status: AC
  Filled 2016-11-18: qty 5

## 2016-11-18 MED ORDER — SUCCINYLCHOLINE CHLORIDE 200 MG/10ML IV SOSY
PREFILLED_SYRINGE | INTRAVENOUS | Status: AC
  Filled 2016-11-18: qty 20

## 2016-11-18 MED ORDER — LACTATED RINGERS IV SOLN
INTRAVENOUS | Status: DC | PRN
  Administered 2016-11-18 (×3): via INTRAVENOUS

## 2016-11-18 MED ORDER — CEFAZOLIN IN D5W 1 GM/50ML IV SOLN
1.0000 g | Freq: Three times a day (TID) | INTRAVENOUS | Status: AC
  Administered 2016-11-18 (×2): 1 g via INTRAVENOUS
  Filled 2016-11-18 (×2): qty 50

## 2016-11-18 MED ORDER — SODIUM CHLORIDE 0.9% FLUSH
3.0000 mL | Freq: Two times a day (BID) | INTRAVENOUS | Status: DC
  Administered 2016-11-18 – 2016-11-19 (×2): 3 mL via INTRAVENOUS

## 2016-11-18 MED ORDER — PHENYLEPHRINE 40 MCG/ML (10ML) SYRINGE FOR IV PUSH (FOR BLOOD PRESSURE SUPPORT)
PREFILLED_SYRINGE | INTRAVENOUS | Status: AC
  Filled 2016-11-18: qty 10

## 2016-11-18 MED ORDER — PROPOFOL 10 MG/ML IV BOLUS
INTRAVENOUS | Status: AC
  Filled 2016-11-18: qty 20

## 2016-11-18 MED ORDER — FENTANYL CITRATE (PF) 100 MCG/2ML IJ SOLN
INTRAMUSCULAR | Status: DC | PRN
  Administered 2016-11-18: 150 ug via INTRAVENOUS
  Administered 2016-11-18: 100 ug via INTRAVENOUS
  Administered 2016-11-18 (×3): 50 ug via INTRAVENOUS

## 2016-11-18 MED ORDER — SUCCINYLCHOLINE CHLORIDE 20 MG/ML IJ SOLN
INTRAMUSCULAR | Status: DC | PRN
  Administered 2016-11-18: 140 mg via INTRAVENOUS

## 2016-11-18 MED ORDER — MIDAZOLAM HCL 2 MG/2ML IJ SOLN: 0.5000 mg | Freq: Once | INTRAMUSCULAR | Status: DC | PRN

## 2016-11-18 MED ORDER — MIDAZOLAM HCL 5 MG/5ML IJ SOLN
INTRAMUSCULAR | Status: DC | PRN
  Administered 2016-11-18: 2 mg via INTRAVENOUS

## 2016-11-18 MED ORDER — SUGAMMADEX SODIUM 200 MG/2ML IV SOLN
INTRAVENOUS | Status: DC | PRN
  Administered 2016-11-18: 200 mg via INTRAVENOUS

## 2016-11-18 MED ORDER — PROPOFOL 500 MG/50ML IV EMUL
INTRAVENOUS | Status: DC | PRN
  Administered 2016-11-18: 75 ug/kg/min via INTRAVENOUS

## 2016-11-18 MED ORDER — SCOPOLAMINE 1 MG/3DAYS TD PT72
MEDICATED_PATCH | TRANSDERMAL | Status: AC
  Filled 2016-11-18: qty 1

## 2016-11-18 MED ORDER — PROMETHAZINE HCL 25 MG/ML IJ SOLN: 6.2500 mg | INTRAMUSCULAR | Status: DC | PRN

## 2016-11-18 MED ORDER — PHENYLEPHRINE HCL 10 MG/ML IJ SOLN
INTRAMUSCULAR | Status: DC | PRN
  Administered 2016-11-18: 80 ug via INTRAVENOUS

## 2016-11-18 MED ORDER — BUPIVACAINE-EPINEPHRINE (PF) 0.25% -1:200000 IJ SOLN
INTRAMUSCULAR | Status: AC
  Filled 2016-11-18: qty 30

## 2016-11-18 MED ORDER — DEXTROSE 5 % IV SOLN
3.0000 g | Freq: Once | INTRAVENOUS | Status: DC
  Filled 2016-11-18: qty 3000

## 2016-11-18 MED ORDER — SUGAMMADEX SODIUM 200 MG/2ML IV SOLN
INTRAVENOUS | Status: AC
  Filled 2016-11-18: qty 2

## 2016-11-18 MED ORDER — ACETAMINOPHEN 10 MG/ML IV SOLN
INTRAVENOUS | Status: AC
  Filled 2016-11-18: qty 100

## 2016-11-18 MED ORDER — SCOPOLAMINE 1 MG/3DAYS TD PT72
MEDICATED_PATCH | TRANSDERMAL | Status: DC | PRN
  Administered 2016-11-18: 1.5 mg via TRANSDERMAL

## 2016-11-18 MED ORDER — LIDOCAINE 2% (20 MG/ML) 5 ML SYRINGE
INTRAMUSCULAR | Status: AC
  Filled 2016-11-18: qty 10

## 2016-11-18 MED ORDER — MIDAZOLAM HCL 2 MG/2ML IJ SOLN
INTRAMUSCULAR | Status: AC
  Filled 2016-11-18: qty 2

## 2016-11-18 MED ORDER — ARTIFICIAL TEARS OP OINT
TOPICAL_OINTMENT | OPHTHALMIC | Status: AC
  Filled 2016-11-18: qty 7

## 2016-11-18 MED ORDER — KETAMINE HCL 10 MG/ML IJ SOLN
INTRAMUSCULAR | Status: DC | PRN
  Administered 2016-11-18 (×5): 20 mg via INTRAVENOUS

## 2016-11-18 MED ORDER — BUPIVACAINE LIPOSOME 1.3 % IJ SUSP
20.0000 mL | INTRAMUSCULAR | Status: AC
  Administered 2016-11-18: 20 mL
  Filled 2016-11-18: qty 20

## 2016-11-18 MED ORDER — EPHEDRINE 5 MG/ML INJ
INTRAVENOUS | Status: AC
  Filled 2016-11-18: qty 10

## 2016-11-18 MED ORDER — KETAMINE HCL-SODIUM CHLORIDE 100-0.9 MG/10ML-% IV SOSY
PREFILLED_SYRINGE | INTRAVENOUS | Status: AC
  Filled 2016-11-18: qty 10

## 2016-11-18 MED ORDER — SODIUM CHLORIDE 0.9% FLUSH: 3.0000 mL | INTRAVENOUS | Status: DC | PRN

## 2016-11-18 MED ORDER — ALBUMIN HUMAN 5 % IV SOLN
INTRAVENOUS | Status: DC | PRN
  Administered 2016-11-18: 10:00:00 via INTRAVENOUS

## 2016-11-18 MED ORDER — MEPERIDINE HCL 25 MG/ML IJ SOLN: 6.2500 mg | INTRAMUSCULAR | Status: DC | PRN

## 2016-11-18 MED ORDER — DEXTROSE 5 % IV SOLN
INTRAVENOUS | Status: DC | PRN
  Administered 2016-11-18: 3 g via INTRAVENOUS

## 2016-11-18 MED ORDER — ONDANSETRON HCL 4 MG/2ML IJ SOLN
INTRAMUSCULAR | Status: AC
  Filled 2016-11-18: qty 2

## 2016-11-18 MED ORDER — PHENYLEPHRINE HCL 10 MG/ML IJ SOLN
INTRAVENOUS | Status: DC | PRN
  Administered 2016-11-18: 10 ug/min via INTRAVENOUS

## 2016-11-18 MED ORDER — ONDANSETRON HCL 4 MG/2ML IJ SOLN
INTRAMUSCULAR | Status: DC | PRN
  Administered 2016-11-18: 4 mg via INTRAVENOUS

## 2016-11-18 MED ORDER — LIDOCAINE 2% (20 MG/ML) 5 ML SYRINGE
INTRAMUSCULAR | Status: AC
  Filled 2016-11-18: qty 5

## 2016-11-18 MED FILL — Heparin Sodium (Porcine) Inj 1000 Unit/ML: INTRAMUSCULAR | Qty: 30 | Status: AC

## 2016-11-18 MED FILL — Sodium Chloride IV Soln 0.9%: INTRAVENOUS | Qty: 1000 | Status: AC

## 2016-11-18 SURGICAL SUPPLY — 97 items
APL SKNCLS STERI-STRIP NONHPOA (GAUZE/BANDAGES/DRESSINGS) ×1
BENZOIN TINCTURE PRP APPL 2/3 (GAUZE/BANDAGES/DRESSINGS) ×3 IMPLANT
BLADE SURG ROTATE 9660 (MISCELLANEOUS) ×2 IMPLANT
BUR PRESCISION 1.7 ELITE (BURR) ×3 IMPLANT
BUR ROUND PRECISION 4.0 (BURR) IMPLANT
BUR ROUND PRECISION 4.0MM (BURR)
BUR SABER RD CUTTING 3.0 (BURR) IMPLANT
BUR SABER RD CUTTING 3.0MM (BURR)
CARTRIDGE OIL MAESTRO DRILL (MISCELLANEOUS) ×1 IMPLANT
CLOSURE STERI-STRIP 1/2X4 (GAUZE/BANDAGES/DRESSINGS) ×1
CLOSURE WOUND 1/2 X4 (GAUZE/BANDAGES/DRESSINGS) ×2
CLSR STERI-STRIP ANTIMIC 1/2X4 (GAUZE/BANDAGES/DRESSINGS) ×1 IMPLANT
CONT SPEC STER OR (MISCELLANEOUS) ×3 IMPLANT
COVER MAYO STAND STRL (DRAPES) ×4 IMPLANT
COVER SURGICAL LIGHT HANDLE (MISCELLANEOUS) ×3 IMPLANT
DIFFUSER DRILL AIR PNEUMATIC (MISCELLANEOUS) ×3 IMPLANT
DRAIN CHANNEL 15F RND FF W/TCR (WOUND CARE) IMPLANT
DRAPE C-ARM 42X72 X-RAY (DRAPES) ×3 IMPLANT
DRAPE C-ARMOR (DRAPES) ×2 IMPLANT
DRAPE POUCH INSTRU U-SHP 10X18 (DRAPES) ×3 IMPLANT
DRAPE SURG 17X23 STRL (DRAPES) ×12 IMPLANT
DURAPREP 26ML APPLICATOR (WOUND CARE) ×3 IMPLANT
ELECT BLADE 4.0 EZ CLEAN MEGAD (MISCELLANEOUS) ×3
ELECT CAUTERY BLADE 6.4 (BLADE) ×3 IMPLANT
ELECT REM PT RETURN 9FT ADLT (ELECTROSURGICAL) ×3
ELECTRODE BLDE 4.0 EZ CLN MEGD (MISCELLANEOUS) ×1 IMPLANT
ELECTRODE REM PT RTRN 9FT ADLT (ELECTROSURGICAL) ×1 IMPLANT
EVACUATOR SILICONE 100CC (DRAIN) IMPLANT
FEE INTRAOP MONITOR IMPULS NCS (MISCELLANEOUS) ×1 IMPLANT
GAUZE SPONGE 4X4 12PLY STRL (GAUZE/BANDAGES/DRESSINGS) ×3 IMPLANT
GAUZE SPONGE 4X4 16PLY XRAY LF (GAUZE/BANDAGES/DRESSINGS) ×1 IMPLANT
GLOVE BIO SURGEON STRL SZ7 (GLOVE) ×3 IMPLANT
GLOVE BIO SURGEON STRL SZ8 (GLOVE) ×3 IMPLANT
GLOVE BIOGEL PI IND STRL 6.5 (GLOVE) ×1 IMPLANT
GLOVE BIOGEL PI IND STRL 7.0 (GLOVE) ×1 IMPLANT
GLOVE BIOGEL PI IND STRL 8 (GLOVE) ×1 IMPLANT
GLOVE BIOGEL PI INDICATOR 6.5 (GLOVE) ×2
GLOVE BIOGEL PI INDICATOR 7.0 (GLOVE) ×2
GLOVE BIOGEL PI INDICATOR 8 (GLOVE) ×2
GLOVE SURG SS PI 6.5 STRL IVOR (GLOVE) ×2 IMPLANT
GOWN STRL REUS W/ TWL LRG LVL3 (GOWN DISPOSABLE) ×2 IMPLANT
GOWN STRL REUS W/ TWL XL LVL3 (GOWN DISPOSABLE) ×1 IMPLANT
GOWN STRL REUS W/TWL LRG LVL3 (GOWN DISPOSABLE) ×9
GOWN STRL REUS W/TWL XL LVL3 (GOWN DISPOSABLE) ×3
GUIDEWIRE BLUNT VIPER II 1.45 (WIRE) ×2 IMPLANT
GUIDEWIRE SHARP VIPER II (WIRE) ×4 IMPLANT
INTRAOP MONITOR FEE IMPULS NCS (MISCELLANEOUS) ×1
INTRAOP MONITOR FEE IMPULSE (MISCELLANEOUS) ×2
IV CATH 14GX2 1/4 (CATHETERS) ×3 IMPLANT
KIT BASIN OR (CUSTOM PROCEDURE TRAY) ×3 IMPLANT
KIT POSITION SURG JACKSON T1 (MISCELLANEOUS) ×3 IMPLANT
KIT ROOM TURNOVER OR (KITS) ×3 IMPLANT
MARKER SKIN DUAL TIP RULER LAB (MISCELLANEOUS) ×3 IMPLANT
NDL HYPO 25GX1X1/2 BEV (NEEDLE) ×1 IMPLANT
NDL SAFETY ECLIPSE 18X1.5 (NEEDLE) ×1 IMPLANT
NEEDLE 22X1 1/2 (OR ONLY) (NEEDLE) ×3 IMPLANT
NEEDLE HYPO 18GX1.5 SHARP (NEEDLE)
NEEDLE HYPO 25GX1X1/2 BEV (NEEDLE) ×3 IMPLANT
NEEDLE JAMSHIDI VIPER (NEEDLE) ×6 IMPLANT
NEEDLE SPNL 18GX3.5 QUINCKE PK (NEEDLE) ×6 IMPLANT
NS IRRIG 1000ML POUR BTL (IV SOLUTION) ×3 IMPLANT
OIL CARTRIDGE MAESTRO DRILL (MISCELLANEOUS) ×3
PACK LAMINECTOMY ORTHO (CUSTOM PROCEDURE TRAY) ×3 IMPLANT
PACK UNIVERSAL I (CUSTOM PROCEDURE TRAY) ×3 IMPLANT
PAD ARMBOARD 7.5X6 YLW CONV (MISCELLANEOUS) ×6 IMPLANT
PATTIES SURGICAL .5 X1 (DISPOSABLE) ×3 IMPLANT
PATTIES SURGICAL .5X1.5 (GAUZE/BANDAGES/DRESSINGS) ×3 IMPLANT
PROBE PEDCLE PROBE MAGSTM DISP (MISCELLANEOUS) ×3 IMPLANT
PUTTY BONE DBX 2.5 MIS (Bone Implant) ×3 IMPLANT
ROD VIPER 2 PRE-LORDOSED 80MM (Rod) ×4 IMPLANT
SCREW EXPEDIUM 5.5 8X40 (Screw) ×4 IMPLANT
SCREW SET SINGLE INNER MIS (Screw) ×12 IMPLANT
SCREW XTAB POLY VIPER  7X45 (Screw) ×4 IMPLANT
SCREW XTAB POLY VIPER 7X45 (Screw) IMPLANT
SPONGE INTESTINAL PEANUT (DISPOSABLE) ×3 IMPLANT
SPONGE SURGIFOAM ABS GEL 100 (HEMOSTASIS) ×3 IMPLANT
STRIP CLOSURE SKIN 1/2X4 (GAUZE/BANDAGES/DRESSINGS) ×4 IMPLANT
SURGIFLO W/THROMBIN 8M KIT (HEMOSTASIS) IMPLANT
SUT BONE WAX W31G (SUTURE) ×2 IMPLANT
SUT MNCRL AB 4-0 PS2 18 (SUTURE) ×3 IMPLANT
SUT VIC AB 0 CT1 18XCR BRD 8 (SUTURE) ×1 IMPLANT
SUT VIC AB 0 CT1 8-18 (SUTURE) ×6
SUT VIC AB 1 CT1 18XCR BRD 8 (SUTURE) ×1 IMPLANT
SUT VIC AB 1 CT1 8-18 (SUTURE) ×6
SUT VIC AB 2-0 CT2 18 VCP726D (SUTURE) ×3 IMPLANT
SYR 20CC LL (SYRINGE) ×3 IMPLANT
SYR BULB IRRIGATION 50ML (SYRINGE) ×3 IMPLANT
SYR CONTROL 10ML LL (SYRINGE) ×6 IMPLANT
SYR TB 1ML LUER SLIP (SYRINGE) ×1 IMPLANT
TAP CANN VIPER2 DL 6.0 (TAP) ×4 IMPLANT
TAP CANN VIPER2 DL 7.0 (TAP) ×4 IMPLANT
TAPE CLOTH SURG 6X10 WHT LF (GAUZE/BANDAGES/DRESSINGS) ×2 IMPLANT
TOWEL OR 17X24 6PK STRL BLUE (TOWEL DISPOSABLE) ×3 IMPLANT
TOWEL OR 17X26 10 PK STRL BLUE (TOWEL DISPOSABLE) ×3 IMPLANT
TRAY FOLEY CATH 16FRSI W/METER (SET/KITS/TRAYS/PACK) ×1 IMPLANT
WATER STERILE IRR 1000ML POUR (IV SOLUTION) ×1 IMPLANT
YANKAUER SUCT BULB TIP NO VENT (SUCTIONS) ×3 IMPLANT

## 2016-11-18 NOTE — H&P (Signed)
Patient is s/p stage 1 of 2, doing well. Will proceed with stage 2 of his 2-staged procedure.

## 2016-11-18 NOTE — Transfer of Care (Signed)
Immediate Anesthesia Transfer of Care Note  Patient: Bryan Harvey  Procedure(s) Performed: Procedure(s) with comments: LUMBAR 3-4 POSTERIOR SPINAL FUSION WITH INSTRUMENTATION AND ALLOGRAFT, removal of hardware and extention of fusion (N/A) - LUMBAR 3-4 POSTERIOR SPINAL FUSION WITH INSTRUMENTATION AND ALLOGRAFT  Patient Location: PACU  Anesthesia Type:General  Level of Consciousness: awake, sedated, patient cooperative and responds to stimulation  Airway & Oxygen Therapy: Patient Spontanous Breathing and Patient connected to face mask oxygen  Post-op Assessment: Report given to RN, Post -op Vital signs reviewed and stable, Patient moving all extremities and Patient moving all extremities X 4  Post vital signs: Reviewed and stable  Last Vitals:  Vitals:   11/18/16 0010 11/18/16 0449  BP: 126/72 140/79  Pulse: 85 88  Resp: 18 18  Temp: 36.9 C 36.7 C    Last Pain:  Vitals:   11/18/16 0520  TempSrc:   PainSc: 8       Patients Stated Pain Goal: 2 (11/17/16 0725)  Complications: No apparent anesthesia complications

## 2016-11-18 NOTE — Anesthesia Postprocedure Evaluation (Signed)
Anesthesia Post Note  Patient: Bryan Harvey  Procedure(s) Performed: Procedure(s) (LRB): LUMBAR 3-4 POSTERIOR SPINAL FUSION WITH INSTRUMENTATION AND ALLOGRAFT, removal of hardware and extention of fusion (N/A)  Patient location during evaluation: PACU Anesthesia Type: General Level of consciousness: awake and alert Pain management: pain level controlled Vital Signs Assessment: post-procedure vital signs reviewed and stable Respiratory status: spontaneous breathing, nonlabored ventilation and respiratory function stable Cardiovascular status: blood pressure returned to baseline and stable Postop Assessment: no signs of nausea or vomiting Anesthetic complications: no       Last Vitals:  Vitals:   11/18/16 1229 11/18/16 1300  BP: (!) 134/97 (!) 133/97  Pulse: 84 94  Resp: 15 16  Temp: 36.7 C 36.5 C    Last Pain:  Vitals:   11/18/16 1300  TempSrc: Oral  PainSc:                  [name],[name]

## 2016-11-18 NOTE — Progress Notes (Signed)
Orthopedic Tech Progress Note Patient Details:  Bryan RudChristopher D Harvey 08/24/1966 [id number]  Patient ID: Bryan Rudhristopher D Harvey, male   DOB: 02/01/1966, 50 y.o.   MRN: [id number]   Bryan Harvey, Bryan Harvey 11/18/2016, 1:25 PM Called in bio-tech brace order; spoke with Judeth CornfieldStephanie

## 2016-11-18 NOTE — Op Note (Signed)
NAMTrinna Harvey:  Harvey, Bryan         ACCOUNT NO.:  [id number]  MEDICAL RECORD NO.:  [id number]  LOCATION:                                 FACILITY:  PHYSICIAN:  Estill BambergMark Aliahna Statzer, MD      DATE OF BIRTH:  1966/03/22  DATE OF PROCEDURE:  11/17/2016                              OPERATIVE REPORT   PREOPERATIVE DIAGNOSES: 1. Left-sided L4 radiculopathy. 2. 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  POSTOPERATIVE DIAGNOSES: 1. Left-sided L4 radiculopathy. 2. 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  PROCEDURES (stage 1 of 2) 1. L3-4 anterior lumbar interbody fusion via a direct     right-sided lateral approach. 2. Insertion of interbody device x1 (12 x 55 x 22 mm NuVasive     intervertebral spacer). 3. Placement of anterior instrumentation, L3-4. 4. Use of morselized allograft-DBX mix. 5. Intraoperative use of fluoroscopy.  SURGEON:  Estill BambergMark Nikitha Mode, MD.  ASSISTANJason Harvey:  Bryan McKenzie, PA-C.  ANESTHESIA:  General endotracheal anesthesia.  COMPLICATIONS:  None.  DISPOSITION:  Stable.  ESTIMATED BLOOD LOSS:  Minimal.  INDICATIONS FOR SURGERY:  Briefly, [name] is a very pleasant, 50- year-old male, who is status post a previous L5-S1 fusion, which was subsequently followed by an L4-5 fusion.  The patient did very well from those surgeries, but more recently developed severe debilitating pain in the left leg.  An MRI did reveal adjacent segment degeneration associated with the L3-4 level with an obvious left L3-4 disk herniation, compressing the left L4 nerve.  The patient's pain was initially intermittent, but the pain did become constant over time.  The patient did fail appropriate forms of conservative care, and we did discuss proceeding with the procedure reflected above.  The patient did understand that the procedure above was stage I of what was to be a 2- staged procedure.  The patient  was fully aware of the risks and limitations of surgery and did elect to proceed.  He did understand that the plan was to proceed with stage II on the following day, which would involve a posterior fusion with instrumentation and potentially an L3-4 decompression, depending on his symptomatology.  OPERATIVE DETAILS:  On November 17, 2016, the patient was brought to surgery and general endotracheal anesthesia was administered.  The patient was placed in the lateral decubitus position with the right side up.  All bony prominences were meticulously padded.  The hips and knees were appropriately flexed.  The patient was secured to the bed.  The right flank was then prepped and draped in the usual sterile fashion.  A time-out procedure was performed.  I then made a transverse incision overlying the L3-4 intervertebral space.  The external and internal oblique musculature as well as transversalis musculature and the fascia were dissected.  The retroperitoneal space was encountered.  The peritoneum was bluntly swept anteriorly.  I did place the initial dilator through the psoas using neurologic monitoring.  There were no neurologic structures in the immediate vicinity of the initial dilator. I then sequentially dilated and placed a self-retaining retractor. Again, this was placed through the psoas and docked on  the right side of the lateral aspect of the L3-4 intervertebral space.  I did again use neurologic monitoring to explore the region of the exposed intervertebral disk, and there were no neurologic structures in the vicinity of the intervertebral disk space.  I then proceeded with a thorough and complete L3-4 intervertebral diskectomy.  I was very pleased with the diskectomy that I was able to accomplish.  The endplates were then prepared.  The appropriate size interbody spacer was then packed with DBX mix and tamped into position in the usual fashion. I was very pleased with the  press-fit of the spacer.  I then proceeded with the anterior instrumentation portion of the procedure.  A 2-hole plate was placed over the lateral aspect of the spine.  55 mm screws were then placed through the L3 and L4 vertebral bodies and secured to the plate.  The screws were then locked to the plate using the locking mechanism.  The table was then flattened, and the plate was locked.  I was extremely pleased with the AP and lateral fluoroscopic images.  I did use fluoroscopy throughout the procedure.  The wound was then copiously irrigated.  The wound was then closed in layers using 0 Vicryl, followed by 2-0 Vicryl, followed by 3-0 Monocryl.  Benzoin and Steri-Strips were applied followed by sterile dressing.  All instrument counts were correct at the termination of the procedure.  Of note, there was no abnormal-sustained EMG activity throughout the entire surgery.  Of note, Bryan Harvey was my assistant throughout surgery and did aid in retraction, suctioning, and closure from start to finish.     Estill BambergMark Ericah Scotto, MD   ______________________________ Estill BambergMark Maylen Waltermire, MD    MD/MEDQ  D:  11/17/2016  T:  11/18/2016  Job:  [id number]

## 2016-11-18 NOTE — Anesthesia Procedure Notes (Signed)
Procedure Name: Intubation Date/Time: 11/18/2016 7:36 AM Performed by: Wray KearnsFOLEY, Bryan Harvey Pre-anesthesia Checklist: Patient identified, Emergency Drugs available, Suction available and Patient being monitored Patient Re-evaluated:Patient Re-evaluated prior to inductionOxygen Delivery Method: Circle System Utilized and Circle system utilized Preoxygenation: Pre-oxygenation with 100% oxygen Intubation Type: IV induction Ventilation: Mask ventilation without difficulty Laryngoscope Size: Mac and 4 Grade View: Grade II Tube type: Oral Tube size: 7.5 mm Number of attempts: 1 Airway Equipment and Method: Stylet Placement Confirmation: ETT inserted through vocal cords under direct vision,  positive ETCO2 and breath sounds checked- equal and bilateral Secured at: 23 cm Tube secured with: Tape Dental Injury: Teeth and Oropharynx as per pre-operative assessment

## 2016-11-19 ENCOUNTER — Encounter (HOSPITAL_COMMUNITY): Payer: Self-pay | Admitting: Orthopedic Surgery

## 2016-11-19 MED FILL — Thrombin For Soln 20000 Unit: CUTANEOUS | Qty: 1 | Status: AC

## 2016-11-19 NOTE — Progress Notes (Signed)
    Patient doing excellent Patient denies leg pain Minimal low back pain Has been ambulating   Physical Exam: Vitals:   11/19/16 0017 11/19/16 0420  BP: 125/78 117/76  Pulse: 96 80  Resp: 17 16  Temp: 98.6 F (37 C) 97.6 F (36.4 C)   Patient looks excellent Dressing in place NVI  POD #1 s/p L3/4 PSF and POD #2 after lateral L3/4 fusion, doing well   - up with PT/OT, encourage ambulation - Percocet for pain, Valium for muscle spasms - likely d/c home today with follow-up in 2 weeks

## 2016-11-19 NOTE — Op Note (Signed)
NAMTrinna Harvey:  Harvey Harvey         ACCOUNT NO.:  [id number]  MEDICAL RECORD NO.:  [id number]  LOCATION:                                 FACILITY:  PHYSICIAN:  Estill BambergMark Donte Kary, MD      DATE OF BIRTH:  November 21, 1966  DATE OF PROCEDURE:  11/18/2016                              OPERATIVE REPORT   PREOPERATIVE DIAGNOSIS:  Status post previous anterior lumbar interbody fusion, L3-4, requiring posterior fusion with instrumentation.  POSTOPERATIVE DIAGNOSIS:  Status post previous anterior lumbar interbody fusion, L3-4, requiring posterior fusion with instrumentation.  PROCEDURE: (stage 2 of 2) 1. Removal and re-insertion of spinal fixation, bilaterally, L4. 2. Placement of new segmental instrumentation, bilaterally, L3 (this     instrumentation was connected to the patient's L4 and L5     instrumentation). 3. Posterior spinal fusion, L3-4. 4. Use of morselized allograft - DBX mix. 5. Intraoperative use of fluoroscopy.  SURGEON:  Estill BambergMark Bostyn Kunkler, MD  ASSISTANT:  Jason CoopKayla McKenzie, PA-C.  ANESTHESIA:  General endotracheal anesthesia.  COMPLICATIONS:  None.  DISPOSITION:  Stable.  ESTIMATED BLOOD LOSS:  Minimal.  INDICATIONS FOR SURGERY:  Briefly, Mr. Harvey Harvey is a very pleasant 50- year-old male who is status post the procedure outlined above.  The patient did present for stage II of what was to be a two-staged procedure.  Please refer to my operative report dated November 17, 2016, for a full account of the patient's history and indications for surgery.  OPERATIVE DETAILS:  On November 18, 2016, the patient was brought to Surgery and general endotracheal anesthesia was administered.  The patient was placed prone on a well-padded flat Jackson bed with a spinal frame.  The back was prepped and draped and a time-out procedure was performed.  I then made 2 paramedian incisions spanning approximately L3- L5 bilaterally.  A Wiltse approach was used.  The previously placed L4 and L5  instrumentation was identified.  The caps were removed as was the interconnecting rod.  At L4, I did remove the previously placed instrumentation.  New instrumentation was then placed into L4. Specifically, I did upsize the screws from 7 x 45 mm screws to 8 x 45 mm screws.  This was done to add to the stability of the construct.  I then cannulated the L3 pedicles bilaterally using Jamshidi and guidewires.  I did tap up to a 6 mm tap bilaterally.  I did liberally use AP and lateral fluoroscopy.  I did use triggered EMG to test the taps bilaterally, and there was no tap that tested below 20 milliamps.  I then passed an 80 mm rod bilaterally, to span L3-L5.  Caps were then placed at L3, L4, and L5.  They were provisionally tightened and a final locking procedure was performed.  I then identified the posterolateral gutter on the left side at L3-4 in addition to the left L3-4 facet joint.  A high-speed bur was utilized to decorticate the left-sided L3-4 facet joint and left posterolateral gutter.  Abundant allograft in the form of DBX mix was packed into the facet joint and posterolateral gutter for the posterior spinal fusion portion of the procedure.  I was very pleased with the final AP and lateral fluoroscopic images.  The wound was then copiously irrigated.  The wound was then closed in layers using #1 Vicryl followed by 2-0 Vicryl followed by 4-0 Monocryl. Benzoin and Steri-Strips were applied followed by sterile dressing.  All instrument counts were correct at the termination of the procedure.  Of note, Jason CoopKayla McKenzie was my assistant throughout surgery, and did aid in retraction, suctioning, and closure from start to finish.     Estill BambergMark Keisean Skowron, MD   ______________________________ Estill BambergMark Azion Centrella, MD    MD/MEDQ  D:  11/18/2016  T:  11/18/2016  Job:  [id number]

## 2016-11-19 NOTE — Evaluation (Addendum)
Physical Therapy Evaluation Patient Details Name: Bryan Harvey MRN: [id number] DOB: 12/26/1965 Today's Date: 11/19/2016   History of Present Illness  Pt is a 50 y.o. male s/p R sided lumbar 3-4 lateral interbody fusion with instrumentation and allograft as well as removal of hardware and extension of fusion. Pt has a PMH of back pain with radiculopathy, childhood asthma, lumbar disc disease, gastroesophageal reflux, GERD, hiatal hernia, hyperlipidemia, post-operative nausea and vomiting.  Clinical Impression  Pt presents POD 2 from lateral fusion and POD 1 from posterior fusion to L3-L4 region. Prior to admission, pt was completely independent with all ADLs and IADLs and worked full time. Pt lives in a two story home with his wife but will be able to sleep on first level and has a full bath available to him. Pt is assisted with donning brace and is able to perform with Min A. Pt is able to perform gait with supervision without an AD with no increased c/o pain noted. Back precautions hand out was reviewed and no further instruction is required at this time. Pt may benefit form additional therapy once cleared by MD but will need no further acute follow up.     Follow Up Recommendations Outpatient PT;Other (comment) (once cleared by MD)    Equipment Recommendations  None recommended by PT    Recommendations for Other Services       Precautions / Restrictions Precautions Precautions: Back Precaution Booklet Issued: Yes (comment) Precaution Comments: Handout given and reviewed Required Braces or Orthoses: Spinal Brace Spinal Brace: Applied in sitting position;Applied in standing position;Thoracolumbosacral orthotic      Mobility  Bed Mobility Overal bed mobility: Independent             General bed mobility comments: able to get to EOB wtihout assistance  Transfers Overall transfer level: Modified independent Equipment used: Rolling walker (2 wheeled)              General transfer comment: Mod I for sit to stand transfer from EOB,   Ambulation/Gait Ambulation/Gait assistance: Supervision Ambulation Distance (Feet): 500 Feet Assistive device: None Gait Pattern/deviations: Step-through pattern;Narrow base of support Gait velocity: decreased Gait velocity interpretation: Below normal speed for age/gender General Gait Details: decreased cadence and step length bilaterally, mild antalgia noted.   Stairs Stairs: Yes Stairs assistance: Supervision Stair Management: No rails;Forwards Number of Stairs: 2 General stair comments: Supervision for safety due to IV pole  Wheelchair Mobility    Modified Rankin (Stroke Patients Only)       Balance Overall balance assessment: No apparent balance deficits (not formally assessed)                                           Pertinent Vitals/Pain Pain Assessment: Faces Faces Pain Scale: Hurts a little bit Pain Location: low back Pain Descriptors / Indicators: Guarding Pain Intervention(s): Limited activity within patient's tolerance;Monitored during session;Premedicated before session    Home Living Family/patient expects to be discharged to:: Private residence Living Arrangements: Spouse/significant other;Other (Comment) (2 large dogs, 650mo and 50mo) Available Help at Discharge: Family;Available PRN/intermittently Type of Home: House Home Access: Stairs to enter Entrance Stairs-Rails: None Entrance Stairs-Number of Steps: 2 Home Layout: Two level;Able to live on main level with bedroom/bathroom;Full bath on main level Home Equipment: Cane - single point      Prior Function Level of Independence: Independent  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, completely independent     Hand Dominance   Dominant Hand: Right    Extremity/Trunk Assessment   Upper Extremity Assessment Upper Extremity Assessment: Defer to OT evaluation    Lower Extremity  Assessment Lower Extremity Assessment: Overall WFL for tasks assessed    Cervical / Trunk Assessment Cervical / Trunk Assessment: Normal  Communication   Communication: No difficulties  Cognition Arousal/Alertness: Awake/alert Behavior During Therapy: WFL for tasks assessed/performed Overall Cognitive Status: Within Functional Limits for tasks assessed                      General Comments      Exercises     Assessment/Plan    PT Assessment Patent does not need any further PT services  PT Problem List            PT Treatment Interventions      PT Goals (Current goals can be found in the Care Plan section)  Acute Rehab PT Goals Patient Stated Goal: to get home today PT Goal Formulation: With patient    Frequency     Barriers to discharge        Co-evaluation               End of Session Equipment Utilized During Treatment: Back brace Activity Tolerance: Patient tolerated treatment well Patient left: in chair;with call bell/phone within reach;Other (comment) (OT present to perform own eval) Nurse Communication: Mobility status         Time: [id number] PT Time Calculation (min) (ACUTE ONLY): 27 min   Charges:   PT Evaluation $PT Eval Low Complexity: 1 Procedure PT Treatments $Gait Training: 8-22 mins   PT G Codes:        Colin BroachSabra M. Iliany Losier PT, DPT  [phone]  11/19/2016, 10:59 AM

## 2016-11-19 NOTE — Evaluation (Signed)
Occupational Therapy Evaluation/Discharge Patient Details Name: Bryan RudChristopher D Chicas MRN: [id number] DOB: 02/08/1966 Today's Date: 11/19/2016    History of Present Illness Pt is a 50 y.o. male s/p R sided lumbar 3-4 lateral interbody fusion with instrumentation and allograft as well as removal of hardware and extension of fusion. Pt has a PMH of back pain with radiculopathy, childhood asthma, lumbar disc disease, gastroesophageal reflux, GERD, hiatal hernia, hyperlipidemia, post-operative nausea and vomiting.   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 Pt currently requires supervision for functional toilet and shower transfers and min guard assist for LB ADL with adaptive equipment. He plans to D/C home with intermittent assistance from wife. All education complete concerning fall prevention, brace wear schedule, use of AE for LB ADL, use of 3-in-1 to improve adherence to back precautions during toilet transfers, and compensatory techniques for ADL. Pt verbalizes and demonstrates understanding of all topics. He reports that he will have wife measure room around commode at home to see if 3-in-1 will fit. Pt reports no further questions/concerns and has no further acute OT needs. OT will sign off.    Follow Up Recommendations  No OT follow up;Supervision - Intermittent    Equipment Recommendations  3 in 1 bedside commode    Recommendations for Other Services       Precautions / Restrictions Precautions Precautions: Back Precaution Booklet Issued: Yes (comment) Precaution Comments: Reviewed handout provided by OT Required Braces or Orthoses: Spinal Brace Spinal Brace: Applied in sitting position;Applied in standing position;Thoracolumbosacral orthotic      Mobility Bed Mobility Overal bed mobility: Independent             General bed mobility comments: OOB on arrival but able to verbalize technique reviewed with  PT.  Transfers Overall transfer level: Modified independent Equipment used: None             General transfer comment: Mod I for sit to stand transfer from EOB,     Balance Overall balance assessment: No apparent balance deficits (not formally assessed)                                          ADL Overall ADL's : Needs assistance/impaired     Grooming: Supervision/safety;Standing   Upper Body Bathing: Supervision/ safety;Sitting   Lower Body Bathing: Supervison/ safety;Sit to/from stand;With adaptive equipment   Upper Body Dressing : Supervision/safety;Sitting   Lower Body Dressing: Supervision/safety;Sit to/from stand;With adaptive equipment   Toilet Transfer: Supervision/safety;Ambulation;BSC   Toileting- ArchitectClothing Manipulation and Hygiene: Supervision/safety;Sit to/from stand   Tub/ Shower Transfer: Supervision/safety;Tub transfer;Ambulation   Functional mobility during ADLs: Supervision/safety General ADL Comments: Educated pt on compensatory ADL strategies, brace wear schedule, safety with ADL post-operatively, and use of AE for LB dressing/bathing to adhere to back precautions. Discussed use of 3-in-1 over toilet to prevent bending when sitting down and pt will have wife measure to see if this will fit.     Vision Vision Assessment?: No apparent visual deficits   Perception     Praxis      Pertinent Vitals/Pain Pain Assessment: Faces Faces Pain Scale: Hurts a little bit Pain Location: low back Pain Descriptors / Indicators: Guarding Pain Intervention(s): Monitored during session;Repositioned     Hand Dominance Right   Extremity/Trunk Assessment Upper Extremity Assessment Upper Extremity Assessment: Overall WFL for tasks  assessed   Lower Extremity Assessment Lower Extremity Assessment: Overall WFL for tasks assessed   Cervical / Trunk Assessment Cervical / Trunk Assessment: Normal   Communication Communication Communication:  No difficulties   Cognition Arousal/Alertness: Awake/alert Behavior During Therapy: WFL for tasks assessed/performed Overall Cognitive Status: Within Functional Limits for tasks assessed                     General Comments       Exercises       Shoulder Instructions      Home Living Family/patient expects to be discharged to:: Private residence Living Arrangements: Spouse/significant other;Other (Comment) (2 large, young dogs) Available Help at Discharge: Family;Available PRN/intermittently Type of Home: House Home Access: Stairs to enter Entergy CorporationEntrance Stairs-Number of Steps: 2 Entrance Stairs-Rails: None Home Layout: Two level;Able to live on main level with bedroom/bathroom;Full bath on main level Alternate Level Stairs-Number of Steps: 10 Alternate Level Stairs-Rails: Right;Left;Can reach both Bathroom Shower/Tub: Tub/shower unit Shower/tub characteristics: Engineer, building servicesCurtain Bathroom Toilet: Standard     Home Equipment: Medical laboratory scientific officerCane - single point          Prior Functioning/Environment Level of Independence: Independent        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, completely independent        OT Problem List: Decreased strength;Decreased range of motion;Decreased activity tolerance;Decreased safety awareness;Decreased knowledge of use of DME or AE;Decreased knowledge of precautions;Pain   OT Treatment/Interventions:      OT Goals(Current goals can be found in the care plan section) Acute Rehab OT Goals Patient Stated Goal: to get home today OT Goal Formulation: With patient Time For Goal Achievement: 11/26/16 Potential to Achieve Goals: Good  OT Frequency:     Barriers to D/C:            Co-evaluation              End of Session Equipment Utilized During Treatment: Back brace  Activity Tolerance: Patient tolerated treatment well Patient left: in chair;with call bell/phone within reach   Time: 1050-1110 OT Time Calculation (min): 20 min Charges:   OT General Charges $OT Visit: 1 Procedure OT Evaluation $OT Eval Moderate Complexity: 1 Procedure  Doristine SectionCharity A Brooklyn Jeff, OTR/L [phone] 11/19/2016, 12:07 PM

## 2016-12-01 NOTE — Discharge Summary (Signed)
Patient ID: Bryan Harvey MRN: [id number] DOB/AGE: August 19, 1966 51 y.o.  Admit date: 11/17/2016 Discharge date: 11/19/2016  Admission Diagnoses:  Active Problems:   Radiculopathy   Discharge Diagnoses:  Same  Past Medical History:  Diagnosis Date  . Back pain   . BACK PAIN WITH RADICULOPATHY 08/19/2008   Qualifier: Diagnosis of  By: Thomos Lemons    . Childhood asthma    no problems since the age of 87  . DISC DISEASE, LUMBAR 12/28/2007   Qualifier: Diagnosis of  By: Thomos Lemons    . GASTROESOPHAGEAL REFLUX, NO ESOPHAGITIS 08/30/2006   Qualifier: Diagnosis of  By: Thomos Lemons    . GERD (gastroesophageal reflux disease)   . HERNIA, HIATAL, NONCONGENITAL 08/30/2006   Qualifier: Diagnosis of  By: Thomos Lemons    . Hyperlipidemia   . HYPERLIPIDEMIA 01/19/2008   Qualifier: Diagnosis of  By: Thomos Lemons    . PONV (postoperative nausea and vomiting)    2003 surgery, not after last fusion surgery    Surgeries: Procedure(s): LUMBAR 3-4 POSTERIOR SPINAL FUSION WITH INSTRUMENTATION AND ALLOGRAFT, removal of hardware and extention of fusion on 11/17/2016 - 11/18/2016   Consultants: None  Discharged Condition: Improved  Hospital Course: Bryan Harvey is an 51 y.o. male who was admitted 11/17/2016 for operative treatment of radiculopathy. Patient has severe unremitting pain that affects sleep, daily activities, and work/hobbies. After pre-op clearance the patient was taken to the operating room on 11/17/2016 - 11/18/2016 and underwent  Procedure(s): LUMBAR 3-4 POSTERIOR SPINAL FUSION WITH INSTRUMENTATION AND ALLOGRAFT, removal of hardware and extention of fusion.    Patient was given perioperative antibiotics:  Anti-infectives    Start     Dose/Rate Route Frequency Ordered Stop   11/18/16 1500  ceFAZolin (ANCEF) IVPB 1 g/50 mL premix     1 g 100 mL/hr over 30 Minutes Intravenous Every 8 hours 11/18/16 1243 11/18/16 2121   11/18/16 0730  ceFAZolin  (ANCEF) 3 g in dextrose 5 % 50 mL IVPB  Status:  Discontinued     3 g 160 mL/hr over 30 Minutes Intravenous  Once 11/18/16 0725 11/18/16 1235   11/17/16 1630  ceFAZolin (ANCEF) IVPB 1 g/50 mL premix     1 g 100 mL/hr over 30 Minutes Intravenous Every 8 hours 11/17/16 1311 11/18/16 0113   11/17/16 0800  ceFAZolin (ANCEF) 3 g in dextrose 5 % 50 mL IVPB     3 g 130 mL/hr over 30 Minutes Intravenous To [facility]Stay [facility] 11/16/16 0804 11/17/16 0905   11/17/16 0714  ceFAZolin (ANCEF) IVPB 2g/100 mL premix  Status:  Discontinued     2 g 200 mL/hr over 30 Minutes Intravenous On call to O.R. 11/17/16 4098 11/17/16 0716       Patient was given sequential compression devices, early ambulation to prevent DVT.  Patient benefited maximally from hospital stay and there were no complications.    Recent vital signs: BP 117/76 (BP Location: Right Arm)   Pulse 80   Temp 97.6 F (36.4 C) (Oral)   Resp 16   Ht 6\' 4"  (1.93 m)   Wt 133.4 kg (294 lb)   SpO2 97%   BMI 35.79 kg/m   Discharge Medications:   Allergies as of 11/19/2016      Reactions   No Known Allergies       Medication List    STOP taking these medications   predniSONE 10 MG (21) Tbpk tablet Commonly known as:  STERAPRED UNI-PAK 21 TAB     TAKE these medications   modafinil 200 MG tablet Commonly known as:  PROVIGIL Take 1 tablet (200 mg total) by mouth daily. Please dispense generic.  NEED FOLLOW UP APPOINTMENT FOR MORE REFILLS.   oxyCODONE-acetaminophen 5-325 MG tablet Commonly known as:  ROXICET Take 1 tablet by mouth every 6 (six) hours as needed.   triamcinolone cream 0.1 % Commonly known as:  KENALOG Apply 1 application topically 2 (two) times daily as needed (eczema).   ZEGERID OTC PO Take 1 tablet by mouth daily as needed (heartburn).       Diagnostic Studies: Dg Chest 2 View  Result Date: 11/08/2016 CLINICAL DATA:  Preoperative examination prior to lumbar fusion. EXAM: CHEST  2 VIEW COMPARISON:  Chest  x-ray of October 25, 2013 FINDINGS: There is mild chronic elevation of the right hemidiaphragm. The lungs are adequately inflated. The interstitial markings are coarse at both bases but are stable. The heart and pulmonary vascularity are normal. The mediastinum is normal in width. There is no pleural effusion. The trachea is midline. The bony thorax exhibits no acute abnormality. Nipple shadows are visible bilaterally. IMPRESSION: There is no acute cardiopulmonary disease. 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 Electronically Signed   By: David  SwazilandJordan M.D.   On: 11/08/2016 16:39   Dg Lumbar Spine 2-3 Views  Result Date: 11/18/2016 CLINICAL DATA:  Status post surgical posterior fusion of L3-4. EXAM: LUMBAR SPINE - 2-3 VIEW COMPARISON:  Radiographs of November 17, 2016. FINDINGS: Patient is status post surgical posterior fusion of L4-5 with bilateral intrapedicular screws and interbody fusion. Stabilization device is noted at L5-S1. Status post lateral fusion of L3-4 with right-sided fixation plate. Good alignment of vertebral bodies is noted. IMPRESSION: Postsurgical changes as described above. Electronically Signed   By: Lupita RaiderJames  Green Jr, M.D.   On: 11/18/2016 13:14   Dg Lumbar Spine 2-3 Views  Result Date: 11/18/2016 CLINICAL DATA:  Lumbar fusion. EXAM: DG C-ARM 61-120 MIN; LUMBAR SPINE - 2-3 VIEW COMPARISON:  Radiograph 11/18/2016 FINDINGS: Three spot intraoperative radiographs of lumbar spine provided. Pedicle screws at L3, L4 and L5 with posterior fusion rods. Interbody spacers at L5-S1. Lateral fusion at L3-L4. IMPRESSION: Posterior lumbar fusion from L3-L5 as above. Electronically Signed   By: Genevive BiStewart  Edmunds M.D.   On: 11/18/2016 10:26   Dg Lumbar Spine 2-3 Views  Result Date: 11/17/2016 CLINICAL DATA:  Lumbar disc protrusion. EXAM: DG C-ARM 61-120 MIN; LUMBAR SPINE - 2-3 VIEW COMPARISON:  MRI dated 08/08/2016 FINDINGS: AP and lateral C-arm images demonstrate the  patient has undergone lateral and interbody fusion at L3-4. The hardware appears in excellent position. Prior fusion at L4-5 and L5-S1. Alignment is anatomic. FLUOROSCOPY TIME:  1 minutes 55 seconds C-arm fluoroscopic images were obtained intraoperatively and submitted for post operative interpretation. Please see the performing provider's procedural report for the fluoroscopy time utilized. IMPRESSION: Fusion performed at L3-4. Electronically Signed   By: Francene BoyersJames  Maxwell M.D.   On: 11/17/2016 11:22   Dg C-arm 61-120 Min  Result Date: 11/18/2016 CLINICAL DATA:  Lumbar fusion. EXAM: DG C-ARM 61-120 MIN; LUMBAR SPINE - 2-3 VIEW COMPARISON:  Radiograph 11/18/2016 FINDINGS: Three spot intraoperative radiographs of lumbar spine provided. Pedicle screws at L3, L4 and L5 with posterior fusion rods. Interbody spacers at L5-S1. Lateral fusion at L3-L4. IMPRESSION: Posterior lumbar fusion from L3-L5 as above. Electronically Signed   By: Genevive BiStewart  Edmunds M.D.   On: 11/18/2016 10:26  Dg C-arm 61-120 Min  Result Date: 11/17/2016 CLINICAL DATA:  Lumbar disc protrusion. EXAM: DG C-ARM 61-120 MIN; LUMBAR SPINE - 2-3 VIEW COMPARISON:  MRI dated 08/08/2016 FINDINGS: AP and lateral C-arm images demonstrate the patient has undergone lateral and interbody fusion at L3-4. The hardware appears in excellent position. Prior fusion at L4-5 and L5-S1. Alignment is anatomic. FLUOROSCOPY TIME:  1 minutes 55 seconds C-arm fluoroscopic images were obtained intraoperatively and submitted for post operative interpretation. Please see the performing provider's procedural report for the fluoroscopy time utilized. IMPRESSION: Fusion performed at L3-4. Electronically Signed   By: Francene Boyers M.D.   On: 11/17/2016 11:22    Disposition: 01-Home or Self Care   POD #1 s/p L3/4 PSF and POD #2 after lateral L3/4 fusion, doing well   - up with PT/OT, encourage ambulation - Percocet for pain, Valium for muscle spasms -Written scripts for  pain signed and in chart -D/C instructions sheet printed and in chart -D/C today  -F/U in office 2 weeks   Signed: Georga Bora 12/01/2016, 11:10 AM

## 2017-05-06 ENCOUNTER — Ambulatory Visit (INDEPENDENT_AMBULATORY_CARE_PROVIDER_SITE_OTHER): Payer: 59 | Admitting: Physician Assistant

## 2017-05-06 ENCOUNTER — Encounter: Payer: Self-pay | Admitting: Physician Assistant

## 2017-05-06 VITALS — BP 109/74 | HR 73 | Ht 76.0 in | Wt 291.0 lb

## 2017-05-06 DIAGNOSIS — Z131 Encounter for screening for diabetes mellitus: Secondary | ICD-10-CM | POA: Diagnosis not present

## 2017-05-06 DIAGNOSIS — M5136 Other intervertebral disc degeneration, lumbar region: Secondary | ICD-10-CM

## 2017-05-06 DIAGNOSIS — L858 Other specified epidermal thickening: Secondary | ICD-10-CM

## 2017-05-06 DIAGNOSIS — L309 Dermatitis, unspecified: Secondary | ICD-10-CM | POA: Diagnosis not present

## 2017-05-06 DIAGNOSIS — G4726 Circadian rhythm sleep disorder, shift work type: Secondary | ICD-10-CM

## 2017-05-06 DIAGNOSIS — Z13220 Encounter for screening for lipoid disorders: Secondary | ICD-10-CM | POA: Diagnosis not present

## 2017-05-06 DIAGNOSIS — E7800 Pure hypercholesterolemia, unspecified: Secondary | ICD-10-CM

## 2017-05-06 MED ORDER — TRIAMCINOLONE 0.1 % CREAM:EUCERIN CREAM 1:1: TOPICAL_CREAM | Freq: Two times a day (BID) | CUTANEOUS | Status: DC

## 2017-05-06 MED ORDER — MODAFINIL 200 MG PO TABS: 200.0000 mg | ORAL_TABLET | Freq: Every day | ORAL | 1 refills | Status: DC

## 2017-05-06 NOTE — Patient Instructions (Signed)
Lac hydrin OTc on effected areas.

## 2017-05-06 NOTE — Progress Notes (Signed)
   Subjective:    Patient ID: Bryan Harvey, male    DOB: 09/12/1966, 51 y.o.   MRN: [id number]  HPI  Pt is a 51 yo male who presents to the clinic to establish care with a new provider after his left practice. He needs medication refills.   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 He take provigil on days he works night shift. No problems or concerns been on medication for a while.   Pt has eczema off and on. He previously had eucerin/triamincinolone cream that he would like again to use as needed.   Pt has elevated cholesterol in his hx but has documented possible rhabdomyolysis due to statin therapy. He admits his diet and not been great.   .. Active Ambulatory Problems    Diagnosis Date Noted  . Hyperlipidemia 01/19/2008  . OBESITY, NOS 08/30/2006  . GASTROESOPHAGEAL REFLUX, NO ESOPHAGITIS 08/30/2006  . HERNIA, HIATAL, NONCONGENITAL 08/30/2006  . Lumbar degenerative disc disease 12/28/2007  . 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 Eczema 01/24/2013  . Colon cancer screening 03/19/2014  . Family history of colon cancer 03/19/2014  . Rhabdomyolysis due to statin therapy 08/07/2014  . Osteoarthritis, hip, bilateral 10/07/2014  . Superficial thrombophlebitis of left leg 11/04/2014  . Left shoulder pain 02/04/2015  . Left cervical radiculopathy 03/04/2015  . Radiculopathy 11/17/2016  . Keratosis pilaris 05/08/2017   Resolved Ambulatory Problems    Diagnosis Date Noted  . BACK PAIN WITH RADICULOPATHY 08/19/2008  . Radiculopathy 11/01/2013   Past Medical History:  Diagnosis Date  . Back pain   . BACK PAIN WITH RADICULOPATHY 08/19/2008  . Childhood asthma   . DISC DISEASE, LUMBAR 12/28/2007  . GASTROESOPHAGEAL REFLUX, NO ESOPHAGITIS 08/30/2006  . GERD (gastroesophageal reflux disease)   . HERNIA, HIATAL, NONCONGENITAL 08/30/2006  . Hyperlipidemia   . HYPERLIPIDEMIA 01/19/2008  . PONV (postoperative nausea and vomiting)        Review of Systems  All other systems reviewed and are negative.      Objective:   Physical Exam  Constitutional: He is oriented to person, place, and time. He appears well-developed and well-nourished.  HENT:  Head: Normocephalic and atraumatic.  Cardiovascular: Normal rate, regular rhythm and normal heart sounds.   Pulmonary/Chest: Effort normal and breath sounds normal.  Neurological: He is alert and oriented to person, place, and time.  Psychiatric: He has a normal mood and affect. His behavior is normal.          Assessment & Plan:  Marland Kitchen.Marland Kitchen.Diagnoses and all orders for this visit:  Shift work sleep disorder -     modafinil (PROVIGIL) 200 MG tablet; Take 1 tablet (200 mg total) by mouth daily. Please dispense generic.  Lumbar degenerative disc disease  Eczema, unspecified type -     triamcinolone 0.1 % cream : eucerin cream, 1:1; Apply topically 2 (two) times daily.  Screening for lipid disorders -     Lipid panel  Screening for diabetes mellitus -     COMPLETE METABOLIC PANEL WITH GFR  Pure hypercholesterolemia -     Lipid panel  Keratosis pilaris   Will get repeat fasting labs. Will discuss ways to lower LdL without statin.   He has some keratosis pilaris on neck. Use OTC lac hydrin.

## 2017-05-08 DIAGNOSIS — L858 Other specified epidermal thickening: Secondary | ICD-10-CM | POA: Insufficient documentation

## 2017-05-24 ENCOUNTER — Encounter: Payer: Self-pay | Admitting: Sports Medicine

## 2017-05-24 ENCOUNTER — Ambulatory Visit (INDEPENDENT_AMBULATORY_CARE_PROVIDER_SITE_OTHER): Payer: 59 | Admitting: Sports Medicine

## 2017-05-24 VITALS — BP 129/82 | HR 79 | Ht 76.0 in | Wt 295.0 lb

## 2017-05-24 DIAGNOSIS — K8050 Calculus of bile duct without cholangitis or cholecystitis without obstruction: Secondary | ICD-10-CM | POA: Diagnosis not present

## 2017-05-24 DIAGNOSIS — R109 Unspecified abdominal pain: Secondary | ICD-10-CM | POA: Diagnosis not present

## 2017-05-24 LAB — CBC WITH DIFFERENTIAL/PLATELET
Basophils Absolute: 0 {cells}/uL (ref 0–200)
Basophils Relative: 0 %
Eosinophils Absolute: 213 {cells}/uL (ref 15–500)
Eosinophils Relative: 3 %
HCT: 40.3 % (ref 38.5–50.0)
Hemoglobin: 13.5 g/dL (ref 13.2–17.1)
Lymphocytes Relative: 24 %
Lymphs Abs: 1704 {cells}/uL (ref 850–3900)
MCH: 28.8 pg (ref 27.0–33.0)
MCHC: 33.5 g/dL (ref 32.0–36.0)
MCV: 86.1 fL (ref 80.0–100.0)
MPV: 8.7 fL (ref 7.5–12.5)
Monocytes Absolute: 355 {cells}/uL (ref 200–950)
Monocytes Relative: 5 %
Neutro Abs: 4828 cells/uL (ref 1500–7800)
Neutrophils Relative %: 68 %
Platelets: 330 K/uL (ref 140–400)
RBC: 4.68 MIL/uL (ref 4.20–5.80)
RDW: 14 % (ref 11.0–15.0)
WBC: 7.1 10*3/uL (ref 3.8–10.8)

## 2017-05-24 LAB — POCT URINALYSIS DIPSTICK
Bilirubin, UA: NEGATIVE
Blood, UA: NEGATIVE
Glucose, UA: NEGATIVE
Ketones, UA: NEGATIVE
Leukocytes, UA: NEGATIVE
Nitrite, UA: NEGATIVE
Protein, UA: NEGATIVE
Spec Grav, UA: 1.015 (ref 1.010–1.025)
Urobilinogen, UA: 0.2 E.U./dL
pH, UA: 6.5 (ref 5.0–8.0)

## 2017-05-24 NOTE — Progress Notes (Signed)
  Subjective:    CC: Abdominal pain  HPI: For the past several weeks this pleasant 51 year old male has had right upper quadrant pain with radiation to the shoulder blade, worse with eating greasy foods, initially was colicky but has now become constant. Moderate, persistent. Minimal nausea, no vomiting, diarrhea.  Past medical history:  Negative.  See flowsheet/record as well for more information.  Surgical history: Negative.  See flowsheet/record as well for more information.  Family history: Negative.  See flowsheet/record as well for more information.  Social history: Negative.  See flowsheet/record as well for more information.  Allergies, and medications have been entered into the medical record, reviewed, and no changes needed.   Review of Systems: No fevers, chills, night sweats, weight loss, chest pain, or shortness of breath.   Objective:    General: Well Developed, well nourished, and in no acute distress.  Neuro: Alert and oriented x3, extra-ocular muscles intact, sensation grossly intact.  HEENT: Normocephalic, atraumatic, pupils equal round reactive to light, neck supple, no masses, no lymphadenopathy, thyroid nonpalpable.  Skin: Warm and dry, no rashes. Cardiac: Regular rate and rhythm, no murmurs rubs or gallops, no lower extremity edema.  Respiratory: Clear to auscultation bilaterally. Not using accessory muscles, speaking in full sentences. Abdomen: Soft, tender to palpation in the right upper quadrant with a positive Murphy's sign, nondistended, no bowel sounds, palpable masses, guarding, rigidity, or rebound tenderness.  Impression and Recommendations:    Biliary colic Classic biliary colic, CBC, CMP, lipase, amylase, abdominal ultrasound and urgent referral to general surgery. Low fat diet until then.

## 2017-05-24 NOTE — Assessment & Plan Note (Signed)
Classic biliary colic, CBC, CMP, lipase, amylase, abdominal ultrasound and urgent referral to general surgery. Low fat diet until then.

## 2017-05-24 NOTE — Patient Instructions (Signed)
Biliary Colic, Adult °Biliary colic is severe pain caused by a problem with a small organ in the upper right part of your belly (gallbladder). The gallbladder stores a digestive fluid produced in the liver (bile) that helps the body break down fat. Bile and other digestive enzymes are carried from the liver to the small intestine though tube-like structures (bile ducts). The gallbladder and the bile ducts form the biliary tract. °Sometimes hard deposits of digestive fluids form in the gallbladder (gallstones) and block the flow of bile from the gallbladder, causing biliary colic. This condition is also called a gallbladder attack. Gallstones can be as small as a grain of sand or as big as a golf ball. There could be just one gallstone in the gallbladder, or there could be many. °What are the causes? °Biliary colic is usually caused by gallstones. Less often, a tumor could block the flow of bile from the gallbladder and trigger biliary colic. °What increases the risk? °This condition is more likely to develop in: °· Women. °· People of Hispanic descent. °· People with a family history of gallstones. °· People who are obese. °· People who suddenly or quickly lose weight. °· People who eat a high-calorie, low-fiber diet that is rich in refined carbs (carbohydrates), such as white bread and white rice. °· People who have an intestinal disease that affects nutrient absorption, such as Crohn disease. °· People who have a metabolic condition, such as metabolic syndrome or diabetes. ° °What are the signs or symptoms? °Severe pain in the upper right side of the belly is the main symptom of biliary colic. You may feel this pain below the chest but above the hip. This pain often occurs at night or after eating a very fatty meal. This pain may get worse for up to an hour and last as long as 12 hours. In most cases, the pain fades (subsides) within a couple hours. °Other symptoms of this condition include: °· Nausea and  vomiting. °· Pain under the right shoulder. ° °How is this diagnosed? °This condition is diagnosed based on your medical history, your symptoms, and a physical exam. You may have tests, including: °· Blood tests to rule out infection or inflammation of the bile ducts, gallbladder, pancreas, or liver. °· Imaging studies such as: °? Ultrasound. °? CT scan. °? MRI. ° °In some cases, you may need to have an imaging study done using a small amount of radioactive material (nuclear medicine) to confirm the diagnosis. °How is this treated? °Treatment for this condition may include medicine to relieve your pain or nausea. If you have gallstones that are causing biliary colic, you may need surgery to remove the gallbladder (cholecystectomy). Gallstones can also be dissolved gradually with medicine. It may take months or years before the gallstones are completely gone. °Follow these instructions at home: °· Take over-the-counter and prescription medicines only as told by your health care provider. °· Drink enough fluid to keep your urine clear or pale yellow. °· Follow instructions from your health care provider about eating or drinking restrictions. These may include avoiding: °? Fatty, greasy, and fried foods. °? Any foods that make the pain worse. °? Overeating. °? Having a large meal after not eating for a while. °· Keep all follow-up visits as told by your health care provider. This is important. °How is this prevented? °Steps to prevent this condition include: °· Maintaining a healthy body weight. °· Getting regular exercise. °· Eating a healthy, high-fiber, low-fat diet. °· Limiting   how much sugar and refined carbs you eat, such as sweets, white flour, and white rice. ° °Contact a health care provider if: °· Your pain lasts more than 5 hours. °· You vomit. °· You have a fever and chills. °· Your pain gets worse. °Get help right away if: °· Your skin or the whites of your eyes look yellow (jaundice). °· Your have  tea-colored urine and light-colored stools. °· You are dizzy or you faint. °This information is not intended to replace advice given to you by your health care provider. Make sure you discuss any questions you have with your health care provider. °Document Released: 04/11/2006 Document Revised: 07/06/2016 Document Reviewed: 05/24/2016 °[employer] Interactive Patient Education © 2018 [employer] ° °

## 2017-05-25 LAB — COMPREHENSIVE METABOLIC PANEL WITH GFR
ALT: 15 U/L (ref 9–46)
Alkaline Phosphatase: 76 U/L (ref 40–115)
BUN: 21 mg/dL (ref 7–25)
Potassium: 4.2 mmol/L (ref 3.5–5.3)
Total Protein: 6.7 g/dL (ref 6.1–8.1)

## 2017-05-25 LAB — COMPREHENSIVE METABOLIC PANEL
AST: 14 U/L (ref 10–35)
Albumin: 4.5 g/dL (ref 3.6–5.1)
CO2: 24 mmol/L (ref 20–31)
Calcium: 9.4 mg/dL (ref 8.6–10.3)
Chloride: 105 mmol/L (ref 98–110)
Creat: 0.95 mg/dL (ref 0.70–1.33)
Glucose, Bld: 86 mg/dL (ref 65–99)
Sodium: 138 mmol/L (ref 135–146)
Total Bilirubin: 0.4 mg/dL (ref 0.2–1.2)

## 2017-05-25 LAB — AMYLASE: Amylase: 35 U/L (ref 21–101)

## 2017-05-25 LAB — LIPASE: Lipase: 14 U/L (ref 7–60)

## 2017-05-26 ENCOUNTER — Ambulatory Visit (HOSPITAL_BASED_OUTPATIENT_CLINIC_OR_DEPARTMENT_OTHER)
Admission: RE | Admit: 2017-05-26 | Discharge: 2017-05-26 | Disposition: A | Discharge status: Home / Self Care | Payer: 59 | Source: Ambulatory Visit | Attending: Sports Medicine | Admitting: Sports Medicine

## 2017-05-26 DIAGNOSIS — K8020 Calculus of gallbladder without cholecystitis without obstruction: Secondary | ICD-10-CM | POA: Insufficient documentation

## 2017-05-26 DIAGNOSIS — K8050 Calculus of bile duct without cholangitis or cholecystitis without obstruction: Secondary | ICD-10-CM

## 2017-06-17 ENCOUNTER — Ambulatory Visit: Payer: Self-pay | Admitting: General Surgery

## 2017-06-22 NOTE — Patient Instructions (Addendum)
Bryan Harvey  06/22/2017   Your procedure is scheduled on: 06-30-17   [facility]  Entrance [place]  elevators to 3rd floor to  Short Stay Center at 7:30 AM.   Call this number if you have problems the morning of surgery [phone]    Remember: ONLY 1 PERSON MAY GO WITH YOU TO SHORT STAY TO GET  READY MORNING OF YOUR SURGERY.  Do not eat food or drink liquids :After Midnight.     Take these medicines the morning of surgery with A SIP OF WATER: Omeprazole (Prilosec), Gabapentin (Neurontin)                                You may not have any metal on your body including hair pins and              piercings  Do not wear jewelry, lotions, powders, deodorant             Men may shave face and neck.   Do not bring valuables to the hospital. [facility] IS NOT             RESPONSIBLE   FOR VALUABLES.  Contacts, dentures or bridgework may not be worn into surgery.  Leave suitcase in the car. After surgery it may be brought to your room.     Please read over the following fact sheets you were given: _____________________________________________________________________             [facility] - Preparing for Surgery Before surgery, you can play an important role.  Because skin is not sterile, your skin needs to be as free of germs as possible.  You can reduce the number of germs on your skin by washing with CHG (chlorahexidine gluconate) soap before surgery.  CHG is an antiseptic cleaner which kills germs and bonds with the skin to continue killing germs even after washing. Please DO NOT use if you have an allergy to CHG or antibacterial soaps.  If your skin becomes reddened/irritated stop using the CHG and inform your nurse when you arrive at Short Stay. Do not shave (including legs and underarms) for at least 48 hours prior to the first CHG shower.  You may shave your face/neck. Please follow these instructions carefully:  1.  Shower with  CHG Soap the night before surgery and the  morning of Surgery.  2.  If you choose to wash your hair, wash your hair first as usual with your  normal  shampoo.  3.  After you shampoo, rinse your hair and body thoroughly to remove the  shampoo.                           4.  Use CHG as you would any other liquid soap.  You can apply chg directly  to the skin and wash                       Gently with a scrungie or clean washcloth.  5.  Apply the CHG Soap to your body ONLY FROM THE NECK DOWN.   Do not use on face/ open  Wound or open sores. Avoid contact with eyes, ears mouth and genitals (private parts).                       Wash face,  Genitals (private parts) with your normal soap.             6.  Wash thoroughly, paying special attention to the area where your surgery  will be performed.  7.  Thoroughly rinse your body with warm water from the neck down.  8.  DO NOT shower/wash with your normal soap after using and rinsing off  the CHG Soap.                9.  Pat yourself dry with a clean towel.            10.  Wear clean pajamas.            11.  Place clean sheets on your bed the night of your first shower and do not  sleep with pets. Day of Surgery : Do not apply any lotions/deodorants the morning of surgery.  Please wear clean clothes to the hospital/surgery center.  FAILURE TO FOLLOW THESE INSTRUCTIONS MAY RESULT IN THE CANCELLATION OF YOUR SURGERY PATIENT SIGNATURE_________________________________  NURSE SIGNATURE__________________________________  ________________________________________________________________________

## 2017-06-23 ENCOUNTER — Encounter (HOSPITAL_COMMUNITY)
Admission: RE | Admit: 2017-06-23 | Discharge: 2017-06-23 | Disposition: A | Discharge status: Home / Self Care | Payer: 59 | Source: Ambulatory Visit | Attending: General Surgery | Admitting: General Surgery

## 2017-06-23 ENCOUNTER — Encounter (HOSPITAL_COMMUNITY): Payer: Self-pay

## 2017-06-23 DIAGNOSIS — K8020 Calculus of gallbladder without cholecystitis without obstruction: Secondary | ICD-10-CM | POA: Insufficient documentation

## 2017-06-23 DIAGNOSIS — Z01818 Encounter for other preprocedural examination: Secondary | ICD-10-CM | POA: Insufficient documentation

## 2017-06-23 LAB — CBC
HEMATOCRIT: 38.4 % — AB (ref 39.0–52.0)
HEMOGLOBIN: 13.5 g/dL (ref 13.0–17.0)
MCH: 29.2 pg (ref 26.0–34.0)
MCHC: 35.2 g/dL (ref 30.0–36.0)
MCV: 82.9 fL (ref 78.0–100.0)
Platelets: 298 10*3/uL (ref 150–400)
RBC: 4.63 MIL/uL (ref 4.22–5.81)
RDW: 12.5 % (ref 11.5–15.5)
WBC: 5.9 10*3/uL (ref 4.0–10.5)

## 2017-06-30 ENCOUNTER — Ambulatory Visit (HOSPITAL_COMMUNITY): Payer: 59 | Admitting: Anesthesiology

## 2017-06-30 ENCOUNTER — Ambulatory Visit (HOSPITAL_COMMUNITY)
Admission: RE | Admit: 2017-06-30 | Discharge: 2017-06-30 | Disposition: A | Discharge status: Home / Self Care | Payer: 59 | Source: Ambulatory Visit | Attending: General Surgery | Admitting: General Surgery

## 2017-06-30 ENCOUNTER — Encounter (HOSPITAL_COMMUNITY)
Admission: RE | Disposition: A | Discharge status: Home / Self Care | Payer: Self-pay | Source: Ambulatory Visit | Attending: General Surgery

## 2017-06-30 ENCOUNTER — Encounter (HOSPITAL_COMMUNITY): Payer: Self-pay | Admitting: *Deleted

## 2017-06-30 DIAGNOSIS — Z820 Family history of epilepsy and other diseases of the nervous system: Secondary | ICD-10-CM | POA: Diagnosis not present

## 2017-06-30 DIAGNOSIS — Z8261 Family history of arthritis: Secondary | ICD-10-CM | POA: Diagnosis not present

## 2017-06-30 DIAGNOSIS — Z833 Family history of diabetes mellitus: Secondary | ICD-10-CM | POA: Diagnosis not present

## 2017-06-30 DIAGNOSIS — M1990 Unspecified osteoarthritis, unspecified site: Secondary | ICD-10-CM | POA: Diagnosis not present

## 2017-06-30 DIAGNOSIS — Z79899 Other long term (current) drug therapy: Secondary | ICD-10-CM | POA: Insufficient documentation

## 2017-06-30 DIAGNOSIS — Z9889 Other specified postprocedural states: Secondary | ICD-10-CM | POA: Diagnosis not present

## 2017-06-30 DIAGNOSIS — K8020 Calculus of gallbladder without cholecystitis without obstruction: Secondary | ICD-10-CM | POA: Diagnosis present

## 2017-06-30 DIAGNOSIS — Z800 Family history of malignant neoplasm of digestive organs: Secondary | ICD-10-CM | POA: Diagnosis not present

## 2017-06-30 DIAGNOSIS — Z8042 Family history of malignant neoplasm of prostate: Secondary | ICD-10-CM | POA: Diagnosis not present

## 2017-06-30 DIAGNOSIS — E7800 Pure hypercholesterolemia, unspecified: Secondary | ICD-10-CM | POA: Insufficient documentation

## 2017-06-30 DIAGNOSIS — Z9852 Vasectomy status: Secondary | ICD-10-CM | POA: Insufficient documentation

## 2017-06-30 DIAGNOSIS — K660 Peritoneal adhesions (postprocedural) (postinfection): Secondary | ICD-10-CM | POA: Insufficient documentation

## 2017-06-30 DIAGNOSIS — K8010 Calculus of gallbladder with chronic cholecystitis without obstruction: Secondary | ICD-10-CM | POA: Insufficient documentation

## 2017-06-30 DIAGNOSIS — K219 Gastro-esophageal reflux disease without esophagitis: Secondary | ICD-10-CM | POA: Diagnosis not present

## 2017-06-30 DIAGNOSIS — I739 Peripheral vascular disease, unspecified: Secondary | ICD-10-CM | POA: Insufficient documentation

## 2017-06-30 HISTORY — PX: CHOLECYSTECTOMY: SHX55

## 2017-06-30 SURGERY — LAPAROSCOPIC CHOLECYSTECTOMY
Anesthesia: General | Site: Abdomen

## 2017-06-30 MED ORDER — KETOROLAC TROMETHAMINE 30 MG/ML IJ SOLN
INTRAMUSCULAR | Status: AC
  Filled 2017-06-30: qty 1

## 2017-06-30 MED ORDER — GABAPENTIN 300 MG PO CAPS
300.0000 mg | ORAL_CAPSULE | ORAL | Status: AC
  Administered 2017-06-30: 300 mg via ORAL
  Filled 2017-06-30: qty 1

## 2017-06-30 MED ORDER — ROCURONIUM BROMIDE 100 MG/10ML IV SOLN
INTRAVENOUS | Status: DC | PRN
  Administered 2017-06-30 (×2): 10 mg via INTRAVENOUS
  Administered 2017-06-30: 50 mg via INTRAVENOUS

## 2017-06-30 MED ORDER — ROCURONIUM BROMIDE 50 MG/5ML IV SOSY
PREFILLED_SYRINGE | INTRAVENOUS | Status: AC
  Filled 2017-06-30: qty 5

## 2017-06-30 MED ORDER — SODIUM CHLORIDE 0.9% FLUSH: 3.0000 mL | INTRAVENOUS | Status: DC | PRN

## 2017-06-30 MED ORDER — CHLORHEXIDINE GLUCONATE CLOTH 2 % EX PADS: 6.0000 | MEDICATED_PAD | Freq: Once | CUTANEOUS | Status: DC

## 2017-06-30 MED ORDER — ONDANSETRON HCL 4 MG/2ML IJ SOLN
INTRAMUSCULAR | Status: DC | PRN
  Administered 2017-06-30: 4 mg via INTRAVENOUS

## 2017-06-30 MED ORDER — KETOROLAC TROMETHAMINE 30 MG/ML IJ SOLN
INTRAMUSCULAR | Status: DC | PRN
  Administered 2017-06-30: 30 mg via INTRAVENOUS

## 2017-06-30 MED ORDER — DEXAMETHASONE SODIUM PHOSPHATE 10 MG/ML IJ SOLN
INTRAMUSCULAR | Status: DC | PRN
  Administered 2017-06-30: 10 mg via INTRAVENOUS

## 2017-06-30 MED ORDER — PROPOFOL 10 MG/ML IV BOLUS
INTRAVENOUS | Status: DC | PRN
  Administered 2017-06-30: 200 mg via INTRAVENOUS

## 2017-06-30 MED ORDER — PROPOFOL 10 MG/ML IV BOLUS
INTRAVENOUS | Status: AC
  Filled 2017-06-30: qty 20

## 2017-06-30 MED ORDER — HYDROMORPHONE HCL-NACL 0.5-0.9 MG/ML-% IV SOSY: 0.2500 mg | PREFILLED_SYRINGE | INTRAVENOUS | Status: DC | PRN

## 2017-06-30 MED ORDER — LIDOCAINE HCL (CARDIAC) 20 MG/ML IV SOLN
INTRAVENOUS | Status: DC | PRN
  Administered 2017-06-30: 100 mg via INTRAVENOUS

## 2017-06-30 MED ORDER — ACETAMINOPHEN 650 MG RE SUPP
650.0000 mg | RECTAL | Status: DC | PRN
Start: 2017-06-30 — End: 2017-06-30
  Filled 2017-06-30: qty 1

## 2017-06-30 MED ORDER — 0.9 % SODIUM CHLORIDE (POUR BTL) OPTIME
TOPICAL | Status: DC | PRN
  Administered 2017-06-30: 1000 mL

## 2017-06-30 MED ORDER — SODIUM CHLORIDE 0.9 % IV SOLN: 250.0000 mL | INTRAVENOUS | Status: DC | PRN

## 2017-06-30 MED ORDER — SUGAMMADEX SODIUM 500 MG/5ML IV SOLN
INTRAVENOUS | Status: DC | PRN
  Administered 2017-06-30: 300 mg via INTRAVENOUS

## 2017-06-30 MED ORDER — ACETAMINOPHEN 500 MG PO TABS
1000.0000 mg | ORAL_TABLET | ORAL | Status: AC
  Administered 2017-06-30: 1000 mg via ORAL
  Filled 2017-06-30: qty 2

## 2017-06-30 MED ORDER — LACTATED RINGERS IR SOLN
Status: DC | PRN
  Administered 2017-06-30: 1000 mL

## 2017-06-30 MED ORDER — DEXAMETHASONE SODIUM PHOSPHATE 10 MG/ML IJ SOLN
INTRAMUSCULAR | Status: AC
  Filled 2017-06-30: qty 1

## 2017-06-30 MED ORDER — ONDANSETRON HCL 4 MG/2ML IJ SOLN
INTRAMUSCULAR | Status: AC
  Filled 2017-06-30: qty 2

## 2017-06-30 MED ORDER — SUGAMMADEX SODIUM 500 MG/5ML IV SOLN
INTRAVENOUS | Status: AC
  Filled 2017-06-30: qty 5

## 2017-06-30 MED ORDER — LACTATED RINGERS IV SOLN
INTRAVENOUS | Status: DC
  Administered 2017-06-30 (×2): via INTRAVENOUS

## 2017-06-30 MED ORDER — FENTANYL CITRATE (PF) 250 MCG/5ML IJ SOLN
INTRAMUSCULAR | Status: AC
  Filled 2017-06-30: qty 5

## 2017-06-30 MED ORDER — LIDOCAINE 2% (20 MG/ML) 5 ML SYRINGE
INTRAMUSCULAR | Status: AC
  Filled 2017-06-30: qty 5

## 2017-06-30 MED ORDER — BUPIVACAINE-EPINEPHRINE 0.25% -1:200000 IJ SOLN
INTRAMUSCULAR | Status: DC | PRN
  Administered 2017-06-30: 27 mL

## 2017-06-30 MED ORDER — MORPHINE SULFATE (PF) 4 MG/ML IV SOLN: 1.0000 mg | INTRAVENOUS | Status: DC | PRN

## 2017-06-30 MED ORDER — ACETAMINOPHEN 325 MG PO TABS
650.0000 mg | ORAL_TABLET | ORAL | Status: DC | PRN
Start: 2017-06-30 — End: 2017-06-30

## 2017-06-30 MED ORDER — OXYCODONE HCL 5 MG PO TABS
5.0000 mg | ORAL_TABLET | ORAL | Status: DC | PRN
  Administered 2017-06-30: 5 mg via ORAL
  Filled 2017-06-30: qty 1

## 2017-06-30 MED ORDER — BUPIVACAINE-EPINEPHRINE (PF) 0.25% -1:200000 IJ SOLN
INTRAMUSCULAR | Status: AC
  Filled 2017-06-30: qty 30

## 2017-06-30 MED ORDER — MIDAZOLAM HCL 2 MG/2ML IJ SOLN
INTRAMUSCULAR | Status: AC
  Filled 2017-06-30: qty 2

## 2017-06-30 MED ORDER — MIDAZOLAM HCL 5 MG/5ML IJ SOLN
INTRAMUSCULAR | Status: DC | PRN
  Administered 2017-06-30: 2 mg via INTRAVENOUS

## 2017-06-30 MED ORDER — OXYCODONE HCL 5 MG PO TABS: 5.0000 mg | ORAL_TABLET | ORAL | 0 refills | Status: DC | PRN

## 2017-06-30 MED ORDER — SODIUM CHLORIDE 0.9% FLUSH: 3.0000 mL | Freq: Two times a day (BID) | INTRAVENOUS | Status: DC

## 2017-06-30 MED ORDER — EPHEDRINE SULFATE 50 MG/ML IJ SOLN
INTRAMUSCULAR | Status: DC | PRN
  Administered 2017-06-30: 10 mg via INTRAVENOUS

## 2017-06-30 MED ORDER — FENTANYL CITRATE (PF) 100 MCG/2ML IJ SOLN
INTRAMUSCULAR | Status: DC | PRN
  Administered 2017-06-30: 100 ug via INTRAVENOUS
  Administered 2017-06-30: 50 ug via INTRAVENOUS
  Administered 2017-06-30: 100 ug via INTRAVENOUS

## 2017-06-30 MED ORDER — CEFOTETAN DISODIUM-DEXTROSE 2-2.08 GM-% IV SOLR
2.0000 g | INTRAVENOUS | Status: AC
  Administered 2017-06-30: 2 g via INTRAVENOUS
  Filled 2017-06-30: qty 50

## 2017-06-30 SURGICAL SUPPLY — 57 items
ADH SKN CLS APL DERMABOND .7 (GAUZE/BANDAGES/DRESSINGS)
APL SKNCLS STERI-STRIP NONHPOA (GAUZE/BANDAGES/DRESSINGS) ×1
APL SRG 38 LTWT LNG FL B (MISCELLANEOUS)
APPLICATOR ARISTA FLEXITIP XL (MISCELLANEOUS) IMPLANT
APPLIER CLIP 5 13 M/L LIGAMAX5 (MISCELLANEOUS) ×3
APPLIER CLIP ROT 10 11.4 M/L (STAPLE)
APR CLP MED LRG 11.4X10 (STAPLE)
APR CLP MED LRG 5 ANG JAW (MISCELLANEOUS) ×1
BAG SPEC RTRVL 10 TROC 200 (ENDOMECHANICALS) ×1
BANDAGE ADH SHEER 1  50/CT (GAUZE/BANDAGES/DRESSINGS) ×12 IMPLANT
BENZOIN TINCTURE PRP APPL 2/3 (GAUZE/BANDAGES/DRESSINGS) ×2 IMPLANT
CABLE HIGH FREQUENCY MONO STRZ (ELECTRODE) ×3 IMPLANT
CHLORAPREP W/TINT 26ML (MISCELLANEOUS) ×3 IMPLANT
CLIP APPLIE 5 13 M/L LIGAMAX5 (MISCELLANEOUS) ×1 IMPLANT
CLIP APPLIE ROT 10 11.4 M/L (STAPLE) IMPLANT
CLIP LIGATING HEMO O LOK GREEN (MISCELLANEOUS) ×3 IMPLANT
CLOSURE STERI-STRIP 1/4X4 (GAUZE/BANDAGES/DRESSINGS) ×2 IMPLANT
CLOSURE WOUND 1/2 X4 (GAUZE/BANDAGES/DRESSINGS)
COVER MAYO STAND STRL (DRAPES) IMPLANT
COVER SURGICAL LIGHT HANDLE (MISCELLANEOUS) ×3 IMPLANT
DECANTER SPIKE VIAL GLASS SM (MISCELLANEOUS) ×3 IMPLANT
DERMABOND ADVANCED (GAUZE/BANDAGES/DRESSINGS)
DERMABOND ADVANCED .7 DNX12 (GAUZE/BANDAGES/DRESSINGS) IMPLANT
DEVICE PMI PUNCTURE CLOSURE (MISCELLANEOUS) ×2 IMPLANT
DRAPE C-ARM 42X120 X-RAY (DRAPES) IMPLANT
DRSG TEGADERM 2-3/8X2-3/4 SM (GAUZE/BANDAGES/DRESSINGS) IMPLANT
ELECT PENCIL ROCKER SW 15FT (MISCELLANEOUS) IMPLANT
ELECT REM PT RETURN 15FT ADLT (MISCELLANEOUS) ×3 IMPLANT
GAUZE SPONGE 2X2 8PLY STRL LF (GAUZE/BANDAGES/DRESSINGS) IMPLANT
GLOVE BIO SURGEON STRL SZ7.5 (GLOVE) ×3 IMPLANT
GLOVE INDICATOR 8.0 STRL GRN (GLOVE) ×3 IMPLANT
GOWN STRL REUS W/TWL XL LVL3 (GOWN DISPOSABLE) ×9 IMPLANT
GRASPER SUT TROCAR 14GX15 (MISCELLANEOUS) IMPLANT
HEMOSTAT ARISTA ABSORB 3G PWDR (MISCELLANEOUS) IMPLANT
HEMOSTAT SNOW SURGICEL 2X4 (HEMOSTASIS) IMPLANT
IRRIG SUCT STRYKERFLOW 2 WTIP (MISCELLANEOUS) ×3
IRRIGATION SUCT STRKRFLW 2 WTP (MISCELLANEOUS) ×1 IMPLANT
KIT BASIN OR (CUSTOM PROCEDURE TRAY) ×3 IMPLANT
L-HOOK LAP DISP 36CM (ELECTROSURGICAL)
LHOOK LAP DISP 36CM (ELECTROSURGICAL) IMPLANT
POUCH RETRIEVAL ECOSAC 10 (ENDOMECHANICALS) ×1 IMPLANT
POUCH RETRIEVAL ECOSAC 10MM (ENDOMECHANICALS) ×2
SCISSORS LAP 5X35 DISP (ENDOMECHANICALS) ×3 IMPLANT
SET CHOLANGIOGRAPH MIX (MISCELLANEOUS) IMPLANT
SLEEVE XCEL OPT CAN 5 100 (ENDOMECHANICALS) ×6 IMPLANT
SPONGE GAUZE 2X2 STER 10/PKG (GAUZE/BANDAGES/DRESSINGS)
STRIP CLOSURE SKIN 1/2X4 (GAUZE/BANDAGES/DRESSINGS) IMPLANT
SUT MNCRL AB 4-0 PS2 18 (SUTURE) ×3 IMPLANT
SUT VICRYL 0 TIES 12 18 (SUTURE) ×2 IMPLANT
SUT VICRYL 0 UR6 27IN ABS (SUTURE) IMPLANT
TOWEL OR 17X26 10 PK STRL BLUE (TOWEL DISPOSABLE) ×3 IMPLANT
TOWEL OR NON WOVEN STRL DISP B (DISPOSABLE) ×3 IMPLANT
TRAY LAPAROSCOPIC (CUSTOM PROCEDURE TRAY) ×3 IMPLANT
TROCAR BLADELESS OPT 5 100 (ENDOMECHANICALS) ×3 IMPLANT
TROCAR XCEL BLUNT TIP 100MML (ENDOMECHANICALS) IMPLANT
TROCAR XCEL NON-BLD 11X100MML (ENDOMECHANICALS) IMPLANT
TUBING INSUF HEATED (TUBING) ×3 IMPLANT

## 2017-06-30 NOTE — Anesthesia Postprocedure Evaluation (Signed)
Anesthesia Post Note  Patient: Bryan RudChristopher D Harvey  Procedure(s) Performed: Procedure(s) (LRB): LAPAROSCOPIC CHOLECYSTECTOMY (N/A)     Patient location during evaluation: PACU Anesthesia Type: General Level of consciousness: awake Pain management: pain level controlled Vital Signs Assessment: post-procedure vital signs reviewed and stable Respiratory status: spontaneous breathing Cardiovascular status: stable Postop Assessment: no signs of nausea or vomiting Anesthetic complications: no    Last Vitals:  Vitals:   06/30/17 1245 06/30/17 1255  BP: 124/81 (!) 129/11  Pulse: 76 79  Resp: 17 16  Temp: 37 C 36.8 C  SpO2: 93% 93%    Last Pain:  Vitals:   06/30/17 1245  TempSrc:   PainSc: 4                  Nirel Babler

## 2017-06-30 NOTE — H&P (Signed)
Bryan Harvey 06/17/2017 1:30 PM Location: [facility] Patient #: [id number] DOB: 05/15/1966 Married / Language: [place] / Race: White Male   History of Present Illness Bryan Harvey([place] M. Kyree Adriano MD; 06/17/2017 1:58 PM) The patient is a 51 year old male who presents for evaluation of gall stones. He is referred by Dr Benjamin Stainhekkekandam for evaluation of gallbladder problems. He states a few Sundays ago he and his family were having dinner and eating pizza and he subsequently developed right upper quadrant pain that radiated to his back. It was associated with nausea. It initially presented as indigestion and that's what he thought it was. The more intense discomfort improved but he still had some mild discomfort so he ended up seeing his PCP. Ultrasound was ordered which demonstrated cholelithiasis. Blood work revealed a normal metabolic panel and CBC. He has had several additional mild episodes especially after eating greasy food. He is trying to modify his diet. The discomfort is always there but it will worsen especially after eating greasy foods and last for several hours. 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 He has had numerous back surgeries without any issues with anesthesia. He does not smoke. He denies any melena, hematochezia, acholic stools, hematemesis, weight loss   Problem List/Past Medical Bryan Harvey(Branae Crail M. Andrey CampanileWilson, MD; 06/17/2017 1:58 PM) SYMPTOMATIC CHOLELITHIASIS (K80.20)   Past Surgical History Bryan Harvey([place] M. Andrey CampanileWilson, MD; 06/17/2017 1:49 PM) Hip Surgery  Left. Knee Surgery  Left. Oral Surgery  Shoulder Surgery  Left. Spinal Surgery - Lower Back  Vasectomy   Diagnostic Studies History Bryan Harvey(Randol Zumstein M. Andrey CampanileWilson, MD; 06/17/2017 1:49 PM) Colonoscopy  1-5 years ago  Allergies (Janette Ranson, CMA; 06/17/2017 1:30 PM) No Known Drug Allergies 06/17/2017  Medication History (Janette Ranson, CMA; 06/17/2017 1:32 PM) Gabapentin (300MG  Capsule, Oral) Active. Modafinil (200MG   Tablet, Oral) Active. Omeprazole (20MG  Tablet DR, Oral) Active. Medications Reconciled  Social History Bryan Harvey(Sherin Murdoch M. Andrey CampanileWilson, MD; 06/17/2017 1:49 PM) Alcohol use  Occasional alcohol use. Caffeine use  Carbonated beverages, Coffee. No drug use  Tobacco use  Never smoker.  Family History Bryan Harvey(Yena Tisby M. Andrey CampanileWilson, MD; 06/17/2017 1:49 PM) Arthritis  Father. Colon Cancer  Mother. Diabetes Mellitus  Mother. Heart Disease  Mother. Migraine Headache  Mother, Sister. Prostate Cancer  Father.  Other Problems Bryan Harvey(Terrace Fontanilla M. Andrey CampanileWilson, MD; 06/17/2017 1:58 PM) Asthma  Back Pain  Cholelithiasis  Gastroesophageal Reflux Disease  Hypercholesterolemia     Review of Systems Bryan Harvey(Yvana Samonte M. Andilyn Bettcher MD; 06/17/2017 1:49 PM) General Present- Appetite Loss. Not Present- Chills, Fatigue, Fever, Night Sweats, Weight Gain and Weight Loss. Skin Present- Dryness. Not Present- Change in Wart/Mole, Hives, Jaundice, New Lesions, Non-Healing Wounds, Rash and Ulcer. HEENT Not Present- Earache, Hearing Loss, Hoarseness, Nose Bleed, Oral Ulcers, Ringing in the Ears, Seasonal Allergies, Sinus Pain, Sore Throat, Visual Disturbances, Wears glasses/contact lenses and Yellow Eyes. Respiratory Not Present- Bloody sputum, Chronic Cough, Difficulty Breathing, Snoring and Wheezing. Breast Not Present- Breast Mass, Breast Pain, Nipple Discharge and Skin Changes. Cardiovascular Not Present- Chest Pain, Difficulty Breathing Lying Down, Leg Cramps, Palpitations, Rapid Heart Rate, Shortness of Breath and Swelling of Extremities. Gastrointestinal Present- Abdominal Pain, Indigestion and Nausea. Not Present- Bloating, Bloody Stool, Change in Bowel Habits, Chronic diarrhea, Constipation, Difficulty Swallowing, Excessive gas, Gets full quickly at meals, Hemorrhoids, Rectal Pain and Vomiting. Male Genitourinary Not Present- Blood in Urine, Change in Urinary Stream, Frequency, Impotence, Nocturia, Painful Urination, Urgency and Urine  Leakage. Musculoskeletal Present- Back Pain. Not Present- Joint Pain, Joint Stiffness, Muscle Pain, Muscle Weakness and Swelling of  Extremities. Neurological Not Present- Decreased Memory, Fainting, Headaches, Numbness, Seizures, Tingling, Tremor, Trouble walking and Weakness. Psychiatric Not Present- Anxiety, Bipolar, Change in Sleep Pattern, Depression, Fearful and Frequent crying. Endocrine Not Present- Cold Intolerance, Excessive Hunger, Hair Changes, Heat Intolerance, Hot flashes and New Diabetes. Hematology Not Present- Blood Thinners, Easy Bruising, Excessive bleeding, Gland problems, HIV and Persistent Infections.  Vitals (Janette Ranson CMA; 06/17/2017 1:32 PM) 06/17/2017 1:32 PM Weight: 296.8 lb Height: 76in Body Surface Area: 2.62 m Body Mass Index: 36.13 kg/m  Pulse: 70 (Regular)  BP: 110/80 (Sitting, Left Arm, Standard)       Physical Exam Bryan Harvey M. Marthe Dant MD; 06/17/2017 1:56 PM) General Mental Status-Alert. General Appearance-Consistent with stated age. Hydration-Well hydrated. Voice-Normal.  Head and Neck Head-normocephalic, atraumatic with no lesions or palpable masses. Trachea-midline. Thyroid Gland Characteristics - normal size and consistency.  Eye Eyeball - Bilateral-Extraocular movements intact. Sclera/Conjunctiva - Bilateral-No scleral icterus.  ENMT Note: ears - normal external ears mouth - lips intact   Chest and Lung Exam Chest and lung exam reveals -quiet, even and easy respiratory effort with no use of accessory muscles and on auscultation, normal breath sounds, no adventitious sounds and normal vocal resonance. Inspection Chest Wall - Normal. Back - normal.  Breast - Did not examine.  Cardiovascular Cardiovascular examination reveals -normal heart sounds, regular rate and rhythm with no murmurs and normal pedal pulses bilaterally.  Abdomen Inspection Inspection of the abdomen reveals - No Hernias. Skin - Scar  - no surgical scars. Palpation/Percussion Palpation and Percussion of the abdomen reveal - Soft, Non Tender, No Rebound tenderness, No Rigidity (guarding) and No hepatosplenomegaly. Auscultation Auscultation of the abdomen reveals - Bowel sounds normal.  Peripheral Vascular Upper Extremity Palpation - Pulses bilaterally normal.  Neurologic Neurologic evaluation reveals -alert and oriented x 3 with no impairment of recent or remote memory. Mental Status-Normal.  Neuropsychiatric The patient's mood and affect are described as -normal. Judgment and Insight-insight is appropriate concerning matters relevant to self.  Musculoskeletal Normal Exam - Left-Upper Extremity Strength Normal and Lower Extremity Strength Normal. Normal Exam - Right-Upper Extremity Strength Normal and Lower Extremity Strength Normal.  Lymphatic Head & Neck  General Head & Neck Lymphatics: Bilateral - Description - Normal. Axillary - Did not examine. Femoral & Inguinal - Did not examine.    Assessment & Plan Bryan Harvey M. Callen Vancuren MD; 06/17/2017 1:55 PM) SYMPTOMATIC CHOLELITHIASIS (K80.20) Impression: I believe the patient's symptoms are consistent with gallbladder disease.  We discussed gallbladder disease. The patient was given Agricultural engineer. We discussed non-operative and operative management. We discussed the signs & symptoms of acute cholecystitis  I discussed laparoscopic cholecystectomy with IOC in detail. The patient was given educational material as well as diagrams detailing the procedure. We discussed the risks and benefits of a laparoscopic cholecystectomy including, but not limited to bleeding, infection, injury to surrounding structures such as the intestine or liver, bile leak, retained gallstones, need to convert to an open procedure, prolonged diarrhea, blood clots such as DVT, common bile duct injury, anesthesia risks, and possible need for additional procedures. We discussed the  typical post-operative recovery course. I explained that the likelihood of improvement of their symptoms is good.  The patient has elected to proceed with surgery. Current Plans Pt Education - Pamphlet Given - Laparoscopic Gallbladder Surgery: discussed with patient and provided information.  Mary Sella. Andrey Campanile, MD, FACS General, Bariatric, & Minimally Invasive Surgery [facility], [place]

## 2017-06-30 NOTE — Anesthesia Procedure Notes (Signed)
Procedure Name: Intubation Date/Time: 06/30/2017 10:29 AM Performed by: Thornell MuleSTUBBLEFIELD, Jazelle Achey G Pre-anesthesia Checklist: Patient identified, Emergency Drugs available, Suction available and Patient being monitored Patient Re-evaluated:Patient Re-evaluated prior to induction Oxygen Delivery Method: Circle system utilized Preoxygenation: Pre-oxygenation with 100% oxygen Induction Type: IV induction Ventilation: Mask ventilation without difficulty Laryngoscope Size: Miller and 3 Grade View: Grade I Tube type: Oral Tube size: 7.5 mm Number of attempts: 1 Airway Equipment and Method: Stylet and Oral airway Placement Confirmation: ETT inserted through vocal cords under direct vision,  positive ETCO2 and breath sounds checked- equal and bilateral Secured at: 23 cm Tube secured with: Tape Dental Injury: Teeth and Oropharynx as per pre-operative assessment

## 2017-06-30 NOTE — Discharge Instructions (Signed)
General Anesthesia, Adult, Care After These instructions provide you with information about caring for yourself after your procedure. Your health care provider may also give you more specific instructions. Your treatment has been planned according to current medical practices, but problems sometimes occur. Call your health care provider if you have any problems or questions after your procedure. What can I expect after the procedure? After the procedure, it is common to have:  Vomiting.  A sore throat.  Mental slowness.  It is common to feel:  Nauseous.  Cold or shivery.  Sleepy.  Tired.  Sore or achy, even in parts of your body where you did not have surgery.  Follow these instructions at home: For at least 24 hours after the procedure:  Do not: ? Participate in activities where you could fall or become injured. ? Drive. ? Use heavy machinery. ? Drink alcohol. ? Take sleeping pills or medicines that cause drowsiness. ? Make important decisions or sign legal documents. ? Take care of children on your own.  Rest. Eating and drinking  If you vomit, drink water, juice, or soup when you can drink without vomiting.  Drink enough fluid to keep your urine clear or pale yellow.  Make sure you have little or no nausea before eating solid foods.  Follow the diet recommended by your health care provider. General instructions  Have a responsible adult stay with you until you are awake and alert.  Return to your normal activities as told by your health care provider. Ask your health care provider what activities are safe for you.  Take over-the-counter and prescription medicines only as told by your health care provider.  If you smoke, do not smoke without supervision.  Keep all follow-up visits as told by your health care provider. This is important. Contact a health care provider if:  You continue to have nausea or vomiting at home, and medicines are not helpful.  You  cannot drink fluids or start eating again.  You cannot urinate after 8-12 hours.  You develop a skin rash.  You have fever.  You have increasing redness at the site of your procedure. Get help right away if:  You have difficulty breathing.  You have chest pain.  You have unexpected bleeding.  You feel that you are having a life-threatening or urgent problem. This information is not intended to replace advice given to you by your health care provider. Make sure you discuss any questions you have with your health care provider. Document Released: 02/14/2001 Document Revised: 04/12/2016 Document Reviewed: 10/23/2015 Elsevier Interactive Patient Education  2018 [employer] [facility] LAPAROSCOPIC SURGERY: POST OP INSTRUCTIONS Always review your discharge instruction sheet given to you by the facility where your surgery was performed. IF YOU HAVE DISABILITY OR FAMILY LEAVE FORMS, YOU MUST BRING THEM TO THE OFFICE FOR PROCESSING.   DO NOT GIVE THEM TO YOUR DOCTOR.  1. A prescription for pain medication may be given to you upon discharge.  Take your pain medication as prescribed, if needed if tylenol and/or ibuprofen do not control your pain.  If narcotic pain medicine is not needed, then you may take acetaminophen (Tylenol) or ibuprofen (Advil) as needed. 2. Take your usually prescribed medications unless otherwise directed. 3. If you need a refill on your pain medication, please contact your pharmacy.  They will contact our office to request authorization. Prescriptions will not be filled after 5pm or on week-ends. 4. You should follow a light diet the  first few days after arrival home, such as soup and crackers, etc.  Be sure to include lots of fluids daily. 5. Most patients will experience some swelling and bruising in the area of the incisions.  Ice packs will help.  Swelling and bruising can take several days to resolve.  6. It is common to experience some  constipation if taking pain medication after surgery.  Increasing fluid intake and taking a stool softener (such as Colace) will usually help or prevent this problem from occurring.  A mild laxative (Milk of Magnesia or Miralax) should be taken according to package instructions if there are no bowel movements after 48 hours. 7. Unless discharge instructions indicate otherwise, you may remove your bandages 48 hours after surgery, and you may shower at that time.  You  have steri-strips (small skin tapes) in place directly over the incision.  These strips should be left on the skin for 7-10 days.  8. ACTIVITIES:  You may resume regular (light) daily activities beginning the next day--such as daily self-care, walking, climbing stairs--gradually increasing activities as tolerated.  You may have sexual intercourse when it is comfortable.  Refrain from any heavy lifting or straining until approved by your doctor. a. You may drive when you are no longer taking prescription pain medication, you can comfortably wear a seatbelt, and you can safely maneuver your car and apply brakes. 9. You should see your doctor in the office for a follow-up appointment approximately 2-3 weeks after your surgery.  Make sure that you call for this appointment within a day or two after you arrive home to insure a convenient appointment time. 10. OTHER INSTRUCTIONS:   WHEN TO CALL YOUR DOCTOR: 1. Fever over 101.0 2. Inability to urinate 3. Continued bleeding from incision. 4. Increased pain, redness, or drainage from the incision. 5. Increasing abdominal pain  The clinic staff is available to answer your questions during regular business hours.  Please dont hesitate to call and ask to speak to one of the nurses for clinical concerns.  If you have a medical emergency, go to the nearest emergency room or call 911.  A surgeon from [facility] is always on call at the hospital. [address], [address],  [place], [place]  [address] ? [address] [address], [place], [place]   [address] [phone] ? [phone] ? FAX [phone] Web site: [email or web address]

## 2017-06-30 NOTE — Transfer of Care (Signed)
Immediate Anesthesia Transfer of Care Note  Patient: Bryan Harvey  Procedure(s) Performed: Procedure(s): LAPAROSCOPIC CHOLECYSTECTOMY (N/A)  Patient Location: PACU  Anesthesia Type:General  Level of Consciousness: awake, alert  and oriented  Airway & Oxygen Therapy: Patient Spontanous Breathing and Patient connected to face mask oxygen  Post-op Assessment: Report given to RN and Post -op Vital signs reviewed and stable  Post vital signs: Reviewed and stable  Last Vitals:  Vitals:   06/30/17 0747  BP: (!) 132/93  Pulse: 75  Resp: 18  Temp: 37.1 C  SpO2: 97%    Last Pain:  Vitals:   06/30/17 0747  TempSrc: Oral      Patients Stated Pain Goal: 4 (06/30/17 0747)  Complications: No apparent anesthesia complications

## 2017-06-30 NOTE — Anesthesia Preprocedure Evaluation (Addendum)
Anesthesia Evaluation  Patient identified by MRN, date of birth, ID band Patient awake    Reviewed: Allergy & Precautions, NPO status , Patient's Chart, lab work & pertinent test results  History of Anesthesia Complications (+) PONV  Airway Mallampati: II  TM Distance: >3 FB     Dental   Pulmonary asthma ,    breath sounds clear to auscultation       Cardiovascular + Peripheral Vascular Disease   Rhythm:Regular Rate:Normal     Neuro/Psych    GI/Hepatic Neg liver ROS, GERD  ,  Endo/Other  negative endocrine ROS  Renal/GU negative Renal ROS     Musculoskeletal  (+) Arthritis ,   Abdominal   Peds  Hematology   Anesthesia Other Findings   Reproductive/Obstetrics                             Anesthesia Physical Anesthesia Plan  ASA: II  Anesthesia Plan: General   Post-op Pain Management:    Induction: Intravenous  PONV Risk Score and Plan: 3 and Ondansetron, Dexamethasone, Midazolam, Propofol infusion and Treatment may vary due to age or medical condition  Airway Management Planned:   Additional Equipment:   Intra-op Plan:   Post-operative Plan: Extubation in OR  Informed Consent: I have reviewed the patients History and Physical, chart, labs and discussed the procedure including the risks, benefits and alternatives for the proposed anesthesia with the patient or authorized representative who has indicated his/her understanding and acceptance.   Dental advisory given  Plan Discussed with: CRNA and Anesthesiologist  Anesthesia Plan Comments:         Anesthesia Quick Evaluation

## 2017-06-30 NOTE — Op Note (Signed)
Bryan RudChristopher D Gil [id number] 06/22/1966 06/30/2017  Laparoscopic Cholecystectomy  Procedure Note  Indications: This patient presents with symptomatic gallbladder disease and will undergo laparoscopic cholecystectomy.  Pre-operative Diagnosis: symptomatic cholelithiasis   Post-operative Diagnosis: Same  Surgeon: Atilano InaWILSON,Madelaine Whipple M   Assistants: none  Anesthesia: General endotracheal anesthesia  Procedure Details  The patient was seen again in the Holding Room. The risks, benefits, complications, treatment options, and expected outcomes were discussed with the patient. The possibilities of reaction to medication, pulmonary aspiration, perforation of viscus, bleeding, recurrent infection, finding a normal gallbladder, the need for additional procedures, failure to diagnose a condition, the possible need to convert to an open procedure, and creating a complication requiring transfusion or operation were discussed with the patient. The likelihood of improving the patient's symptoms with return to their baseline status is good.  The patient and/or family concurred with the proposed plan, giving informed consent. The site of surgery properly noted. The patient was taken to Operating Room, identified as Bryan Harvey and the procedure verified as Laparoscopic Cholecystectomy. A Time Out was held and the above information confirmed. Antibiotic prophylaxis was administered.   Prior to the induction of general anesthesia, antibiotic prophylaxis was administered. General endotracheal anesthesia was then administered and tolerated well. After the induction, the abdomen was prepped with Chloraprep and draped in the sterile fashion. The patient was positioned in the supine position.  Local anesthetic agent was injected into the skin near the umbilicus and an incision made. We dissected down to the abdominal fascia with blunt dissection.  The fascia was incised vertically and we entered the peritoneal  cavity bluntly.  A pursestring suture of 0-Vicryl was placed around the fascial opening.  The Hasson cannula was inserted and secured with the stay suture.  Pneumoperitoneum was then created with CO2 and tolerated well without any adverse changes in the patient's vital signs. An 5-mm port was placed in the subxiphoid position.  Two 5-mm ports were placed in the right upper quadrant. All skin incisions were infiltrated with a local anesthetic agent before making the incision and placing the trocars.   We positioned the patient in reverse Trendelenburg, tilted slightly to the patient's left.  The gallbladder was identified, the fundus grasped and retracted cephalad. Adhesions were lysed bluntly and with the electrocautery where indicated, taking care not to injure any adjacent organs or viscus. The gallbladder had about 2mm layer of adipose tissue around the infundibulum. The infundibulum was grasped and retracted laterally, exposing the peritoneum overlying the triangle of Calot. This was then divided and exposed in a blunt fashion. A critical view of the cystic duct and cystic artery was obtained.  The cystic duct was clearly identified and bluntly dissected circumferentially.   The cystic duct was then ligated with clips and divided. The cystic artery (both an anterior and posterior branch) which had been identified & dissected free was ligated with clips and divided as well.   The gallbladder was dissected from the liver bed in retrograde fashion with the electrocautery. The gallbladder was removed and placed in an Ecco sac.  The gallbladder and Ecco sac were then removed through the umbilical port site. The liver bed was irrigated and inspected. Hemostasis was achieved with the electrocautery. Copious irrigation was utilized and was repeatedly aspirated until clear.  The pursestring suture was used to close the umbilical fascia.  I did elect to place an interrupted 0 vicryl suture at the umbilicus with a  PMI suture passer.   We again inspected  the right upper quadrant for hemostasis.  The umbilical closure was inspected and there was no air leak and nothing trapped within the closure. Pneumoperitoneum was released as we removed the trocars.  4-0 Monocryl was used to close the skin.   Benzoin, steri-strips, and clean dressings were applied. The patient was then extubated and brought to the recovery room in stable condition. Instrument, sponge, and needle counts were correct at closure and at the conclusion of the case.   Findings: +critical view  Estimated Blood Loss: 25 ml         Drains: none         Specimens: Gallbladder           Complications: None; patient tolerated the procedure well.         Disposition: PACU - hemodynamically stable.         Condition: stable  Mary Sella. Andrey Campanile, MD, FACS General, Bariatric, & Minimally Invasive Surgery [facility], [place]

## 2017-06-30 NOTE — Interval H&P Note (Signed)
History and Physical Interval Note:  06/30/2017 9:37 AM  Bryan Harvey  has presented today for surgery, with the diagnosis of symptomatic cholelithiasis  The various methods of treatment have been discussed with the patient and family. After consideration of risks, benefits and other options for treatment, the patient has consented to  Procedure(s): LAPAROSCOPIC CHOLECYSTECTOMY (N/A) as a surgical intervention .  The patient's history has been reviewed, patient examined, no change in status, stable for surgery.  I have reviewed the patient's chart and labs.  Questions were answered to the patient's satisfaction.    Mary SellaEric M. Andrey CampanileWilson, MD, FACS General, Bariatric, & Minimally Invasive Surgery [facility], [place]   [facility] CenterWILSON,[name]

## 2017-07-01 ENCOUNTER — Encounter (HOSPITAL_COMMUNITY): Payer: Self-pay | Admitting: General Surgery

## 2017-07-29 ENCOUNTER — Other Ambulatory Visit: Payer: Self-pay | Admitting: Physician Assistant

## 2017-07-29 MED ORDER — TRIAMCINOLONE ACETONIDE 0.1 % EX CREA: 1.0000 "application " | TOPICAL_CREAM | Freq: Two times a day (BID) | CUTANEOUS | 1 refills | Status: DC | PRN

## 2017-08-11 ENCOUNTER — Encounter: Payer: Self-pay | Admitting: Sports Medicine

## 2017-08-11 ENCOUNTER — Ambulatory Visit (INDEPENDENT_AMBULATORY_CARE_PROVIDER_SITE_OTHER): Payer: 59

## 2017-08-11 ENCOUNTER — Ambulatory Visit (INDEPENDENT_AMBULATORY_CARE_PROVIDER_SITE_OTHER): Payer: 59 | Admitting: Sports Medicine

## 2017-08-11 DIAGNOSIS — R0602 Shortness of breath: Secondary | ICD-10-CM

## 2017-08-11 DIAGNOSIS — J209 Acute bronchitis, unspecified: Secondary | ICD-10-CM | POA: Diagnosis not present

## 2017-08-11 DIAGNOSIS — R05 Cough: Secondary | ICD-10-CM

## 2017-08-11 MED ORDER — HYDROCOD POLST-CPM POLST ER 10-8 MG/5ML PO SUER: 5.0000 mL | Freq: Two times a day (BID) | ORAL | 0 refills | Status: DC | PRN

## 2017-08-11 MED ORDER — AZITHROMYCIN 250 MG PO TABS: ORAL_TABLET | ORAL | 0 refills | Status: DC

## 2017-08-11 NOTE — Assessment & Plan Note (Signed)
Present for 10 days, azithromycin, chest x-ray, Tussionex.  Return if no better in a week or 2.

## 2017-08-11 NOTE — Progress Notes (Signed)
  Subjective:    CC: 10 day history of cough   HPI: the patient presents today with a 10 day history of cough that is occasionally productive with yellow/green mucous. He reports that the cough is worsening since its onset. He also reports post nasal drip. He states that he felt warm but he did not take his temperature. Patient reports sick contacts at work. He has tried taking mucinex, OTC cold and flu, and ibuprofen with limited relief. Denies sinus pain, congestion, and ear pain.  Past medical history:  Negative.  See flowsheet/record as well for more information.  Surgical history: Negative.  See flowsheet/record as well for more information.  Family history: Negative.  See flowsheet/record as well for more information.  Social history: Negative.  See flowsheet/record as well for more information.  Allergies, and medications have been entered into the medical record, reviewed, and no changes needed.   Review of Systems: No fevers, chills, night sweats.   Objective:    General: Well Developed, well nourished, and in no acute distress.  Neuro: Alert and oriented x3, extra-ocular muscles intact, sensation grossly intact.  HEENT: Normocephalic, atraumatic, pupils equal round reactive to light, neck supple, no masses, no lymphadenopathy, thyroid nonpalpable. Mild erythema of the posterior pharynx.  Skin: Warm and dry, no rashes. Cardiac: Regular rate and rhythm, no murmurs rubs or gallops, no lower extremity edema.  Respiratory: Clear to auscultation bilaterally. Not using accessory muscles, speaking in full sentences.   Impression and Recommendations:   I suspect acute bronchitis. Given the 10 day history, we will order an CXR.  Begin azithromycin and Tussionex.  Return to the office in a week or 2.   No problem-specific Assessment & Plan notes found for this encounter.   ___________________________________________ Ihor Austin. Benjamin Stain, M.D., ABFM., CAQSM. Primary Care and Sports  Medicine [facility]  Adjunct Instructor of Family Medicine  [employer]

## 2017-08-23 ENCOUNTER — Ambulatory Visit (INDEPENDENT_AMBULATORY_CARE_PROVIDER_SITE_OTHER): Payer: 59

## 2017-08-23 ENCOUNTER — Encounter: Payer: Self-pay | Admitting: Sports Medicine

## 2017-08-23 ENCOUNTER — Ambulatory Visit (INDEPENDENT_AMBULATORY_CARE_PROVIDER_SITE_OTHER): Payer: 59 | Admitting: Sports Medicine

## 2017-08-23 DIAGNOSIS — J209 Acute bronchitis, unspecified: Secondary | ICD-10-CM

## 2017-08-23 MED ORDER — PREDNISONE 50 MG PO TABS: 50.0000 mg | ORAL_TABLET | Freq: Every day | ORAL | 0 refills | Status: DC

## 2017-08-23 MED ORDER — BENZONATATE 200 MG PO CAPS: 200.0000 mg | ORAL_CAPSULE | Freq: Three times a day (TID) | ORAL | 0 refills | Status: DC | PRN

## 2017-08-23 MED ORDER — HYDROCOD POLST-CPM POLST ER 10-8 MG/5ML PO SUER: 5.0000 mL | Freq: Two times a day (BID) | ORAL | 0 refills | Status: DC | PRN

## 2017-08-23 MED ORDER — DOXYCYCLINE HYCLATE 100 MG PO TABS: 100.0000 mg | ORAL_TABLET | Freq: Two times a day (BID) | ORAL | 0 refills | Status: AC

## 2017-08-23 NOTE — Progress Notes (Signed)
  Subjective:    CC: Still sick  HPI: This is a pleasant 51 year old male, saw him 2 weeks ago after 10 days of cough, we treated him for an acute bronchitis, x-rays showed some bronchitic changes. He really never improved, worsening cough, worsening malaise. Multiple sick contacts in his office.  Past medical history:  Negative.  See flowsheet/record as well for more information.  Surgical history: Negative.  See flowsheet/record as well for more information.  Family history: Negative.  See flowsheet/record as well for more information.  Social history: Negative.  See flowsheet/record as well for more information.  Allergies, and medications have been entered into the medical record, reviewed, and no changes needed.   Review of Systems: No fevers, chills, night sweats, weight loss, chest pain, or shortness of breath.   Objective:    General: Well Developed, well nourished, and in no acute distress.  Neuro: Alert and oriented x3, extra-ocular muscles intact, sensation grossly intact.  HEENT: Normocephalic, atraumatic, pupils equal round reactive to light, neck supple, no masses, no lymphadenopathy, thyroid nonpalpable.  Skin: Warm and dry, no rashes. Cardiac: Regular rate and rhythm, no murmurs rubs or gallops, no lower extremity edema.  Respiratory: Clear to auscultation bilaterally. Not using accessory muscles, speaking in full sentences.  Impression and Recommendations:    Subacute bronchitis Persistence of symptoms, lungs sound good but cough seemingly is getting worse, also having significant systemic symptoms with malaise. This time adding prednisone, doxycycline, repeat chest x-ray, laboratory testing including Quantiferon Gold, tells me that someone at his work was born teen on an airplane. Refilling Tussionex and Tessalon Perles.  I spent 25 minutes with this patient, greater than 50% was face-to-face time counseling regarding the above  diagnoses ___________________________________________ Bryan Harvey. Benjamin Stain, M.D., ABFM., CAQSM. Primary Care and Sports Medicine [place] [facility]  Adjunct Instructor of Family Medicine  [employer]

## 2017-08-23 NOTE — Assessment & Plan Note (Signed)
Persistence of symptoms, lungs sound good but cough seemingly is getting worse, also having significant systemic symptoms with malaise. This time adding prednisone, doxycycline, repeat chest x-ray, laboratory testing including Quantiferon Gold, tells me that someone at his work was born teen on an airplane. Refilling Tussionex and Tessalon Perles.

## 2017-08-26 LAB — CBC WITH DIFFERENTIAL/PLATELET
Basophils Absolute: 58 cells/uL (ref 0–200)
Basophils Relative: 0.9 %
Eosinophils Absolute: 218 cells/uL (ref 15–500)
Eosinophils Relative: 3.4 %
HCT: 39.8 % (ref 38.5–50.0)
Hemoglobin: 13.7 g/dL (ref 13.2–17.1)
Lymphs Abs: 1293 cells/uL (ref 850–3900)
MCH: 29.1 pg (ref 27.0–33.0)
MCHC: 34.4 g/dL (ref 32.0–36.0)
MCV: 84.5 fL (ref 80.0–100.0)
MPV: 9 fL (ref 7.5–12.5)
Monocytes Relative: 8 %
Neutro Abs: 4320 cells/uL (ref 1500–7800)
Neutrophils Relative %: 67.5 %
Platelets: 384 Thousand/uL (ref 140–400)
RBC: 4.71 Million/uL (ref 4.20–5.80)
RDW: 13.1 % (ref 11.0–15.0)
Total Lymphocyte: 20.2 %
WBC mixed population: 512 cells/uL (ref 200–950)
WBC: 6.4 Thousand/uL (ref 3.8–10.8)

## 2017-08-26 LAB — COMPREHENSIVE METABOLIC PANEL
AG Ratio: 1.8 (calc) (ref 1.0–2.5)
ALT: 12 U/L (ref 9–46)
CO2: 21 mmol/L (ref 20–32)
Calcium: 9.2 mg/dL (ref 8.6–10.3)
Sodium: 139 mmol/L (ref 135–146)
Total Bilirubin: 0.3 mg/dL (ref 0.2–1.2)
Total Protein: 6.5 g/dL (ref 6.1–8.1)

## 2017-08-26 LAB — QUANTIFERON TB GOLD ASSAY (BLOOD)
Mitogen-Nil: >10 [IU]/mL
QUANTIFERON(R)-TB GOLD: NEGATIVE
Quantiferon Nil Value: 0.04 IU/mL
Quantiferon Tb Ag Minus Nil Value: 0 [IU]/mL

## 2017-08-26 LAB — COMPREHENSIVE METABOLIC PANEL WITH GFR
AST: 12 U/L (ref 10–35)
Albumin: 4.2 g/dL (ref 3.6–5.1)
Alkaline phosphatase (APISO): 95 U/L (ref 40–115)
BUN: 18 mg/dL (ref 7–25)
Chloride: 108 mmol/L (ref 98–110)
Creat: 0.92 mg/dL (ref 0.70–1.33)
Globulin: 2.3 g/dL (ref 1.9–3.7)
Glucose, Bld: 115 mg/dL — ABNORMAL HIGH (ref 65–99)
Potassium: 4.1 mmol/L (ref 3.5–5.3)

## 2017-09-20 ENCOUNTER — Ambulatory Visit: Payer: 59 | Admitting: Sports Medicine

## 2017-10-04 ENCOUNTER — Encounter: Payer: Self-pay | Admitting: Sports Medicine

## 2017-10-04 ENCOUNTER — Ambulatory Visit (INDEPENDENT_AMBULATORY_CARE_PROVIDER_SITE_OTHER): Payer: 59 | Admitting: Sports Medicine

## 2017-10-04 DIAGNOSIS — J209 Acute bronchitis, unspecified: Secondary | ICD-10-CM

## 2017-10-04 DIAGNOSIS — M5136 Other intervertebral disc degeneration, lumbar region: Secondary | ICD-10-CM

## 2017-10-04 DIAGNOSIS — Z23 Encounter for immunization: Secondary | ICD-10-CM | POA: Diagnosis not present

## 2017-10-04 DIAGNOSIS — M51369 Other intervertebral disc degeneration, lumbar region without mention of lumbar back pain or lower extremity pain: Secondary | ICD-10-CM

## 2017-10-04 MED ORDER — GABAPENTIN 600 MG PO TABS: 600.0000 mg | ORAL_TABLET | Freq: Three times a day (TID) | ORAL | 3 refills | Status: DC

## 2017-10-04 NOTE — Assessment & Plan Note (Signed)
This is finally resolved, flu shot as above. Return as needed.

## 2017-10-04 NOTE — Assessment & Plan Note (Signed)
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 Increasing gabapentin to 600 mg 3 times per day, we are going to go ahead and proceed with another MRI to further evaluate for reherniation with recurrence of symptoms after L3-L5 fusion. MRI will need to be with contrast. Return to go over MRI results, no bowel or bladder dysfunction, saddle numbness, or constitutional symptoms.

## 2017-10-04 NOTE — Progress Notes (Signed)
  Subjective:    CC: Follow-up  HPI: This is a pleasant 51 year old male, we treated him for subacute bronchitis, ultimately he needed a couple of courses of antibiotics but returns today with symptoms totally resolved.  Unfortunately he is complaining of recurrence of back pain with radiation down both legs, in an L5 distribution.  He has a history of an L4-L5 fusion/TLIF, did well but then developed recurrence of pain, he also had an L3-L4 protruding disc, TLIF was extended to L3-L4.  Initially he did well for a year but now has a sense of the radicular pain, no bowel or bladder dysfunction, saddle numbness, or constitutional symptoms.  Past medical history:  Negative.  See flowsheet/record as well for more information.  Surgical history: Negative.  See flowsheet/record as well for more information.  Family history: Negative.  See flowsheet/record as well for more information.  Social history: Negative.  See flowsheet/record as well for more information.  Allergies, and medications have been entered into the medical record, reviewed, and no changes needed.   Review of Systems: No fevers, chills, night sweats, weight loss, chest pain, or shortness of breath.   Objective:    General: Well Developed, well nourished, and in no acute distress.  Neuro: Alert and oriented x3, extra-ocular muscles intact, sensation grossly intact.  HEENT: Normocephalic, atraumatic, pupils equal round reactive to light, neck supple, no masses, no lymphadenopathy, thyroid nonpalpable.  Skin: Warm and dry, no rashes. Cardiac: Regular rate and rhythm, no murmurs rubs or gallops, no lower extremity edema.  Respiratory: Clear to auscultation bilaterally. Not using accessory muscles, speaking in full sentences. Back Exam:  Inspection: Unremarkable  Motion: Flexion 45 deg, Extension 45 deg, Side Bending to 45 deg bilaterally,  Rotation to 45 deg bilaterally  SLR laying: Negative  XSLR laying: Negative  Palpable  tenderness: None. FABER: negative. Sensory change: Gross sensation intact to all lumbar and sacral dermatomes.  Reflexes: 2+ at both patellar tendons, 2+ at achilles tendons, Babinski's downgoing.  Strength at foot  Plantar-flexion: 5/5 Dorsi-flexion: 5/5 Eversion: 5/5 Inversion: 5/5  Leg strength  Quad: 5/5 Hamstring: 5/5 Hip flexor: 5/5 Hip abductors: 5/5  Gait unremarkable.  Impression and Recommendations:    Subacute bronchitis This is finally resolved, flu shot as above. Return as needed.  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 Increasing gabapentin to 600 mg 3 times per day, we are going to go ahead and proceed with another MRI to further evaluate for reherniation with recurrence of symptoms after L3-L5 fusion. MRI will need to be with contrast. Return to go over MRI results, no bowel or bladder dysfunction, saddle numbness, or constitutional symptoms.  ___________________________________________ Ihor Austinhomas J. Benjamin Stainhekkekandam, M.D., ABFM., CAQSM. Primary Care and Sports Medicine [place] [facility]  Adjunct Instructor of Family Medicine  [employer]

## 2017-10-17 ENCOUNTER — Ambulatory Visit (INDEPENDENT_AMBULATORY_CARE_PROVIDER_SITE_OTHER): Payer: 59

## 2017-10-17 DIAGNOSIS — M5136 Other intervertebral disc degeneration, lumbar region: Secondary | ICD-10-CM

## 2017-10-17 MED ORDER — GADOBENATE DIMEGLUMINE 529 MG/ML IV SOLN
20.0000 mL | Freq: Once | INTRAVENOUS | Status: AC | PRN
  Administered 2017-10-17: 20 mL via INTRAVENOUS

## 2017-11-28 ENCOUNTER — Other Ambulatory Visit: Payer: Self-pay | Admitting: Physician Assistant

## 2017-11-28 DIAGNOSIS — G4726 Circadian rhythm sleep disorder, shift work type: Secondary | ICD-10-CM

## 2017-11-28 NOTE — Telephone Encounter (Signed)
Sent to pcp for signature.Kollen Armenti Lynetta  

## 2017-11-29 ENCOUNTER — Other Ambulatory Visit: Payer: Self-pay | Admitting: *Deleted

## 2017-11-29 MED ORDER — ARMODAFINIL 200 MG PO TABS: 200.0000 mg | ORAL_TABLET | Freq: Every day | ORAL | 1 refills | Status: DC

## 2017-12-13 ENCOUNTER — Encounter: Payer: Self-pay | Admitting: Physician Assistant

## 2017-12-13 ENCOUNTER — Ambulatory Visit: Payer: 59 | Admitting: Physician Assistant

## 2017-12-13 VITALS — BP 117/67 | HR 83 | Ht 76.0 in | Wt 294.0 lb

## 2017-12-13 DIAGNOSIS — Z131 Encounter for screening for diabetes mellitus: Secondary | ICD-10-CM

## 2017-12-13 DIAGNOSIS — M5136 Other intervertebral disc degeneration, lumbar region: Secondary | ICD-10-CM

## 2017-12-13 DIAGNOSIS — E782 Mixed hyperlipidemia: Secondary | ICD-10-CM

## 2017-12-13 DIAGNOSIS — G4726 Circadian rhythm sleep disorder, shift work type: Secondary | ICD-10-CM

## 2017-12-13 DIAGNOSIS — Z0000 Encounter for general adult medical examination without abnormal findings: Secondary | ICD-10-CM | POA: Diagnosis not present

## 2017-12-13 DIAGNOSIS — G894 Chronic pain syndrome: Secondary | ICD-10-CM | POA: Diagnosis not present

## 2017-12-13 DIAGNOSIS — M5416 Radiculopathy, lumbar region: Secondary | ICD-10-CM | POA: Diagnosis not present

## 2017-12-13 DIAGNOSIS — G039 Meningitis, unspecified: Secondary | ICD-10-CM | POA: Diagnosis not present

## 2017-12-13 LAB — COMPLETE METABOLIC PANEL WITH GFR
AG Ratio: 1.7 (calc) (ref 1.0–2.5)
ALT: 29 U/L (ref 9–46)
AST: 18 U/L (ref 10–35)
Albumin: 4.3 g/dL (ref 3.6–5.1)
Alkaline phosphatase (APISO): 102 U/L (ref 40–115)
BUN: 24 mg/dL (ref 7–25)
CALCIUM: 9.6 mg/dL (ref 8.6–10.3)
CO2: 27 mmol/L (ref 20–32)
CREATININE: 1.09 mg/dL (ref 0.70–1.33)
Chloride: 107 mmol/L (ref 98–110)
GFR, EST NON AFRICAN AMERICAN: 79 mL/min/{1.73_m2} (ref >=60)
GFR, Est African American: 91 mL/min/{1.73_m2} (ref >=60)
GLUCOSE: 100 mg/dL — AB (ref 65–99)
Globulin: 2.5 g/dL (calc) (ref 1.9–3.7)
Potassium: 4.2 mmol/L (ref 3.5–5.3)
Sodium: 140 mmol/L (ref 135–146)
Total Bilirubin: 0.5 mg/dL (ref 0.2–1.2)
Total Protein: 6.8 g/dL (ref 6.1–8.1)

## 2017-12-13 LAB — CBC
HEMATOCRIT: 41.2 % (ref 38.5–50.0)
Hemoglobin: 14.3 g/dL (ref 13.2–17.1)
MCH: 29.6 pg (ref 27.0–33.0)
MCHC: 34.7 g/dL (ref 32.0–36.0)
MCV: 85.3 fL (ref 80.0–100.0)
MPV: 9.2 fL (ref 7.5–12.5)
PLATELETS: 352 10*3/uL (ref 140–400)
RBC: 4.83 10*6/uL (ref 4.20–5.80)
RDW: 12.5 % (ref 11.0–15.0)
WBC: 7 10*3/uL (ref 3.8–10.8)

## 2017-12-13 LAB — LIPID PANEL W/REFLEX DIRECT LDL
Cholesterol: 283 mg/dL — ABNORMAL HIGH (ref <200)
HDL: 45 mg/dL (ref >40)
LDL Cholesterol (Calc): 186 mg/dL (calc) — ABNORMAL HIGH
NON-HDL CHOLESTEROL (CALC): 238 mg/dL — AB (ref <130)
Total CHOL/HDL Ratio: 6.3 (calc) — ABNORMAL HIGH (ref <5.0)
Triglycerides: 306 mg/dL — ABNORMAL HIGH (ref <150)

## 2017-12-13 LAB — PSA: PSA: 1.5 ng/mL (ref <=4.0)

## 2017-12-13 MED ORDER — DULOXETINE HCL 30 MG PO CPEP: 30.0000 mg | ORAL_CAPSULE | Freq: Every day | ORAL | 1 refills | Status: DC

## 2017-12-13 NOTE — Progress Notes (Signed)
Subjective:    Patient ID: Bryan Harvey, male    DOB: 12/20/1965, 52 y.o.   MRN: [id number]  HPI  Pt is a 52 yo male with hyperlipidemia, GERD, lumbar radiculopathy who presents to the clinic for follow up.   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 No problems or concerns.   He continues to have lumbar radiculopathy pain. He is on gabapentin. He takes OTC NSAIDs. Prescriptions have not worked well in past such as celebrex, mobic, diclofenac.   Hx of hyperlipidemia-not on medication. Had rhabomyolysis due to statins.  .. Family History  Problem Relation Age of Onset  . Lymphoma Father   . Diabetes Mother       Review of Systems  All other systems reviewed and are negative.      Objective:   Physical Exam  Constitutional: He is oriented to person, place, and time. He appears well-developed and well-nourished.  HENT:  Head: Normocephalic and atraumatic.  Cardiovascular: Normal rate, regular rhythm and normal heart sounds.  Pulmonary/Chest: Effort normal and breath sounds normal. He has no wheezes.  Neurological: He is alert and oriented to person, place, and time.  Psychiatric: He has a normal mood and affect. His behavior is normal.          Assessment & Plan:  Marland Kitchen.Marland Kitchen.Diagnoses and all orders for this visit:  Shift work sleep disorder  Mixed hyperlipidemia -     Lipid Panel w/reflex Direct LDL  Screening for diabetes mellitus -     COMPLETE METABOLIC PANEL WITH GFR  Encounter for screening and preventative care -     Lipid Panel w/reflex Direct LDL -     COMPLETE METABOLIC PANEL WITH GFR -     CBC -     PSA  Lumbar degenerative disc disease -     DULoxetine (CYMBALTA) 30 MG capsule; Take 1 capsule (30 mg total) by mouth daily.  Lumbar radiculopathy -     DULoxetine (CYMBALTA) 30 MG capsule; Take 1 capsule (30 mg total) by mouth daily.  Arachnoiditis -     DULoxetine (CYMBALTA) 30 MG capsule; Take 1 capsule (30 mg  total) by mouth daily.  Chronic pain syndrome -     DULoxetine (CYMBALTA) 30 MG capsule; Take 1 capsule (30 mg total) by mouth daily.   . [facility] 12/13/2017 05/06/2017  Decreased Interest 0 0  Down, Depressed, Hopeless 0 0  PHQ - 2 Score 0 0   Doing well on armodafinil. Will refill as needed.   Continue gabapentin. Start Cymbalta. Discussed side effects. Follow up as needed in 6 months. Could certainly titrate up to higher dose if patient tolerates and helping with pain.   Fasting/screening labs ordered today. We may need to consider other medications for cholesterol to lower his risk. No family hx of CAD, MI, stroke.   Marland Kitchen..IPSS Questionnaire (AUA-7): Over the past month.   1)  How often have you had a sensation of not emptying your bladder completely after you finish urinating?  0 - Not at all  2)  How often have you had to urinate again less than two hours after you finished urinating? 0 - Not at all  3)  How often have you found you stopped and started again several times when you urinated?  0 - Not at all  4) How difficult have you found it to postpone urination?  0 - Not at all  5) How often have you  had a weak urinary stream?  0 - Not at all  6) How often have you had to push or strain to begin urination?  0 - Not at all  7) How many times did you most typically get up to urinate from the time you went to bed until the time you got up in the morning?  0 - None  Total score:  0-7 mildly symptomatic   8-19 moderately symptomatic   20-35 severely symptomatic

## 2017-12-14 ENCOUNTER — Encounter: Payer: Self-pay | Admitting: Physician Assistant

## 2017-12-14 DIAGNOSIS — E781 Pure hyperglyceridemia: Secondary | ICD-10-CM | POA: Insufficient documentation

## 2017-12-21 ENCOUNTER — Encounter: Payer: Self-pay | Admitting: Physician Assistant

## 2017-12-21 MED ORDER — COLESEVELAM HCL 625 MG PO TABS: 1875.0000 mg | ORAL_TABLET | Freq: Two times a day (BID) | ORAL | 1 refills | Status: DC

## 2017-12-23 ENCOUNTER — Ambulatory Visit: Payer: 59

## 2017-12-27 ENCOUNTER — Encounter: Payer: Self-pay | Admitting: Physician Assistant

## 2017-12-27 ENCOUNTER — Ambulatory Visit (INDEPENDENT_AMBULATORY_CARE_PROVIDER_SITE_OTHER): Payer: 59 | Admitting: Physician Assistant

## 2017-12-27 VITALS — BP 127/74 | HR 79 | Ht 76.0 in | Wt 290.0 lb

## 2017-12-27 DIAGNOSIS — J0140 Acute pansinusitis, unspecified: Secondary | ICD-10-CM

## 2017-12-27 DIAGNOSIS — R05 Cough: Secondary | ICD-10-CM | POA: Diagnosis not present

## 2017-12-27 DIAGNOSIS — R059 Cough, unspecified: Secondary | ICD-10-CM

## 2017-12-27 MED ORDER — AMOXICILLIN-POT CLAVULANATE 875-125 MG PO TABS
1.0000 | ORAL_TABLET | Freq: Two times a day (BID) | ORAL | 0 refills | Status: DC
Start: 2017-12-27 — End: 2018-02-15

## 2017-12-27 MED ORDER — IPRATROPIUM BROMIDE 0.06 % NA SOLN: 2.0000 | Freq: Four times a day (QID) | NASAL | 1 refills | Status: DC

## 2017-12-27 MED ORDER — BENZONATATE 200 MG PO CAPS: 200.0000 mg | ORAL_CAPSULE | Freq: Two times a day (BID) | ORAL | 0 refills | Status: DC | PRN

## 2017-12-27 MED ORDER — HYDROCODONE-HOMATROPINE 5-1.5 MG/5ML PO SYRP: 5.0000 mL | ORAL_SOLUTION | Freq: Two times a day (BID) | ORAL | 0 refills | Status: DC | PRN

## 2017-12-27 NOTE — Patient Instructions (Signed)

## 2017-12-27 NOTE — Progress Notes (Addendum)
   Subjective:    Patient ID: Bryan Harvey, male    DOB: 01/21/1966, 52 y.o.   MRN: [id number]  HPI  Pt is a 52 year old male presenting to the clinic with congestion, post-nasal drip, productive cough with yellow mucous, sinus pressure, sinus pain, and headaches. He has had this since 12/17/17 and has had relief with Nyquil and Dayquil but the symptoms return after a few hours. Denies fever and shortness of breath.   Last visit we started him on Cymbalta which he reports has decreased his nerve pain.    Review of Systems  Constitutional: Positive for chills and fatigue. Negative for fever.  HENT: Positive for congestion, postnasal drip, sinus pressure, sinus pain and sore throat. Negative for rhinorrhea.   Respiratory: Positive for cough. Negative for shortness of breath.   Cardiovascular: Negative for chest pain and palpitations.  Neurological: Positive for headaches.       Objective:   Physical Exam  Constitutional: He is oriented to person, place, and time. He appears well-developed and well-nourished. No distress.  HENT:  Head: Normocephalic and atraumatic.  Right Ear: External ear normal.  Left Ear: External ear normal.  Nose: Right sinus exhibits maxillary sinus tenderness and frontal sinus tenderness. Left sinus exhibits maxillary sinus tenderness and frontal sinus tenderness.  Neck: Neck supple.  Cardiovascular: Normal rate, regular rhythm and normal heart sounds. Exam reveals no gallop and no friction rub.  No murmur heard. Pulmonary/Chest: Effort normal and breath sounds normal. No respiratory distress.  Lymphadenopathy:    He has no cervical adenopathy.  Neurological: He is alert and oriented to person, place, and time.  Skin: Skin is warm and dry.  Psychiatric: He has a normal mood and affect. His behavior is normal.   Vitals:   12/27/17 1130  BP: 127/74  Pulse: 79       Assessment & Plan:   Diagnoses and all orders for this visit:  Acute  non-recurrent pansinusitis -     amoxicillin-clavulanate (AUGMENTIN) 875-125 MG tablet; Take 1 tablet by mouth 2 (two) times daily. -     ipratropium (ATROVENT) 0.06 % nasal spray; Place 2 sprays into both nostrils 4 (four) times daily.  Cough -     benzonatate (TESSALON) 200 MG capsule; Take 1 capsule (200 mg total) by mouth 2 (two) times daily as needed for cough. -     ipratropium (ATROVENT) 0.06 % nasal spray; Place 2 sprays into both nostrils 4 (four) times daily. -     HYDROcodone-homatropine (HYCODAN) 5-1.5 MG/5ML syrup; Take 5 mLs by mouth every 12 (twelve) hours as needed.     Bryan Harvey was given an Production managereducational handout on sinusitis and given an antibiotic to help resolve his symptoms. He was also given Hycodan for nighttime use, Atrovent to be taken as directed, and tessalon pearls for symptomatic relief of his cough. He should stay hydrated and take his medications as directed. If he has questions, his symptoms worsen or do not improve he should call the office or message me on myChart for re-evaluation.

## 2018-01-26 ENCOUNTER — Other Ambulatory Visit: Payer: Self-pay | Admitting: Sports Medicine

## 2018-01-26 DIAGNOSIS — M5136 Other intervertebral disc degeneration, lumbar region: Secondary | ICD-10-CM

## 2018-02-13 ENCOUNTER — Other Ambulatory Visit: Payer: Self-pay | Admitting: Physician Assistant

## 2018-02-15 ENCOUNTER — Ambulatory Visit (INDEPENDENT_AMBULATORY_CARE_PROVIDER_SITE_OTHER): Payer: 59 | Admitting: Physician Assistant

## 2018-02-15 ENCOUNTER — Encounter: Payer: Self-pay | Admitting: Physician Assistant

## 2018-02-15 VITALS — BP 116/72 | HR 77 | Ht 76.0 in | Wt 291.0 lb

## 2018-02-15 DIAGNOSIS — B029 Zoster without complications: Secondary | ICD-10-CM | POA: Insufficient documentation

## 2018-02-15 MED ORDER — VALACYCLOVIR HCL 1 G PO TABS: 1000.0000 mg | ORAL_TABLET | Freq: Three times a day (TID) | ORAL | 0 refills | Status: AC

## 2018-02-15 MED ORDER — HYDROCODONE-ACETAMINOPHEN 5-325 MG PO TABS: 1.0000 | ORAL_TABLET | Freq: Three times a day (TID) | ORAL | 0 refills | Status: AC | PRN

## 2018-02-15 NOTE — Progress Notes (Signed)
   Subjective:    Patient ID: Bryan Harvey, male    DOB: 07/17/1966, 52 y.o.   MRN: [id number]  HPI Pt is a 52 yo male who presents to the clinic with blisters on his right index finger and tingling sensation from fingers all the way up right arm and right abdomen. No other rash or blisters. No fever, chills, n/v/d. Not tried anything to make better. Pain is rated 8/10. He noticed the "burning sensation of skin before the blisters". Never had shingles.   .. Active Ambulatory Problems    Diagnosis Date Noted  . Hyperlipidemia 01/19/2008  . OBESITY, NOS 08/30/2006  . GASTROESOPHAGEAL REFLUX, NO ESOPHAGITIS 08/30/2006  . HERNIA, HIATAL, NONCONGENITAL 08/30/2006  . Lumbar degenerative disc disease 12/28/2007  . 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 Eczema 01/24/2013  . Colon cancer screening 03/19/2014  . Family history of colon cancer 03/19/2014  . Rhabdomyolysis due to statin therapy 08/07/2014  . Osteoarthritis, hip, bilateral 10/07/2014  . Superficial thrombophlebitis of left leg 11/04/2014  . Left shoulder pain 02/04/2015  . Left cervical radiculopathy 03/04/2015  . Radiculopathy 11/17/2016  . Keratosis pilaris 05/08/2017  . Biliary colic 05/24/2017  . Subacute bronchitis 08/11/2017  . 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 Hypertriglyceridemia 12/14/2017   Resolved Ambulatory Problems    Diagnosis Date Noted  . BACK PAIN WITH RADICULOPATHY 08/19/2008  . Radiculopathy 11/01/2013   Past Medical History:  Diagnosis Date  . Back pain   . BACK PAIN WITH RADICULOPATHY 08/19/2008  . Childhood asthma   . DISC DISEASE, LUMBAR 12/28/2007  . GASTROESOPHAGEAL REFLUX, NO ESOPHAGITIS 08/30/2006  . GERD (gastroesophageal reflux disease)   . HERNIA, HIATAL, NONCONGENITAL 08/30/2006  . Hyperlipidemia   . HYPERLIPIDEMIA 01/19/2008  . PONV (postoperative nausea and vomiting)       Review of Systems See HPI>     Objective:   Physical Exam  Constitutional: He appears well-developed and  well-nourished.  HENT:  Head: Normocephalic and atraumatic.  Skin:  Right index finger cluster of vesicles with erythema and pain.   Psychiatric: He has a normal mood and affect. His behavior is normal.          Assessment & Plan:  Marland Kitchen.Marland Kitchen.Diagnoses and all orders for this visit:  Herpes zoster without complication -     HYDROcodone-acetaminophen (NORCO/VICODIN) 5-325 MG tablet; Take 1 tablet by mouth every 8 (eight) hours as needed for up to 5 days for moderate pain. -     valACYclovir (VALTREX) 1000 MG tablet; Take 1 tablet (1,000 mg total) by mouth 3 (three) times daily. For 7 days. -     Viral culture  appears consistent with shingles.  Will confirm with viral culture. Valtrex ordered. norco given for moderate/severe pain for next 5 days. [place] controlled substance database reviewed with no concerns. HO given. Follow up as needed.

## 2018-02-15 NOTE — Patient Instructions (Signed)

## 2018-02-16 ENCOUNTER — Other Ambulatory Visit: Payer: Self-pay | Admitting: *Deleted

## 2018-02-16 MED ORDER — COLESEVELAM HCL 625 MG PO TABS: 1875.0000 mg | ORAL_TABLET | Freq: Two times a day (BID) | ORAL | 1 refills | Status: AC

## 2018-02-21 LAB — VIRAL CULTURE VIRC
MICRO NUMBER:: 90383078
SPECIMEN QUALITY:: ADEQUATE

## 2018-02-21 NOTE — Progress Notes (Signed)
Culture confirmed shingles. How are you doing?

## 2018-02-25 ENCOUNTER — Other Ambulatory Visit: Payer: Self-pay | Admitting: Sports Medicine

## 2018-02-25 DIAGNOSIS — M5136 Other intervertebral disc degeneration, lumbar region: Secondary | ICD-10-CM

## 2018-03-09 ENCOUNTER — Ambulatory Visit (HOSPITAL_COMMUNITY): Admission: RE | Admit: 2018-03-09 | Payer: 59 | Source: Ambulatory Visit

## 2018-03-09 ENCOUNTER — Encounter: Payer: Self-pay | Admitting: Sports Medicine

## 2018-03-09 ENCOUNTER — Ambulatory Visit: Payer: 59 | Admitting: Sports Medicine

## 2018-03-09 DIAGNOSIS — M51369 Other intervertebral disc degeneration, lumbar region without mention of lumbar back pain or lower extremity pain: Secondary | ICD-10-CM

## 2018-03-09 DIAGNOSIS — M5136 Other intervertebral disc degeneration, lumbar region: Secondary | ICD-10-CM | POA: Diagnosis not present

## 2018-03-09 MED ORDER — KETOROLAC TROMETHAMINE 30 MG/ML IJ SOLN
30.0000 mg | Freq: Once | INTRAMUSCULAR | Status: AC
  Administered 2018-03-09: 30 mg via INTRAMUSCULAR

## 2018-03-09 MED ORDER — OXYCODONE-ACETAMINOPHEN 10-325 MG PO TABS
1.0000 | ORAL_TABLET | Freq: Three times a day (TID) | ORAL | 0 refills | Status: DC | PRN
Start: 2018-03-09 — End: 2018-03-16

## 2018-03-09 MED ORDER — METHYLPREDNISOLONE SODIUM SUCC 125 MG IJ SOLR
125.0000 mg | Freq: Once | INTRAMUSCULAR | Status: AC
  Administered 2018-03-09: 125 mg via INTRAMUSCULAR

## 2018-03-09 MED ORDER — PREDNISONE 50 MG PO TABS: ORAL_TABLET | ORAL | 0 refills | Status: DC

## 2018-03-09 NOTE — Assessment & Plan Note (Signed)
Status post L3-L5 lumbar fusion, worsening pain, continue gabapentin 600 mg 3 times per day, Toradol and Solu-Medrol today, oxycodone 10/325, 5 days of prednisone. He is having saddle numbness, slight urinary incontinence so we do need an MRI stat today.

## 2018-03-09 NOTE — Progress Notes (Signed)
Subjective:    CC: Low back pain  HPI: This is a pleasant 52 year old male, he does have a history of multilevel lumbar laminectomy, fusion from L3-L5.  He has good days, has bad days, occasionally with odd movements and ocular motions he will have flares in pain, currently he is having a severe increase in pain, worse when standing upright, radiation to the thigh with numbness in the groin, and occasional loss of urine.  No fevers or chills, no progressive weakness.  I reviewed the past medical history, family history, social history, surgical history, and allergies today and no changes were needed.  Please see the problem list section below in epic for further details.  Past Medical History: Past Medical History:  Diagnosis Date  . Back pain   . BACK PAIN WITH RADICULOPATHY 08/19/2008   Qualifier: Diagnosis of  By: Thomos Lemons    . Childhood asthma    no problems since the age of 5  . DISC DISEASE, LUMBAR 12/28/2007   Qualifier: Diagnosis of  By: Thomos Lemons    . GASTROESOPHAGEAL REFLUX, NO ESOPHAGITIS 08/30/2006   Qualifier: Diagnosis of  By: Thomos Lemons    . GERD (gastroesophageal reflux disease)   . HERNIA, HIATAL, NONCONGENITAL 08/30/2006   Qualifier: Diagnosis of  By: Thomos Lemons    . Hyperlipidemia   . HYPERLIPIDEMIA 01/19/2008   Qualifier: Diagnosis of  By: Thomos Lemons    . PONV (postoperative nausea and vomiting)    2003 surgery, not after last fusion surgery   Past Surgical History: Past Surgical History:  Procedure Laterality Date  . ANKLE ARTHROSCOPY Right 1998  . ANTERIOR LAT LUMBAR FUSION Right 11/17/2016   Procedure: RIGHT  SIDED LUMBAR 3-4 LATERAL INTERBODY FUSION WITH INSTRUMENTATION AND ALLOGRAFT;  Surgeon: Estill Bamberg, MD;  Location: [facility];  Service: Orthopedics;  Laterality: Right;  LEFT SIDED LUMBAR 3-4 LATERAL INTERBODY FUSION WITH INSTRUMENTATION AND ALLOGRAFT  . BACK SURGERY    . CHOLECYSTECTOMY N/A 06/30/2017   Procedure: LAPAROSCOPIC  CHOLECYSTECTOMY;  Surgeon: Gaynelle Adu, MD;  Location: [facility];  Service: General;  Laterality: N/A;  . KNEE CARTILAGE SURGERY Left 1994  . SHOULDER ARTHROSCOPY Left    torn labrum  . TONSILLECTOMY     Social History: Social History   Socioeconomic History  . Marital status: Married    Spouse name: Not on file  . Number of children: Not on file  . Years of education: Not on file  . Highest education level: Not on file  Occupational History  . Not on file  Social Needs  . Financial resource strain: Not on file  . Food insecurity:    Worry: Not on file    Inability: Not on file  . Transportation needs:    Medical: Not on file    Non-medical: Not on file  Tobacco Use  . Smoking status: Never Smoker  . Smokeless tobacco: Former Neurosurgeon    Types: Snuff  Substance and Sexual Activity  . Alcohol use: Yes    Comment: social, a couple beers a month  . Drug use: No  . Sexual activity: Yes  Lifestyle  . Physical activity:    Days per week: Not on file    Minutes per session: Not on file  . Stress: Not on file  Relationships  . Social connections:    Talks on phone: Not on file    Gets together: Not on file    Attends religious service: Not on  file    Active member of club or organization: Not on file    Attends meetings of clubs or organizations: Not on file    Relationship status: Not on file  Other Topics Concern  . Not on file  Social History Narrative  . Not on file   Family History: Family History  Problem Relation Age of Onset  . Lymphoma Father   . Diabetes Mother    Allergies: Allergies  Allergen Reactions  . Statins     Muscle breakdown/cramping all over  (lipitor)   Medications: See med rec.  Review of Systems: No fevers, chills, night sweats, weight loss, chest pain, or shortness of breath.   Objective:    General: Well Developed, well nourished, and in no acute distress.  Neuro: Alert and oriented x3, extra-ocular muscles intact, sensation grossly  intact.  HEENT: Normocephalic, atraumatic, pupils equal round reactive to light, neck supple, no masses, no lymphadenopathy, thyroid nonpalpable.  Skin: Warm and dry, no rashes. Cardiac: Regular rate and rhythm, no murmurs rubs or gallops, no lower extremity edema.  Respiratory: Clear to auscultation bilaterally. Not using accessory muscles, speaking in full sentences. Back Exam:  Inspection: Unremarkable  Motion: Somewhat limited in extension due to severe pain. SLR laying: Negative  XSLR laying: Negative  Palpable tenderness: None. FABER: negative. Sensory change: Gross sensation intact to all lumbar and sacral dermatomes.  Reflexes: 2+ at both patellar tendons, 2+ at achilles tendons, Babinski's downgoing.  Strength at foot  Plantar-flexion: 5/5 Dorsi-flexion: 5/5 Eversion: 5/5 Inversion: 5/5  Leg strength  Quad: 5/5 Hamstring: 5/5 Hip flexor: 5/5 Hip abductors: 5/5  Gait unremarkable.  Toradol 30, Solu-Medrol 125 mg given intramuscular today  Impression and Recommendations:    Lumbar degenerative disc disease Status post L3-L5 lumbar fusion, worsening pain, continue gabapentin 600 mg 3 times per day, Toradol and Solu-Medrol today, oxycodone 10/325, 5 days of prednisone. He is having saddle numbness, slight urinary incontinence so we do need an MRI stat today.  I spent 25 minutes with this patient, greater than 50% was face-to-face time counseling regarding the above diagnoses ___________________________________________ Ihor Austinhomas J. Benjamin Stainhekkekandam, M.D., ABFM., CAQSM. Primary Care and Sports Medicine  [facility]  Adjunct Instructor of Family Medicine  [employer]

## 2018-03-13 ENCOUNTER — Encounter: Payer: Self-pay | Admitting: Sports Medicine

## 2018-03-16 ENCOUNTER — Telehealth: Payer: Self-pay

## 2018-03-16 DIAGNOSIS — M51369 Other intervertebral disc degeneration, lumbar region without mention of lumbar back pain or lower extremity pain: Secondary | ICD-10-CM

## 2018-03-16 DIAGNOSIS — M5136 Other intervertebral disc degeneration, lumbar region: Secondary | ICD-10-CM

## 2018-03-16 MED ORDER — OXYCODONE-ACETAMINOPHEN 10-325 MG PO TABS: 1.0000 | ORAL_TABLET | Freq: Three times a day (TID) | ORAL | 0 refills | Status: DC | PRN

## 2018-03-16 NOTE — Telephone Encounter (Signed)
Pt would like a refill of Oxy. States he's going on a road trip and doesn't think he's going to be able to handle the discomfort. Please assist.

## 2018-03-16 NOTE — Telephone Encounter (Signed)
Rx sent in

## 2018-03-20 ENCOUNTER — Other Ambulatory Visit: Payer: 59

## 2018-03-23 ENCOUNTER — Ambulatory Visit: Payer: 59 | Admitting: Sports Medicine

## 2018-03-25 ENCOUNTER — Ambulatory Visit (HOSPITAL_COMMUNITY)
Admission: RE | Admit: 2018-03-25 | Discharge: 2018-03-25 | Disposition: A | Discharge status: Home / Self Care | Payer: 59 | Source: Ambulatory Visit | Attending: Sports Medicine | Admitting: Sports Medicine

## 2018-03-25 DIAGNOSIS — M51369 Other intervertebral disc degeneration, lumbar region without mention of lumbar back pain or lower extremity pain: Secondary | ICD-10-CM

## 2018-03-25 DIAGNOSIS — M5136 Other intervertebral disc degeneration, lumbar region: Secondary | ICD-10-CM | POA: Diagnosis present

## 2018-03-25 DIAGNOSIS — Z981 Arthrodesis status: Secondary | ICD-10-CM | POA: Insufficient documentation

## 2018-03-25 DIAGNOSIS — G031 Chronic meningitis: Secondary | ICD-10-CM | POA: Insufficient documentation

## 2018-03-25 DIAGNOSIS — M6250 Muscle wasting and atrophy, not elsewhere classified, unspecified site: Secondary | ICD-10-CM | POA: Diagnosis not present

## 2018-03-28 ENCOUNTER — Ambulatory Visit: Payer: 59 | Admitting: Sports Medicine

## 2018-03-28 ENCOUNTER — Encounter: Payer: Self-pay | Admitting: Sports Medicine

## 2018-03-28 DIAGNOSIS — M5416 Radiculopathy, lumbar region: Secondary | ICD-10-CM

## 2018-03-28 DIAGNOSIS — G894 Chronic pain syndrome: Secondary | ICD-10-CM | POA: Diagnosis not present

## 2018-03-28 DIAGNOSIS — M5136 Other intervertebral disc degeneration, lumbar region: Secondary | ICD-10-CM

## 2018-03-28 DIAGNOSIS — G039 Meningitis, unspecified: Secondary | ICD-10-CM

## 2018-03-28 DIAGNOSIS — M160 Bilateral primary osteoarthritis of hip: Secondary | ICD-10-CM | POA: Diagnosis not present

## 2018-03-28 DIAGNOSIS — M51369 Other intervertebral disc degeneration, lumbar region without mention of lumbar back pain or lower extremity pain: Secondary | ICD-10-CM

## 2018-03-28 MED ORDER — DULOXETINE HCL 60 MG PO CPEP: 60.0000 mg | ORAL_CAPSULE | Freq: Every day | ORAL | 3 refills | Status: DC

## 2018-03-28 NOTE — Assessment & Plan Note (Signed)
L3-L5 fusion is stable on MRI. Continues with gabapentin 600 mg up to 3 times per day. Severe back pain has improved with Solu-Medrol, prednisone. He is still having some groin and buttock pain, but I think this is coming from his hip joint.

## 2018-03-28 NOTE — Progress Notes (Signed)
Subjective:    CC: Follow-up  HPI: Shoua returns, he has an L3-L5 fusion, he had some increasing and back pain we added a burst of steroids and some oxycodone and he has returned today with good resolution of his back pain.  Unfortunately he has a recurrence of pain, severe, persistent, localized in the right groin, right buttock, worse with weightbearing.  He does have a known history of hip osteoarthritis, previous injection into the right hip joint was in 2015 in the left hip joint in 2016.  He also has significant paresthesias down into the legs which are baseline for him.  I reviewed the past medical history, family history, social history, surgical history, and allergies today and no changes were needed.  Please see the problem list section below in epic for further details.  Past Medical History: Past Medical History:  Diagnosis Date  . Back pain   . BACK PAIN WITH RADICULOPATHY 08/19/2008   Qualifier: Diagnosis of  By: Thomos Lemons    . Childhood asthma    no problems since the age of 31  . DISC DISEASE, LUMBAR 12/28/2007   Qualifier: Diagnosis of  By: Thomos Lemons    . GASTROESOPHAGEAL REFLUX, NO ESOPHAGITIS 08/30/2006   Qualifier: Diagnosis of  By: Thomos Lemons    . GERD (gastroesophageal reflux disease)   . HERNIA, HIATAL, NONCONGENITAL 08/30/2006   Qualifier: Diagnosis of  By: Thomos Lemons    . Hyperlipidemia   . HYPERLIPIDEMIA 01/19/2008   Qualifier: Diagnosis of  By: Thomos Lemons    . PONV (postoperative nausea and vomiting)    2003 surgery, not after last fusion surgery   Past Surgical History: Past Surgical History:  Procedure Laterality Date  . ANKLE ARTHROSCOPY Right 1998  . ANTERIOR LAT LUMBAR FUSION Right 11/17/2016   Procedure: RIGHT  SIDED LUMBAR 3-4 LATERAL INTERBODY FUSION WITH INSTRUMENTATION AND ALLOGRAFT;  Surgeon: Estill Bamberg, MD;  Location: [facility];  Service: Orthopedics;  Laterality: Right;  LEFT SIDED LUMBAR 3-4 LATERAL INTERBODY  FUSION WITH INSTRUMENTATION AND ALLOGRAFT  . BACK SURGERY    . CHOLECYSTECTOMY N/A 06/30/2017   Procedure: LAPAROSCOPIC CHOLECYSTECTOMY;  Surgeon: Gaynelle Adu, MD;  Location: [facility];  Service: General;  Laterality: N/A;  . KNEE CARTILAGE SURGERY Left 1994  . SHOULDER ARTHROSCOPY Left    torn labrum  . TONSILLECTOMY     Social History: Social History   Socioeconomic History  . Marital status: Married    Spouse name: Not on file  . Number of children: Not on file  . Years of education: Not on file  . Highest education level: Not on file  Occupational History  . Not on file  Social Needs  . Financial resource strain: Not on file  . Food insecurity:    Worry: Not on file    Inability: Not on file  . Transportation needs:    Medical: Not on file    Non-medical: Not on file  Tobacco Use  . Smoking status: Never Smoker  . Smokeless tobacco: Former Neurosurgeon    Types: Snuff  Substance and Sexual Activity  . Alcohol use: Yes    Comment: social, a couple beers a month  . Drug use: No  . Sexual activity: Yes  Lifestyle  . Physical activity:    Days per week: Not on file    Minutes per session: Not on file  . Stress: Not on file  Relationships  . Social connections:    Talks  on phone: Not on file    Gets together: Not on file    Attends religious service: Not on file    Active member of club or organization: Not on file    Attends meetings of clubs or organizations: Not on file    Relationship status: Not on file  Other Topics Concern  . Not on file  Social History Narrative  . Not on file   Family History: Family History  Problem Relation Age of Onset  . Lymphoma Father   . Diabetes Mother    Allergies: Allergies  Allergen Reactions  . Statins     Muscle breakdown/cramping all over  (lipitor)   Medications: See med rec.  Review of Systems: No fevers, chills, night sweats, weight loss, chest pain, or shortness of breath.   Objective:    General: Well Developed,  well nourished, and in no acute distress.  Neuro: Alert and oriented x3, extra-ocular muscles intact, sensation grossly intact.  HEENT: Normocephalic, atraumatic, pupils equal round reactive to light, neck supple, no masses, no lymphadenopathy, thyroid nonpalpable.  Skin: Warm and dry, no rashes. Cardiac: Regular rate and rhythm, no murmurs rubs or gallops, no lower extremity edema.  Respiratory: Clear to auscultation bilaterally. Not using accessory muscles, speaking in full sentences. Right hip: ROM IR: 30 degrees and with reproduction of pain, ER: 60 Deg, Flexion: 120 Deg, Extension: 100 Deg, Abduction: 45 Deg, Adduction: 45 Deg Strength IR: 5/5, ER: 5/5, Flexion: 5/5, Extension: 5/5, Abduction: 5/5, Adduction: 5/5 Pelvic alignment unremarkable to inspection and palpation. Standing hip rotation and gait without trendelenburg / unsteadiness. Greater trochanter without tenderness to palpation. No tenderness over piriformis. No SI joint tenderness and normal minimal SI movement.  Impression and Recommendations:    Lumbar degenerative disc disease L3-L5 fusion is stable on MRI. Continues with gabapentin 600 mg up to 3 times per day. Severe back pain has improved with Solu-Medrol, prednisone. He is still having some groin and buttock pain, but I think this is coming from his hip joint.  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  Osteoarthritis, hip, bilateral Pain now is persistent in the right hip and referrable to the joint. We injected his left hip in 2016 and we injected his right hip back in 2015. He will do the Cymbalta for a month and if insufficient relief he can come back and I will inject his right hip joint again. ___________________________________________ Ihor Austin. Benjamin Stain, M.D., ABFM., CAQSM. Primary Care and Sports Medicine [facility]  [facility]  Adjunct Instructor of Family Medicine  [employer]

## 2018-03-28 NOTE — Assessment & Plan Note (Signed)
Pain now is persistent in the right hip and referrable to the joint. We injected his left hip in 2016 and we injected his right hip back in 2015. He will do the Cymbalta for a month and if insufficient relief he can come back and I will inject his right hip joint again.

## 2018-03-28 NOTE — Assessment & Plan Note (Signed)
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

## 2018-03-31 ENCOUNTER — Telehealth: Payer: Self-pay

## 2018-03-31 DIAGNOSIS — M51369 Other intervertebral disc degeneration, lumbar region without mention of lumbar back pain or lower extremity pain: Secondary | ICD-10-CM

## 2018-03-31 DIAGNOSIS — M5136 Other intervertebral disc degeneration, lumbar region: Secondary | ICD-10-CM

## 2018-03-31 MED ORDER — OXYCODONE-ACETAMINOPHEN 10-325 MG PO TABS: 1.0000 | ORAL_TABLET | Freq: Three times a day (TID) | ORAL | 0 refills | Status: DC | PRN

## 2018-03-31 NOTE — Telephone Encounter (Signed)
Pt left VM stating his pain has gotten really bad and is now going down to below his knees. Would like to know if he can have a refill of Percocet. Please advise.

## 2018-03-31 NOTE — Telephone Encounter (Signed)
Done, but next step is pain management clinic if needs another refill.

## 2018-04-04 ENCOUNTER — Other Ambulatory Visit: Payer: Self-pay | Admitting: Physician Assistant

## 2018-04-14 ENCOUNTER — Telehealth: Payer: Self-pay

## 2018-04-14 DIAGNOSIS — M51369 Other intervertebral disc degeneration, lumbar region without mention of lumbar back pain or lower extremity pain: Secondary | ICD-10-CM

## 2018-04-14 DIAGNOSIS — M5136 Other intervertebral disc degeneration, lumbar region: Secondary | ICD-10-CM

## 2018-04-14 MED ORDER — OXYCODONE-ACETAMINOPHEN 10-325 MG PO TABS: 1.0000 | ORAL_TABLET | Freq: Three times a day (TID) | ORAL | 0 refills | Status: DC | PRN

## 2018-04-14 NOTE — Telephone Encounter (Signed)
Pt is requesting another refill of pain medication. Please advise.

## 2018-04-14 NOTE — Telephone Encounter (Signed)
Done

## 2018-04-25 ENCOUNTER — Ambulatory Visit (INDEPENDENT_AMBULATORY_CARE_PROVIDER_SITE_OTHER): Payer: 59 | Admitting: Sports Medicine

## 2018-04-25 ENCOUNTER — Encounter: Payer: Self-pay | Admitting: Sports Medicine

## 2018-04-25 DIAGNOSIS — M160 Bilateral primary osteoarthritis of hip: Secondary | ICD-10-CM | POA: Diagnosis not present

## 2018-04-25 DIAGNOSIS — M51369 Other intervertebral disc degeneration, lumbar region without mention of lumbar back pain or lower extremity pain: Secondary | ICD-10-CM

## 2018-04-25 DIAGNOSIS — M5136 Other intervertebral disc degeneration, lumbar region: Secondary | ICD-10-CM

## 2018-04-25 MED ORDER — OXYCODONE-ACETAMINOPHEN 10-325 MG PO TABS: 1.0000 | ORAL_TABLET | Freq: Three times a day (TID) | ORAL | 0 refills | Status: DC | PRN

## 2018-04-25 NOTE — Assessment & Plan Note (Signed)
Some of his right-sided pain is referrable to the hip joint. I have not injected his right hip and since 2015, the worst pain is in the back so we are going to try the SI joint first, and then target the right hip joint at the next visit if still insufficient relief.

## 2018-04-25 NOTE — Progress Notes (Signed)
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  Some pain deep into the right groin as well.  Moderate, persistent.  I reviewed the past medical history, family history, social history, surgical history, and allergies today and no changes were needed.  Please see the problem list section below in epic for further details.  Past Medical History: Past Medical History:  Diagnosis Date  . Back pain   . BACK PAIN WITH RADICULOPATHY 08/19/2008   Qualifier: Diagnosis of  By: Thomos Lemons    . Childhood asthma    no problems since the age of 11  . DISC DISEASE, LUMBAR 12/28/2007   Qualifier: Diagnosis of  By: Thomos Lemons    . GASTROESOPHAGEAL REFLUX, NO ESOPHAGITIS 08/30/2006   Qualifier: Diagnosis of  By: Thomos Lemons    . GERD (gastroesophageal reflux disease)   . HERNIA, HIATAL, NONCONGENITAL 08/30/2006   Qualifier: Diagnosis of  By: Thomos Lemons    . Hyperlipidemia   . HYPERLIPIDEMIA 01/19/2008   Qualifier: Diagnosis of  By: Thomos Lemons    . PONV (postoperative nausea and vomiting)    2003 surgery, not after last fusion surgery   Past Surgical History: Past Surgical History:  Procedure Laterality Date  . ANKLE ARTHROSCOPY Right 1998  . ANTERIOR LAT LUMBAR FUSION Right 11/17/2016   Procedure: RIGHT  SIDED LUMBAR 3-4 LATERAL INTERBODY FUSION WITH INSTRUMENTATION AND ALLOGRAFT;  Surgeon: Estill Bamberg, MD;  Location: [facility];  Service: Orthopedics;  Laterality: Right;  LEFT SIDED LUMBAR 3-4 LATERAL INTERBODY FUSION WITH INSTRUMENTATION AND ALLOGRAFT  . BACK SURGERY    . CHOLECYSTECTOMY N/A 06/30/2017   Procedure: LAPAROSCOPIC CHOLECYSTECTOMY;  Surgeon: Gaynelle Adu, MD;  Location: [facility];  Service: General;  Laterality: N/A;  . KNEE CARTILAGE SURGERY Left  1994  . SHOULDER ARTHROSCOPY Left    torn labrum  . TONSILLECTOMY     Social History: Social History   Socioeconomic History  . Marital status: Married    Spouse name: Not on file  . Number of children: Not on file  . Years of education: Not on file  . Highest education level: Not on file  Occupational History  . Not on file  Social Needs  . Financial resource strain: Not on file  . Food insecurity:    Worry: Not on file    Inability: Not on file  . Transportation needs:    Medical: Not on file    Non-medical: Not on file  Tobacco Use  . Smoking status: Never Smoker  . Smokeless tobacco: Former Neurosurgeon    Types: Snuff  Substance and Sexual Activity  . Alcohol use: Yes    Comment: social, a couple beers a month  . Drug use: No  . Sexual activity: Yes  Lifestyle  . Physical activity:    Days per week: Not on file    Minutes per session: Not on file  . Stress: Not on file  Relationships  . Social connections:    Talks on phone: Not on file    Gets together: Not on file    Attends religious service: Not on file    Active member of club or organization: Not on file    Attends meetings of clubs or organizations: Not  on file    Relationship status: Not on file  Other Topics Concern  . Not on file  Social History Narrative  . Not on file   Family History: Family History  Problem Relation Age of Onset  . Lymphoma Father   . Diabetes Mother    Allergies: Allergies  Allergen Reactions  . Statins     Muscle breakdown/cramping all over  (lipitor)   Medications: See med rec.  Review of Systems: No fevers, chills, night sweats, weight loss, chest pain, or shortness of breath.   Objective:    General: Well Developed, well nourished, and in no acute distress.  Neuro: Alert and oriented x3, extra-ocular muscles intact, sensation grossly intact.  HEENT: Normocephalic, atraumatic, pupils equal round reactive to light, neck supple, no masses, no lymphadenopathy,  thyroid nonpalpable.  Skin: Warm and dry, no rashes. Cardiac: Regular rate and rhythm, no murmurs rubs or gallops, no lower extremity edema.  Respiratory: Clear to auscultation bilaterally. Not using accessory muscles, speaking in full sentences. Back Exam:  Inspection: Unremarkable  Motion: Flexion 45 deg, Extension 45 deg, Side Bending to 45 deg bilaterally,  Rotation to 45 deg bilaterally  SLR laying: Negative  XSLR laying: Negative  Palpable tenderness: Right sacroiliac joint. FABER: negative. Sensory change: Gross sensation intact to all lumbar and sacral dermatomes.  Reflexes: 2+ at both patellar tendons, 2+ at achilles tendons, Babinski's downgoing.  Strength at foot  Plantar-flexion: 5/5 Dorsi-flexion: 5/5 Eversion: 5/5 Inversion: 5/5  Leg strength  Quad: 5/5 Hamstring: 5/5 Hip flexor: 5/5 Hip abductors: 5/5  Gait unremarkable. Right hip: ROM IR: 60 Deg reproduces some pain in the groin but not the back pain, ER: 60 Deg, Flexion: 120 Deg, Extension: 100 Deg, Abduction: 45 Deg, Adduction: 45 Deg Strength IR: 5/5, ER: 5/5, Flexion: 5/5, Extension: 5/5, Abduction: 5/5, Adduction: 5/5 Pelvic alignment unremarkable to inspection and palpation. Standing hip rotation and gait without trendelenburg / unsteadiness. Greater trochanter without tenderness to palpation. No tenderness over piriformis. No SI joint tenderness and normal minimal SI movement.  Procedure: Real-time Ultrasound Guided Injection of right sacroiliac joint Device: GE Logiq E  Verbal informed consent obtained.  Time-out conducted.  Noted no overlying erythema, induration, or other signs of local infection.  Skin prepped in a sterile fashion.  Local anesthesia: Topical Ethyl chloride.  With sterile technique and under real time ultrasound guidance: I advanced needle into the joint, patient did feel concordant pain during the injection, I placed a total of 1 cc kenalog 40, 2 cc bupivacaine, 2 cc lidocaine  injected. Completed without difficulty  Pain immediately resolved suggesting accurate placement of the medication.  Advised to call if fevers/chills, erythema, induration, drainage, or persistent bleeding.  Images permanently stored and available for review in the ultrasound unit.  Impression: Technically successful ultrasound guided injection.  Impression and Recommendations:    Lumbar degenerative disc disease L3-L5 fusion stable and without complications on MRI. Gabapentin 600 mg 3 times per day. Pain today is at the right sacroiliac joint, this is injected. He does have some signs of arachnoiditis on MRI. We started Cymbalta as well without much improvement. I would like assistance from pain management. Return to see me in 4 weeks to evaluate relief from right ultrasound-guided sacroiliac joint injection, he would be a candidate for radio frequency ablation if he gets good relief.  Osteoarthritis, hip, bilateral Some of his right-sided pain is referrable to the hip joint. I have not injected his right hip and since 2015, the worst  pain is in the back so we are going to try the SI joint first, and then target the right hip joint at the next visit if still insufficient relief.  ___________________________________________ Ihor Austinhomas J. Benjamin Stainhekkekandam, M.D., ABFM., CAQSM. Primary Care and Sports Medicine [place] [facility]  Adjunct Instructor of Family Medicine  [employer]

## 2018-04-25 NOTE — Assessment & Plan Note (Signed)
L3-L5 fusion stable and without complications on MRI. Gabapentin 600 mg 3 times per day. Pain today is at the right sacroiliac joint, this is injected. He does have some signs of arachnoiditis on MRI. We started Cymbalta as well without much improvement. I would like assistance from pain management. Return to see me in 4 weeks to evaluate relief from right ultrasound-guided sacroiliac joint injection, he would be a candidate for radio frequency ablation if he gets good relief.

## 2018-04-27 ENCOUNTER — Encounter: Payer: Self-pay | Admitting: Sports Medicine

## 2018-05-05 ENCOUNTER — Telehealth: Payer: Self-pay

## 2018-05-05 DIAGNOSIS — M5136 Other intervertebral disc degeneration, lumbar region: Secondary | ICD-10-CM

## 2018-05-05 DIAGNOSIS — M51369 Other intervertebral disc degeneration, lumbar region without mention of lumbar back pain or lower extremity pain: Secondary | ICD-10-CM

## 2018-05-05 MED ORDER — OXYCODONE-ACETAMINOPHEN 10-325 MG PO TABS: 1.0000 | ORAL_TABLET | Freq: Three times a day (TID) | ORAL | 0 refills | Status: DC | PRN

## 2018-05-05 NOTE — Telephone Encounter (Signed)
10 more days given, we will continue to work on trying to contact them, and he should try as well.

## 2018-05-05 NOTE — Telephone Encounter (Signed)
Pt left voicemail stating that he needs refill on percocet. He also stated that he hasn't heard from [facility] and that he will contact them. He stated that he is getting no better but worse. W.Milia Warth, CCMA

## 2018-05-06 ENCOUNTER — Other Ambulatory Visit: Payer: Self-pay | Admitting: Sports Medicine

## 2018-05-06 DIAGNOSIS — M5136 Other intervertebral disc degeneration, lumbar region: Secondary | ICD-10-CM

## 2018-05-06 DIAGNOSIS — M5416 Radiculopathy, lumbar region: Secondary | ICD-10-CM

## 2018-05-06 DIAGNOSIS — G894 Chronic pain syndrome: Secondary | ICD-10-CM

## 2018-05-06 DIAGNOSIS — G039 Meningitis, unspecified: Secondary | ICD-10-CM

## 2018-05-17 ENCOUNTER — Other Ambulatory Visit: Payer: Self-pay | Admitting: *Deleted

## 2018-05-17 DIAGNOSIS — M5136 Other intervertebral disc degeneration, lumbar region: Secondary | ICD-10-CM

## 2018-05-17 MED ORDER — OXYCODONE-ACETAMINOPHEN 10-325 MG PO TABS: 1.0000 | ORAL_TABLET | Freq: Three times a day (TID) | ORAL | 0 refills | Status: AC | PRN

## 2018-05-17 NOTE — Telephone Encounter (Signed)
Pt left vm stating that he was finally able to get an appt with the pain clinic for July 10th but needs a refill of his Percocet until then.

## 2018-05-23 ENCOUNTER — Ambulatory Visit: Payer: 59 | Admitting: Sports Medicine

## 2018-06-24 ENCOUNTER — Other Ambulatory Visit: Payer: Self-pay | Admitting: Physician Assistant

## 2018-06-27 ENCOUNTER — Other Ambulatory Visit: Payer: Self-pay | Admitting: *Deleted

## 2018-06-27 DIAGNOSIS — G4726 Circadian rhythm sleep disorder, shift work type: Secondary | ICD-10-CM

## 2018-06-27 MED ORDER — ARMODAFINIL 200 MG PO TABS: 1.0000 | ORAL_TABLET | Freq: Every day | ORAL | 0 refills | Status: AC

## 2018-06-27 NOTE — Telephone Encounter (Signed)
Sent to Dr. Linford ArnoldMetheney for signature.Marland Kitchen.Marland Kitchen.Heath GoldBarkley, [employer] Lynetta, CMA

## 2019-01-20 IMAGING — DX DG CHEST 2V
2 series · 2 of 2 positions shown · non-contrast
Comparison: Chest x-ray of 11/08/2016

CLINICAL DATA: Persistent cough and shortness of breath for 10 days

EXAM:
CHEST  2 VIEW

[chest pa]
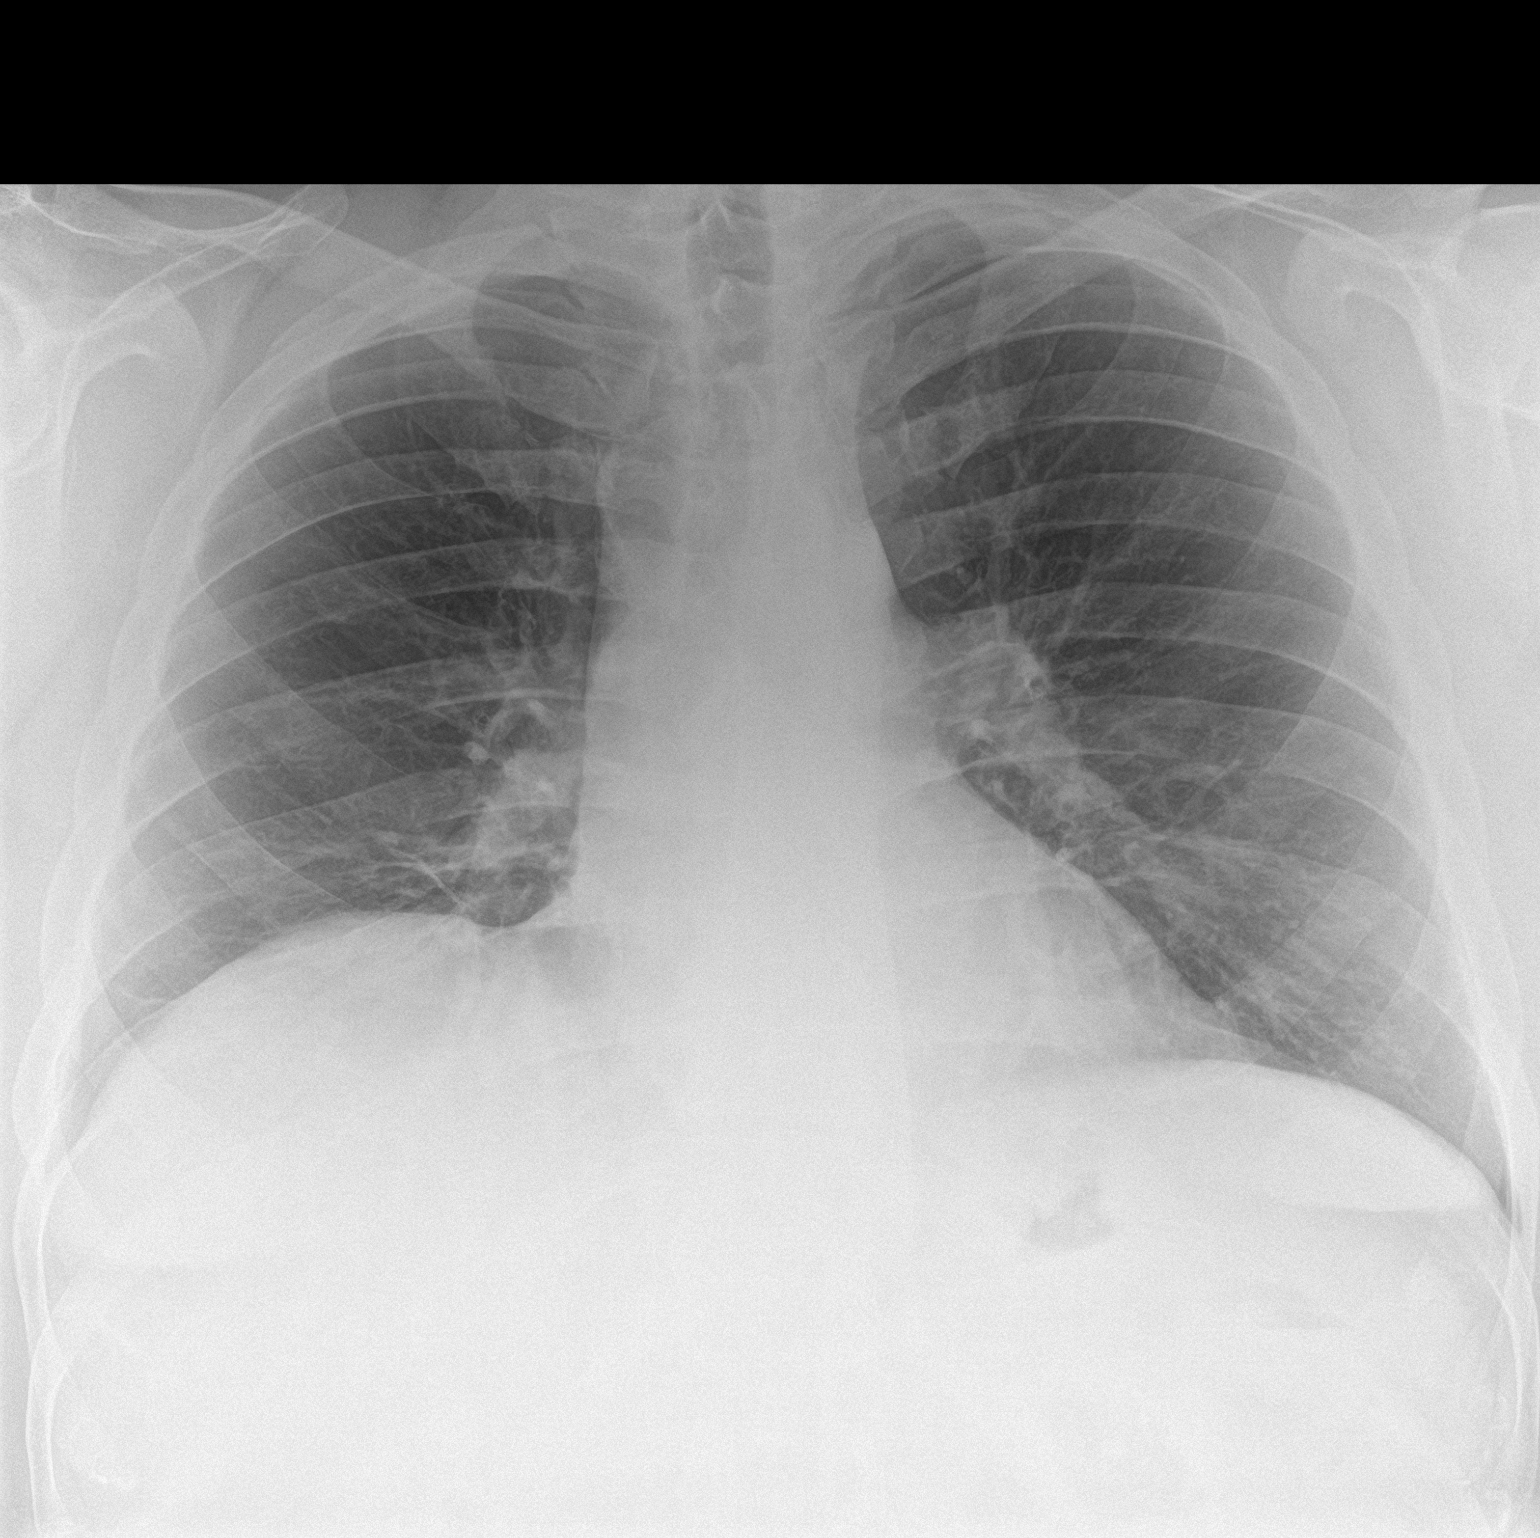

[chest lat]
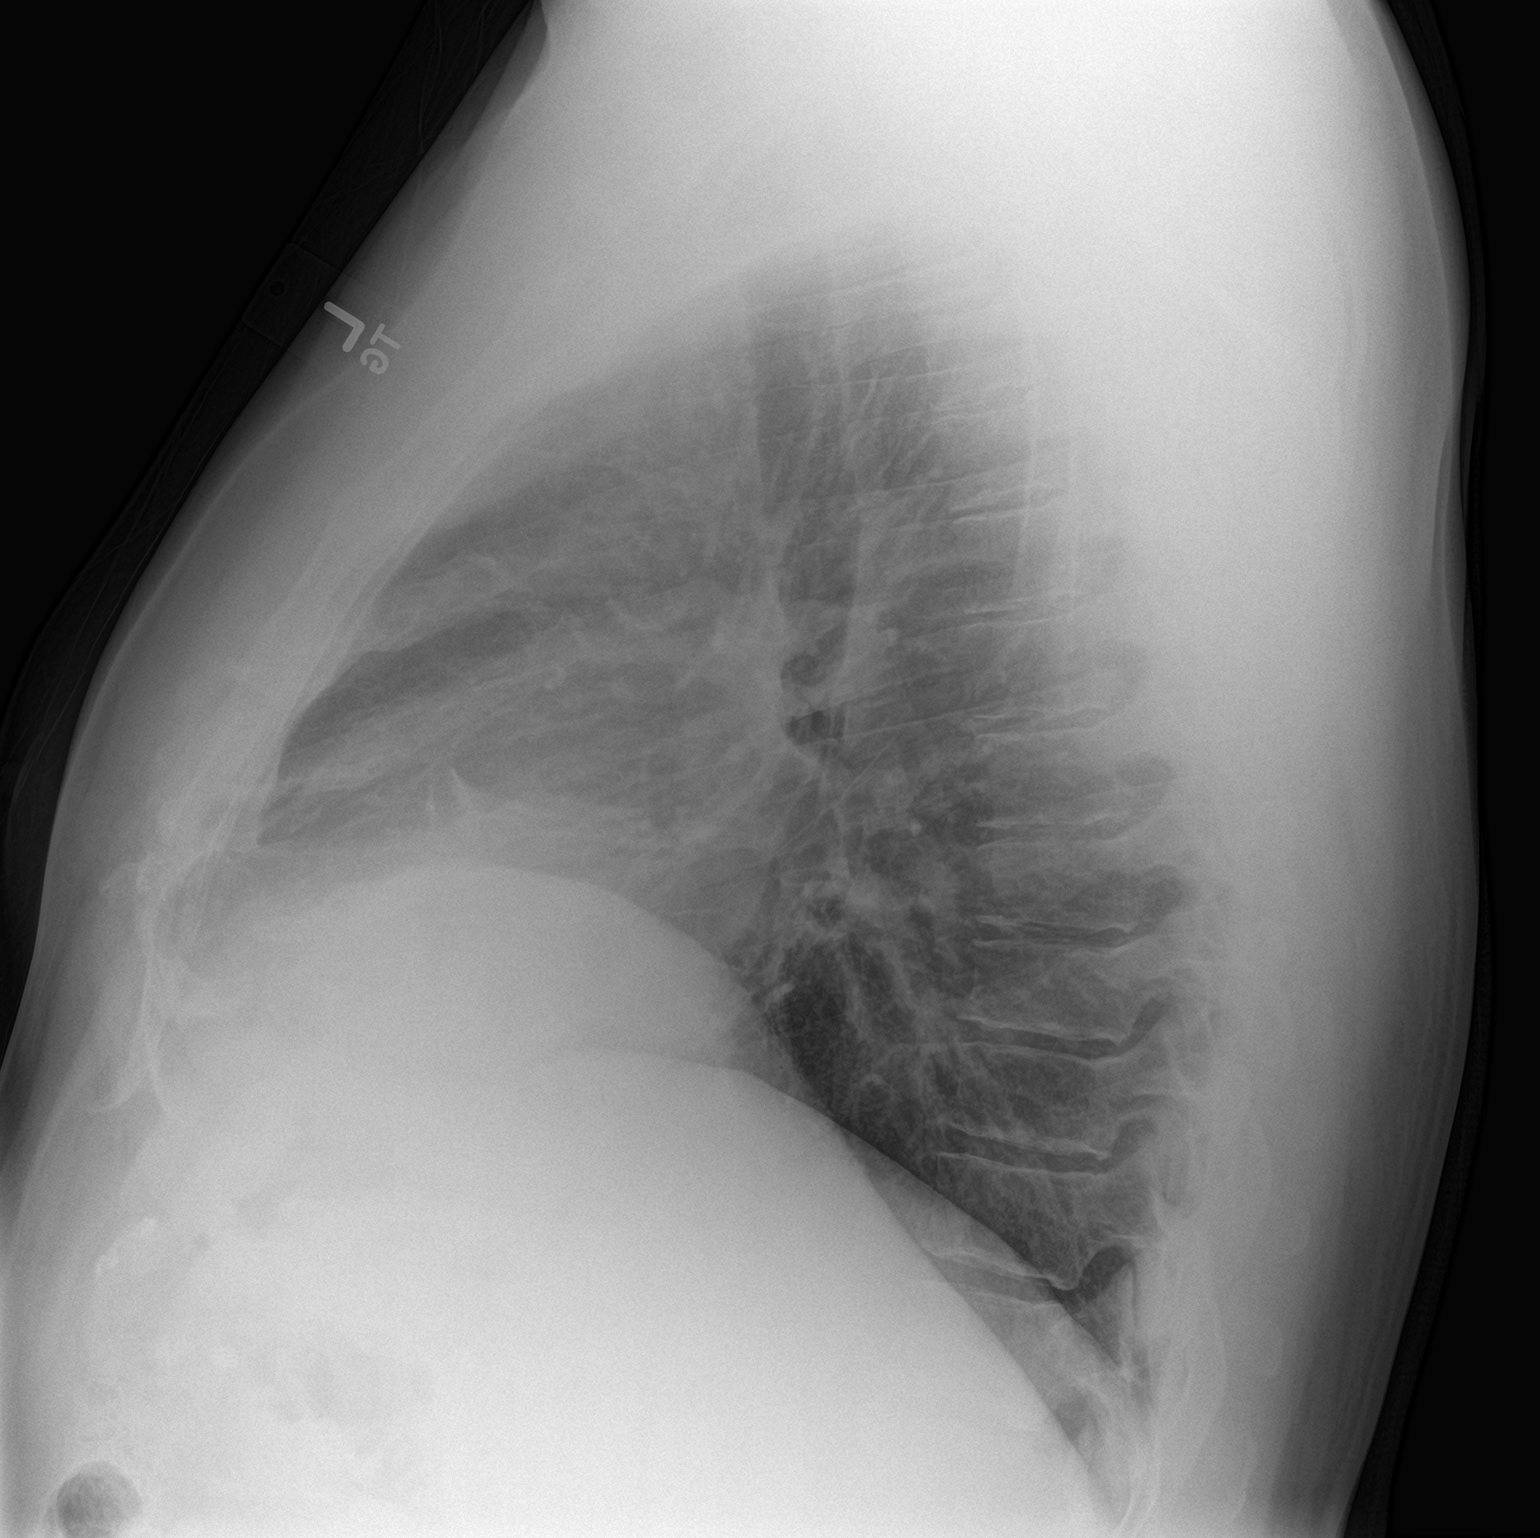

[2 of 2 positions shown; findings below may reference images not displayed]

FINDINGS: Linear atelectasis or scarring remains primarily in the right middle
lobe. No pneumonia or effusion is seen. There is some peribronchial
thickening which may be seen with bronchitis. The heart is within
normal limits in size. No bony abnormality is seen.
IMPRESSION: Stable linear scarring primarily in the right middle lobe. Mild
peribronchial thickening may indicate bronchitis.
